# Patient Record
Sex: Male | Born: 1950 | Race: White | Hispanic: No | Marital: Married | Smoking: Never smoker
Health system: Southern US, Community
[De-identification: ages and names within clinical notes are randomized; demographics above are authoritative.]

## PROBLEM LIST (undated history)

## (undated) DIAGNOSIS — E785 Hyperlipidemia, unspecified: Secondary | ICD-10-CM

## (undated) DIAGNOSIS — E119 Type 2 diabetes mellitus without complications: Secondary | ICD-10-CM

## (undated) DIAGNOSIS — H269 Unspecified cataract: Secondary | ICD-10-CM

## (undated) DIAGNOSIS — K219 Gastro-esophageal reflux disease without esophagitis: Secondary | ICD-10-CM

## (undated) DIAGNOSIS — I2510 Atherosclerotic heart disease of native coronary artery without angina pectoris: Secondary | ICD-10-CM

## (undated) DIAGNOSIS — K222 Esophageal obstruction: Secondary | ICD-10-CM

## (undated) DIAGNOSIS — C801 Malignant (primary) neoplasm, unspecified: Secondary | ICD-10-CM

## (undated) DIAGNOSIS — I10 Essential (primary) hypertension: Secondary | ICD-10-CM

## (undated) DIAGNOSIS — J45909 Unspecified asthma, uncomplicated: Secondary | ICD-10-CM

## (undated) DIAGNOSIS — K228 Other specified diseases of esophagus: Secondary | ICD-10-CM

## (undated) DIAGNOSIS — Z8719 Personal history of other diseases of the digestive system: Secondary | ICD-10-CM

## (undated) HISTORY — DX: Essential (primary) hypertension: I10

## (undated) HISTORY — PX: UPPER GASTROINTESTINAL ENDOSCOPY: SHX188

## (undated) HISTORY — PX: SKIN CANCER EXCISION: SHX779

## (undated) HISTORY — DX: Type 2 diabetes mellitus without complications: E11.9

## (undated) HISTORY — DX: Personal history of other diseases of the digestive system: Z87.19

## (undated) HISTORY — DX: Malignant (primary) neoplasm, unspecified: C80.1

## (undated) HISTORY — DX: Esophageal obstruction: K22.2

## (undated) HISTORY — DX: Atherosclerotic heart disease of native coronary artery without angina pectoris: I25.10

## (undated) HISTORY — DX: Gastro-esophageal reflux disease without esophagitis: K21.9

## (undated) HISTORY — DX: Unspecified asthma, uncomplicated: J45.909

## (undated) HISTORY — PX: WISDOM TOOTH EXTRACTION: SHX21

## (undated) HISTORY — DX: Hyperlipidemia, unspecified: E78.5

## (undated) HISTORY — DX: Unspecified cataract: H26.9

## (undated) HISTORY — PX: HEMORRHOID SURGERY: SHX153

---
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
## 2016-09-26 HISTORY — PX: COLONOSCOPY: SHX174

## 2018-10-16 HISTORY — PX: ESOPHAGOGASTRODUODENOSCOPY: SHX1529

## 2018-10-27 HISTORY — PX: CATARACT EXTRACTION: SUR2

## 2019-04-08 ENCOUNTER — Telehealth: Payer: Self-pay | Admitting: Gastroenterology

## 2019-04-08 NOTE — Telephone Encounter (Signed)
Patient would like to transfer care and establish with our office and requested Dr. Bryan Lemma since he is also the patients wife doctor. Patient moved from [place] and was seen at [facility] and was not satisfied.  Please review recent records and advise for scheduling.

## 2019-04-17 NOTE — Telephone Encounter (Signed)
Dr. Marlynn Perking reviewed records and okay to schedule OV.  Called and spoke with patient and he will call back to schedule.  Records filed in [facility]

## 2019-04-17 NOTE — Telephone Encounter (Signed)
Patient schedule 05/15/2019 2:10p

## 2019-05-14 ENCOUNTER — Telehealth: Payer: Self-pay

## 2019-05-14 NOTE — Telephone Encounter (Signed)
Covid-19 screening questions   Do you now or have you had a fever in the last 14 days? No  Do you have any respiratory symptoms of shortness of breath or cough now or in the last 14 days? No  Do you have any family members or close contacts with diagnosed or suspected Covid-19 in the past 14 days? No  Have you been tested for Covid-19 and found to be positive? No        

## 2019-05-15 ENCOUNTER — Other Ambulatory Visit: Payer: Self-pay

## 2019-05-15 ENCOUNTER — Ambulatory Visit (INDEPENDENT_AMBULATORY_CARE_PROVIDER_SITE_OTHER): Payer: Managed Care, Other (non HMO) | Admitting: Gastroenterology

## 2019-05-15 ENCOUNTER — Encounter: Payer: Self-pay | Admitting: Gastroenterology

## 2019-05-15 VITALS — BP 114/66 | HR 72 | Temp 98.1°F | Ht 71.5 in | Wt 175.5 lb

## 2019-05-15 DIAGNOSIS — R634 Abnormal weight loss: Secondary | ICD-10-CM | POA: Diagnosis not present

## 2019-05-15 DIAGNOSIS — R1319 Other dysphagia: Secondary | ICD-10-CM

## 2019-05-15 DIAGNOSIS — K2100 Gastro-esophageal reflux disease with esophagitis, without bleeding: Secondary | ICD-10-CM

## 2019-05-15 DIAGNOSIS — R1310 Dysphagia, unspecified: Secondary | ICD-10-CM

## 2019-05-15 DIAGNOSIS — R432 Parageusia: Secondary | ICD-10-CM

## 2019-05-15 NOTE — Progress Notes (Signed)
Chief Complaint: GERD w/ Erosive Esophagitis, dysphagia  Referring Provider:     Self  HPI:    Alfred Clay is a 68 y.o. male referred to the Gastroenterology Clinic for evaluation of GERD.  Previously seen by Dr. Army Fossa at [place], last seen in 11/2018, with plan to continue PPI BID, Carafate, repeat EGD to evaluate for mucosal healing of erosive esophagitis/lymphocytic esophagitis.  Held off on initiating budesonide pending repeat endoscopy.  GI history: - EGD (06/2018, Dr. Alessandra Bevels): LA Grade C esophagitis (biopsy: Ulcerated mucosa with reflux changes), non-H. pylori gastritis.  Normal duodenum with normal biopsies. - 06/2017: Started PPI bid -EGD (09/2018, Dr. 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 pylori per patient.  No reports available for review. -Colonoscopy (approximately 02/2018, [place]): Normal per patient  Today, he states he continues to have dysphagia to solids. Has been present for approx 2 years, without improvement with BID PPI/Carafate. Points to anterior neck. Worse with steak, medications. No prior esophageal dilations.   No reflux sxs since starting PPI therapy. Index sxs of HB, regurgitation, chronic cough.   Started metformin 2 years ago with subsequent loss of taste, loss of appetite, with subsequent 130# loss. Has regained some appetite recently with stable weight.   No recent hematochezia, melena, abdominal pain.   Family history notable for mother with CRC in her 22s   Past Medical History:  Diagnosis Date  . Acquired esophageal ring   . Aorto-esophageal fistula   . DM type 2  (diabetes mellitus, type 2) ([place])   . GERD (gastroesophageal reflux disease)   . History of esophagitis   . Hyperlipidemia   . Hypertension      Past Surgical History:  Procedure Laterality Date  . CATARACT EXTRACTION  10/2018  . COLONOSCOPY  2018  . ESOPHAGOGASTRODUODENOSCOPY  10/16/2018  . HEMORRHOID SURGERY     Family History  Problem Relation Age of Onset  . Diabetes Mother   . Colon cancer Mother   . Esophageal cancer Neg Hx    Social History   Tobacco Use  . Smoking status: Never Smoker  . Smokeless tobacco: Never Used  Substance Use Topics  . Alcohol use: Yes    Comment: ocassionally  . Drug use: Not Currently   Current Outpatient Medications  Medication Sig Dispense Refill  . Apoaequorin (PREVAGEN PO) Take 1 tablet by mouth daily.    . metFORMIN (GLUCOPHAGE) 500 MG tablet Take 500 mg by mouth 2 (two) times daily with a meal.    . pantoprazole (PROTONIX) 40 MG tablet 1 tablet 2 (two) times daily.    . sucralfate (CARAFATE) 1 GM/10ML suspension Take 1 g by mouth 2 (two) times daily.     No current facility-administered medications for this visit.    Allergies  Allergen Reactions  . Statins      Review of Systems: All systems reviewed and negative except where noted in HPI.     Physical Exam:    Wt Readings from Last 3 Encounters:  05/15/19 175 lb 8 oz (79.6 kg)    BP 114/66   Pulse 72   Temp 98.1 F (36.7 C)   Ht 5' 11.5" (1.816  m)   Wt 175 lb 8 oz (79.6 kg)   BMI 24.14 kg/m  Constitutional:  Pleasant, in no acute distress. Psychiatric: Normal mood and affect. Behavior is normal. EENT: Pupils normal.  Conjunctivae are normal. No scleral icterus. Neck supple. No cervical LAD. Cardiovascular: Normal rate, regular rhythm. No edema Pulmonary/chest: Effort normal and breath sounds normal. No wheezing, rales or rhonchi. Abdominal: Soft, nondistended, nontender. Bowel sounds active throughout. There are no masses palpable. No hepatomegaly.  Neurological: Alert and oriented to person place and time. Skin: Skin is warm and dry. No rashes noted.   ASSESSMENT AND PLAN;   Alfred Clay is a 68 y.o. male presenting with:  1) Dysphagia 2) GERD with erosive esophagitis 3) Loss of taste  -Resume Protonix 40 mg bid -Resume Carafate for now - Repeat EGD with empiric/directed dilation - Plan for esophageal biopsies to further evaluate for possible Lymphocytic Esophagitis (vs EoE) given prior biopsies - If he does not fact have Lymphocytic Esophagitis, plan to initiate topical steroid therapy - Pending clinical response, can also consider Esophageal Manometry, particularly if ongoing severe esophagitis -Evaluate LES laxity, hiatal hernia at time of EGD - Discussed etiologies for loss of taste, to include uncontrolled reflux.  Metallic taste is a reported ADR of metformin, but loss of taste or weight loss not commonly reported  4) Wt loss: - Weight stable now and otherwise without "B sxs" -Colonoscopy completed in 03/2018 - Pending work-up, can consider cross sectional imaging in the near future   The indications, risks, and benefits of EGD with dilation were explained to the patient in detail. Risks include but are not limited to bleeding, perforation, adverse reaction to medications, and cardiopulmonary compromise. Sequelae include but are not limited to the possibility of surgery, hositalization, and mortality. The patient verbalized understanding and wished to proceed. All questions answered, referred to scheduler. Further recommendations pending results of the exam.     Alfred Pea Yurani Fettes, DO, FACG  05/15/2019, 2:14 PM   No ref. provider found

## 2019-05-15 NOTE — Patient Instructions (Signed)
If you are age 68 or older, your body mass index should be between 23-30. Your Body mass index is 24.14 kg/m. If this is out of the aforementioned range listed, please consider follow up with your Primary Care Provider.  If you are age 57 or younger, your body mass index should be between 19-25. Your Body mass index is 24.14 kg/m. If this is out of the aformentioned range listed, please consider follow up with your Primary Care Provider.   To help prevent the possible spread of infection to our patients, communities, and staff; we will be implementing the following measures:  As of now we are not allowing any visitors/family members to accompany you to any upcoming appointments with [facility]. If you have any concerns about this please contact our office to discuss prior to the appointment.   You have been scheduled for an endoscopy. Please follow written instructions given to you at your visit today. If you use inhalers (even only as needed), please bring them with you on the day of your procedure. Your physician has requested that you go to www.startemmi.com and enter the access code given to you at your visit today. This web site gives a general overview about your procedure. However, you should still follow specific instructions given to you by our office regarding your preparation for the procedure.  Please call our office at [phone] to set up your 3 month follow up visit.  It was a pleasure to see you today!  Vito Cirigliano, D.O.

## 2019-05-17 ENCOUNTER — Telehealth: Payer: Self-pay | Admitting: Gastroenterology

## 2019-05-17 NOTE — Telephone Encounter (Signed)

## 2019-05-20 ENCOUNTER — Ambulatory Visit (AMBULATORY_SURGERY_CENTER): Payer: Managed Care, Other (non HMO) | Admitting: Gastroenterology

## 2019-05-20 ENCOUNTER — Other Ambulatory Visit: Payer: Self-pay

## 2019-05-20 ENCOUNTER — Encounter: Payer: Self-pay | Admitting: Gastroenterology

## 2019-05-20 VITALS — BP 120/72 | HR 56 | Temp 98.0°F | Resp 14 | Ht 71.0 in | Wt 175.0 lb

## 2019-05-20 DIAGNOSIS — K2100 Gastro-esophageal reflux disease with esophagitis, without bleeding: Secondary | ICD-10-CM

## 2019-05-20 DIAGNOSIS — K449 Diaphragmatic hernia without obstruction or gangrene: Secondary | ICD-10-CM | POA: Diagnosis not present

## 2019-05-20 DIAGNOSIS — R1310 Dysphagia, unspecified: Secondary | ICD-10-CM

## 2019-05-20 DIAGNOSIS — K222 Esophageal obstruction: Secondary | ICD-10-CM | POA: Diagnosis not present

## 2019-05-20 MED ORDER — SODIUM CHLORIDE 0.9 % IV SOLN: 500.0000 mL | Freq: Once | INTRAVENOUS | Status: DC

## 2019-05-20 NOTE — Progress Notes (Signed)
PT taken to PACU. Monitors in place. VSS. Report given to RN. 

## 2019-05-20 NOTE — Progress Notes (Signed)
CW vitals JB temp  I have reviewed the patient's medical history in detail and updated the computerized patient record.  No egg or soy allergy

## 2019-05-20 NOTE — Progress Notes (Signed)
Called to room to assist during endoscopic procedure.  Patient ID and intended procedure confirmed with present staff. Received instructions for my participation in the procedure from the performing physician.  

## 2019-05-20 NOTE — Op Note (Signed)
[facility] Patient Name: Alfred Clay Procedure Date: 05/20/2019 8:02 AM MRN: [id number] Endoscopist: Gerrit Heck , MD Age: 68 Referring MD:  Date of Birth: August 08, 1951 Gender: Male Account #: [id number] Procedure:                Upper GI endoscopy Indications:              Dysphagia, Follow-up of reflux esophagitis                           68 yo male with history of GERD with erosive                            esophagitis on prior EGDs at outside facility,                            along with possibel Lymphocytic Esophagitis on                            prior biopsy, presents with ongoing GERD and                            dysphagia despite high dose PPI and Carafate. Medicines:                Monitored Anesthesia Care Procedure:                Pre-Anesthesia Assessment:                           - Prior to the procedure, a History and Physical                            was performed, and patient medications and                            allergies were reviewed. The patient's tolerance of                            previous anesthesia was also reviewed. The risks                            and benefits of the procedure and the sedation                            options and risks were discussed with the patient.                            All questions were answered, and informed consent                            was obtained. Prior Anticoagulants: The patient has                            taken no previous anticoagulant or antiplatelet  agents. ASA Grade Assessment: II - A patient with                            mild systemic disease. After reviewing the risks                            and benefits, the patient was deemed in                            satisfactory condition to undergo the procedure.                           After obtaining informed consent, the endoscope was                            passed under direct vision.  Throughout the                            procedure, the patient's blood pressure, pulse, and                            oxygen saturations were monitored continuously. The                            Endoscope was introduced through the mouth, and                            advanced to the second part of duodenum. The upper                            GI endoscopy was accomplished without difficulty.                            The patient tolerated the procedure well. Scope In: Scope Out: Findings:                 Two benign-appearing, intrinsic moderate stenoses                            were found 18 cm from the incisors and at the GEJ                            at 37 cm from the incisors. The narrowest stenosis                            measured 8 mm (inner diameter), located 18 cm from                            the incisors. The stenoses were traversed. A TTS                            dilator was passed through the scope. Dilation with  an 8.5-9.5-10.5 mm balloon and an 08-07-12 mm                            balloon dilator was performed to 13 mm with                            appropriate mucosal rent. The dilation site was                            examined and showed mild mucosal disruption.                            Estimated blood loss was minimal.                           Moderately severe esophagitis with mucosal                            sloughing and friability with no bleeding was found                            in the entire esophagus. Biopsies were taken in the                            distal esophagus with a cold forceps for histology.                            Due to the friability, additioanl proximal biopsies                            were not performed today. Estimated blood loss was                            minimal.                           Moderately severe esophagitis caharacterized by                            edema and  erythema with no bleeding was found at                            the gastroesophageal junction. Biopsies were taken                            with a cold forceps for histology. Estimated blood                            loss was minimal.                           A 3 cm hiatal hernia was present.  The gastroesophageal flap valve was visualized                            endoscopically and classified as Hill Grade III                            (minimal fold, loose to endoscope, hiatal hernia                            likely).                           The entire examined stomach was normal.                           The duodenal bulb, first portion of the duodenum                            and second portion of the duodenum were normal.                            Biopsies were taken with a cold forceps for                            histology. Estimated blood loss was minimal. Complications:            No immediate complications. Estimated Blood Loss:     Estimated blood loss was minimal. Impression:               - Benign-appearing esophageal stenoses. Dilated.                           - Moderately severe esophagitis. Biopsied.                           - Moderately severe esophagitis. Biopsied.                           - 3 cm hiatal hernia.                           - Gastroesophageal flap valve classified as Hill                            Grade III (minimal fold, loose to endoscope, hiatal                            hernia likely).                           - Normal stomach.                           - Normal duodenal bulb, first portion of the                            duodenum and  second portion of the duodenum.                            Biopsied. Recommendation:           - Patient has a contact number available for                            emergencies. The signs and symptoms of potential                            delayed complications were  discussed with the                            patient. Return to normal activities tomorrow.                            Written discharge instructions were provided to the                            patient.                           - Soft diet today.                           - Continue present medications.                           - Await pathology results.                           - Repeat upper endoscopy in 4 weeks for retreatment.                           - Return to GI clinic at appointment to be                            scheduled. Gerrit Heck, MD 05/20/2019 8:43:34 AM

## 2019-05-20 NOTE — Patient Instructions (Signed)
Handouts given for esophagitis, stricture, hiatal hernia, and Post-Dilation diet.  Return for upper endoscopy in 4 weeks.   YOU HAD AN ENDOSCOPIC PROCEDURE TODAY AT [facility]:   Refer to the procedure report that was given to you for any specific questions about what was found during the examination.  If the procedure report does not answer your questions, please call your gastroenterologist to clarify.  If you requested that your care partner not be given the details of your procedure findings, then the procedure report has been included in a sealed envelope for you to review at your convenience later.  YOU SHOULD EXPECT: Some feelings of bloating in the abdomen. Passage of more gas than usual.  Walking can help get rid of the air that was put into your GI tract during the procedure and reduce the bloating. If you had a lower endoscopy (such as a colonoscopy or flexible sigmoidoscopy) you may notice spotting of blood in your stool or on the toilet paper. If you underwent a bowel prep for your procedure, you may not have a normal bowel movement for a few days.  Please Note:  You might notice some irritation and congestion in your nose or some drainage.  This is from the oxygen used during your procedure.  There is no need for concern and it should clear up in a day or so.  SYMPTOMS TO REPORT IMMEDIATELY:   Following upper endoscopy (EGD)  Vomiting of blood or coffee ground material  New chest pain or pain under the shoulder blades  Painful or persistently difficult swallowing  New shortness of breath  Fever of 100F or higher  Black, tarry-looking stools  For urgent or emergent issues, a gastroenterologist can be reached at any hour by calling [phone].   DIET: SEE POST-DILATION DIET HANDOUT.  NPO UNTIL 9:30, CLEAR LIQUIDS UNTIL 10:30 AM THEN SOFT DIET FOR 24 HOURS.   We do recommend a small meal at first, but then you may proceed to your regular diet TOMORROW AFTER  BREAKFAST.  Drink plenty of fluids but you should avoid alcoholic beverages for 24 hours.  ACTIVITY:  You should plan to take it easy for the rest of today and you should NOT DRIVE or use heavy machinery until tomorrow (because of the sedation medicines used during the test).    FOLLOW UP: Our staff will call the number listed on your records 48-72 hours following your procedure to check on you and address any questions or concerns that you may have regarding the information given to you following your procedure. If we do not reach you, we will leave a message.  We will attempt to reach you two times.  During this call, we will ask if you have developed any symptoms of COVID 19. If you develop any symptoms (ie: fever, flu-like symptoms, shortness of breath, cough etc.) before then, please call [phone].  If you test positive for Covid 19 in the 2 weeks post procedure, please call and report this information to [place].    If any biopsies were taken you will be contacted by phone or by letter within the next 1-3 weeks.  Please call us at [phone] if you have not heard about the biopsies in 3 weeks.    SIGNATURES/CONFIDENTIALITY: You and/or your care partner have signed paperwork which will be entered into your electronic medical record.  These signatures attest to the fact that that the information above on your After Visit Summary has been  reviewed and is understood.  Full responsibility of the confidentiality of this discharge information lies with you and/or your care-partner. 

## 2019-05-22 ENCOUNTER — Telehealth: Payer: Self-pay

## 2019-05-22 NOTE — Telephone Encounter (Signed)
  Follow up Call-  Call back number 05/20/2019  Post procedure Call Back phone  # [phone]  Permission to leave phone message Yes     Patient questions:  Do you have a fever, pain , or abdominal swelling? No. Pain Score  0 *  Have you tolerated food without any problems? Yes.    Have you been able to return to your normal activities? Yes.    Do you have any questions about your discharge instructions: Diet   No. Medications  No. Follow up visit  No.  Do you have questions or concerns about your Care? No.  Actions: * If pain score is 4 or above: No action needed, pain <4. 1. Have you developed a fever since your procedure? no  2.   Have you had an respiratory symptoms (SOB or cough) since your procedure? no  3.   Have you tested positive for COVID 19 since your procedure no  4.   Have you had any family members/close contacts diagnosed with the COVID 19 since your procedure?  no   If yes to any of these questions please route to Joylene John, RN and Alphonsa Gin, Therapist, sports.

## 2019-05-31 ENCOUNTER — Encounter: Payer: Self-pay | Admitting: Gastroenterology

## 2019-06-12 ENCOUNTER — Telehealth: Payer: Self-pay

## 2019-06-12 NOTE — Telephone Encounter (Signed)
Covid-19 screening questions   Do you now or have you had a fever in the last 14 days? NO   Do you have any respiratory symptoms of shortness of breath or cough now or in the last 14 days? NO  Do you have any family members or close contacts with diagnosed or suspected Covid-19 in the past 14 days? NO  Have you been tested for Covid-19 and found to be positive? NO        

## 2019-06-13 ENCOUNTER — Encounter: Payer: Self-pay | Admitting: Gastroenterology

## 2019-06-13 ENCOUNTER — Other Ambulatory Visit: Payer: Self-pay

## 2019-06-13 ENCOUNTER — Encounter: Payer: Managed Care, Other (non HMO) | Admitting: Gastroenterology

## 2019-06-13 ENCOUNTER — Ambulatory Visit (AMBULATORY_SURGERY_CENTER): Payer: Managed Care, Other (non HMO) | Admitting: Gastroenterology

## 2019-06-13 VITALS — BP 120/66 | HR 56 | Temp 98.2°F | Resp 9 | Ht 71.0 in | Wt 175.0 lb

## 2019-06-13 DIAGNOSIS — K222 Esophageal obstruction: Secondary | ICD-10-CM

## 2019-06-13 DIAGNOSIS — K210 Gastro-esophageal reflux disease with esophagitis: Secondary | ICD-10-CM

## 2019-06-13 DIAGNOSIS — R1310 Dysphagia, unspecified: Secondary | ICD-10-CM

## 2019-06-13 MED ORDER — SODIUM CHLORIDE 0.9 % IV SOLN: 500.0000 mL | Freq: Once | INTRAVENOUS | Status: DC

## 2019-06-13 NOTE — Progress Notes (Signed)
PT taken to PACU. Monitors in place. VSS. Report given to RN. 

## 2019-06-13 NOTE — Patient Instructions (Signed)
Continue Soft Diet - see handout.  Repeat endoscopy at appointment to be scheduled at [facility] endoscopy.  YOU HAD AN ENDOSCOPIC PROCEDURE TODAY AT [facility]:   Refer to the procedure report that was given to you for any specific questions about what was found during the examination.  If the procedure report does not answer your questions, please call your gastroenterologist to clarify.  If you requested that your care partner not be given the details of your procedure findings, then the procedure report has been included in a sealed envelope for you to review at your convenience later.  YOU SHOULD EXPECT: Some feelings of bloating in the abdomen. Passage of more gas than usual.  Walking can help get rid of the air that was put into your GI tract during the procedure and reduce the bloating. If you had a lower endoscopy (such as a colonoscopy or flexible sigmoidoscopy) you may notice spotting of blood in your stool or on the toilet paper. If you underwent a bowel prep for your procedure, you may not have a normal bowel movement for a few days.  Please Note:  You might notice some irritation and congestion in your nose or some drainage.  This is from the oxygen used during your procedure.  There is no need for concern and it should clear up in a day or so.  SYMPTOMS TO REPORT IMMEDIATELY:    Following upper endoscopy (EGD)  Vomiting of blood or coffee ground material  New chest pain or pain under the shoulder blades  Painful or persistently difficult swallowing  New shortness of breath  Fever of 100F or higher  Black, tarry-looking stools  For urgent or emergent issues, a gastroenterologist can be reached at any hour by calling [phone].   DIET:  We do recommend a small meal at first, but then you may proceed to your regular diet.  Drink plenty of fluids but you should avoid alcoholic beverages for 24 hours.  ACTIVITY:  You should plan to take it easy for the  rest of today and you should NOT DRIVE or use heavy machinery until tomorrow (because of the sedation medicines used during the test).    FOLLOW UP: Our staff will call the number listed on your records 48-72 hours following your procedure to check on you and address any questions or concerns that you may have regarding the information given to you following your procedure. If we do not reach you, we will leave a message.  We will attempt to reach you two times.  During this call, we will ask if you have developed any symptoms of COVID 19. If you develop any symptoms (ie: fever, flu-like symptoms, shortness of breath, cough etc.) before then, please call [phone].  If you test positive for Covid 19 in the 2 weeks post procedure, please call and report this information to [place].    If any biopsies were taken you will be contacted by phone or by letter within the next 1-3 weeks.  Please call us at [phone] if you have not heard about the biopsies in 3 weeks.    SIGNATURES/CONFIDENTIALITY: You and/or your care partner have signed paperwork which will be entered into your electronic medical record.  These signatures attest to the fact that that the information above on your After Visit Summary has been reviewed and is understood.  Full responsibility of the confidentiality of this discharge information lies with you and/or your care-partner.

## 2019-06-13 NOTE — Op Note (Signed)
Alfred Clay: Alfred Clay Procedure Date: 06/13/2019 8:40 AM MRN: [id number] Endoscopist: Mauri Pole , MD Age: 68 Referring MD:  Date of Birth: 06/01/1951 Gender: Male Account #: [id number] Procedure:                Upper GI endoscopy Indications:              Dysphagia, Follow-up of reflux esophagitis Medicines:                Monitored Anesthesia Care Procedure:                Pre-Anesthesia Assessment:                           - Prior to the procedure, a History and Physical                            was performed, and patient medications and                            allergies were reviewed. The patient's tolerance of                            previous anesthesia was also reviewed. The risks                            and benefits of the procedure and the sedation                            options and risks were discussed with the patient.                            All questions were answered, and informed consent                            was obtained. Prior Anticoagulants: The patient has                            taken no previous anticoagulant or antiplatelet                            agents. ASA Grade Assessment: II - A patient with                            mild systemic disease. After reviewing the risks                            and benefits, the patient was deemed in                            satisfactory condition to undergo the procedure.                           After obtaining informed consent, the endoscope was  passed under direct vision. Throughout the                            procedure, the patient's blood pressure, pulse, and                            oxygen saturations were monitored continuously. The                            Endoscope was introduced through the mouth, with                            the intention of advancing to the esophagus. The                            scope was  advanced to the upper third of the                            esophagus before the procedure was aborted.                            Medications were given. The upper GI endoscopy was                            technically difficult and complex due to narrowing.                            The procedure was aborted due to stenosis. The                            patient tolerated the procedure well. Scope In: Scope Out: Findings:                 LA Grade C (one or more mucosal breaks continuous                            between tops of 2 or more mucosal folds, less than                            75% circumference) esophagitis was found 20 to 22                            cm from the incisors.                           One benign-appearing, intrinsic severe (stenosis;                            an endoscope cannot pass) stenosis was found 22 to                            23 cm from the incisors. This stenosis measured 8  mm (inner diameter). The stenosis was not                            traversed. Procedure was aborted Complications:            No immediate complications. Estimated Blood Loss:     Estimated blood loss was minimal. Impression:               - The procedure was aborted due to stenosis.                           - LA Grade C esophagitis.                           - Benign-appearing esophageal stenosis.                           - No specimens collected. Recommendation:           - Patient has a contact number available for                            emergencies. The signs and symptoms of potential                            delayed complications were discussed with the                            patient. Return to normal activities tomorrow.                            Written discharge instructions were provided to the                            patient.                           - Soft diet.                           - Continue present  medications.                           - Repeat upper endoscopy at appointment to be                            scheduled at [facility] endoscopy (Dr Bryan Lemma) for                            dilation of severe proximal esophageal stricture                            under general anesthesia and will need pediatric                            endoscope . Mauri Pole, MD 06/13/2019 9:04:07 AM This report has been signed electronically.

## 2019-06-17 ENCOUNTER — Telehealth: Payer: Self-pay | Admitting: *Deleted

## 2019-06-17 ENCOUNTER — Telehealth: Payer: Self-pay

## 2019-06-17 NOTE — Telephone Encounter (Signed)
Telephone # given is inaccurate. Unable to complete

## 2019-06-17 NOTE — Telephone Encounter (Signed)
  Follow up Call-  Call back number 06/13/2019 05/20/2019  Post procedure Call Back phone  # [phone]  Permission to leave phone message Yes Yes     Patient questions:  Do you have a fever, pain , or abdominal swelling? No. Pain Score  0 *  Have you tolerated food without any problems? Yes.    Have you been able to return to your normal activities? Yes.    Do you have any questions about your discharge instructions: Diet   No. Medications  No. Follow up visit  No.  Do you have questions or concerns about your Care? No.  Actions: * If pain score is 4 or above: No action needed, pain <4.  1. Have you developed a fever since your procedure? no  2.   Have you had an respiratory symptoms (SOB or cough) since your procedure? no  3.   Have you tested positive for COVID 19 since your procedure no  4.   Have you had any family members/close contacts diagnosed with the COVID 19 since your procedure?  no   If yes to any of these questions please route to Joylene John, RN and Alphonsa Gin, Therapist, sports.

## 2019-06-21 ENCOUNTER — Ambulatory Visit: Payer: Managed Care, Other (non HMO) | Admitting: Gastroenterology

## 2019-07-03 ENCOUNTER — Other Ambulatory Visit: Payer: Self-pay

## 2019-07-03 ENCOUNTER — Encounter: Payer: Self-pay | Admitting: Gastroenterology

## 2019-07-03 ENCOUNTER — Ambulatory Visit (INDEPENDENT_AMBULATORY_CARE_PROVIDER_SITE_OTHER): Payer: Managed Care, Other (non HMO) | Admitting: Gastroenterology

## 2019-07-03 VITALS — BP 120/64 | HR 66 | Ht 71.0 in | Wt 177.0 lb

## 2019-07-03 DIAGNOSIS — K449 Diaphragmatic hernia without obstruction or gangrene: Secondary | ICD-10-CM | POA: Diagnosis not present

## 2019-07-03 DIAGNOSIS — R1310 Dysphagia, unspecified: Secondary | ICD-10-CM

## 2019-07-03 DIAGNOSIS — K2100 Gastro-esophageal reflux disease with esophagitis, without bleeding: Secondary | ICD-10-CM

## 2019-07-03 DIAGNOSIS — K222 Esophageal obstruction: Secondary | ICD-10-CM

## 2019-07-03 DIAGNOSIS — R634 Abnormal weight loss: Secondary | ICD-10-CM

## 2019-07-03 MED ORDER — PANTOPRAZOLE SODIUM 40 MG PO TBEC: 40.0000 mg | DELAYED_RELEASE_TABLET | Freq: Two times a day (BID) | ORAL | 5 refills | Status: DC

## 2019-07-03 MED ORDER — SUCRALFATE 1 GM/10ML PO SUSP: 1.0000 g | Freq: Four times a day (QID) | ORAL | 1 refills | Status: DC

## 2019-07-03 NOTE — Patient Instructions (Signed)
If you are age 68 or older, your body mass index should be between 23-30. Your Body mass index is 24.69 kg/m. If this is out of the aforementioned range listed, please consider follow up with your Primary Care Provider.  If you are age 56 or younger, your body mass index should be between 19-25. Your Body mass index is 24.69 kg/m. If this is out of the aformentioned range listed, please consider follow up with your Primary Care Provider.   To help prevent the possible spread of infection to our patients, communities, and staff; we will be implementing the following measures:  As of now we are not allowing any visitors/family members to accompany you to any upcoming appointments with [facility]. If you have any concerns about this please contact our office to discuss prior to the appointment.   It was a pleasure to see you today!  Vito Cirigliano, D.O.

## 2019-07-03 NOTE — Progress Notes (Signed)
P  Chief Complaint:     Dysphagia, esophagitis, esophageal stenosis  GI History: 68 year old male with history of progressive dysphagia to solids over the last 2 years, without improvement with PPI, Carafate.  Recent EGDs with severe proximal esophageal stenosis and esophagitis.  Biopsies were concerning for lymphocytic esophagitis in 09/2018, and more recently suspicious for lichenoid esophagitis.   Endoscopic history: - EGD (05/2019, Dr. Silverio Decamp): LA Grade C esophagitis 20-22 cm from incisors, severe stenosis at 22 cm 8 mm diameter, not traversable.  Recommended repeat at [facility] with pediatric endoscope -EGD (04/2019, Dr. Bryan Lemma): 2 stenoses located in proximal esophagus (18 cm from incisors) and GE J (37 cm from incisors).  Dilated with 13 mm TTS balloon with mucosal rent.  Moderately severe esophagitis mucosal friability throughout esophagus.  Biopsies suggestive of lichenoid esophagitis.  No evidence of EOE or lymphocytic esophagitis.  3 cm HH.  Normal stomach and duodenum.  Recommended repeat 4 weeks - EGD (06/2018, Dr. Alessandra Bevels): LA Grade C esophagitis (biopsy: Ulcerated mucosa with reflux changes), non-H. pylori gastritis.  Normal duodenum with normal biopsies. - 06/2017: Started PPI bid -EGD (09/2018, Dr. 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 pylori per patient.  No reports available for review. -Colonoscopy (approximately 02/2018, [place]): Normal per patient  HPI:     Patient is a 68 y.o. male presenting to the Gastroenterology Clinic for follow-up.  Last seen by me in 04/2019, with EGD x2 since then, most recently with Dr.  Silverio Decamp 2 weeks ago, notable for non-traversable esophageal stenosis.  Today states he contineus to have dysphagia, albeit much improved since his EGD with dilation in 04/2019.  Continues to tolerate p.o. intake.  Does have to cut meat into small pieces and chew very thoroughly.  Also crushing his metformin.  Has actually gained a couple pounds since his EGD in 04/2019 due to improved p.o. tolerance.  Reflux symptoms.  Needs refill of Protonix 40 mg PO BID and Carafate. Tolerating meds without issue.    Review of systems:     No chest pain, no SOB, no fevers, no urinary sx   Past Medical History:  Diagnosis Date  . Acquired esophageal ring   . Aorto-esophageal fistula   . Cancer ([place])    skin  . Cataract    bilateral-removed  . DM type 2 (diabetes mellitus, type 2) ([place])   . GERD (gastroesophageal reflux disease)   . History of esophagitis   . Hyperlipidemia   . Hypertension     Patient's surgical history, family medical history, social history, medications and allergies were all reviewed in Epic    Current Outpatient Medications  Medication Sig Dispense Refill  . metFORMIN (GLUCOPHAGE) 500 MG tablet Take 500 mg by mouth 2 (two) times daily with a meal.    . pantoprazole (PROTONIX) 40 MG tablet 1 tablet 2 (two) times daily.    . sucralfate (CARAFATE) 1 GM/10ML suspension Take 1 g by mouth 2 (two) times daily.     No current facility-administered medications for this visit.     Physical Exam:     BP 120/64   Pulse 66   Ht 5\' 11"  (1.803 m)   Wt 177 lb (80.3 kg)   BMI 24.69 kg/m  GENERAL:  Pleasant male in NAD PSYCH: : Cooperative, normal affect EENT:  conjunctiva pink, mucous membranes moist, neck supple without masses CARDIAC:  RRR, no murmur heard, no peripheral edema PULM: Normal respiratory effort, lungs CTA bilaterally, no wheezing ABDOMEN:  Nondistended, soft, nontender. No obvious masses, no hepatomegaly,  normal bowel sounds SKIN:  turgor, no lesions seen  Musculoskeletal:  Normal muscle tone, normal strength NEURO: Alert and oriented x 3, no focal neurologic deficits   IMPRESSION and PLAN:    1) Esophageal stenosis 2) Dysphagia  -Repeat EGD with dilation to be scheduled at [facility] given possible need for pediatric endoscope and intubation for procedure -Continue PPI and Carafate.  Refills placed today - Advised patient to continue to cut food into small pieces, eat small bites, chew food thoroughly and with plenty of liquids to avoid food impaction. -Suspect he will need short interval repeat endoscopy for serial dilations - Plan for repeat esophageal biopsies  3) GERD with Erosive Esophagitis -Resume PPI as above -Evaluate for endoscopic improvement at time of repeat endoscopy  4) Weight loss -Stable and has actually increased by a couple of pounds due to improved p.o. intake since EGD with dilation  The indications, risks, and benefits of EGD were explained to the patient in detail. Risks include but are not limited to bleeding, perforation, adverse reaction to medications, and cardiopulmonary compromise. Sequelae include but are not limited to the possibility of surgery, hositalization, and mortality. The patient verbalized understanding and wished to proceed. All questions answered, referred to scheduler. Further recommendations pending results of the exam.          Lavena Bullion ,DO, FACG 07/03/2019, 2:59 PM

## 2019-07-31 ENCOUNTER — Telehealth: Payer: Self-pay | Admitting: Gastroenterology

## 2019-07-31 NOTE — Telephone Encounter (Signed)
Please review and advise-patient was supposed to have been scheduled for a COVID screening when he was scheduled for the procedure-if he gets screened and tests negative can he still proceed with the [facility] case?

## 2019-07-31 NOTE — Telephone Encounter (Signed)
Based on the timing of his potential exposure, I recommend initiating quarantine now, go for COVID testing, and contacting his PCM for further instruction.   If negative now and able to quarantine, but still wanting to proceed with procedure, I would actually still plan for repeat testing in the pre-operative time as previously scheduled to ensure he is negative. If negative again on repeat testing, ok to proceed.   If his preference is for testing, quarantine, and just rescheduling procedures for a later date, that is reasonable and will accommodate accordingly. Please keep me posted on his decision. Thanks.

## 2019-07-31 NOTE — Progress Notes (Signed)
Due to recent COVID-19 restrictions implemented by our local and state authorities and in an effort to keep both patients and staff as safe as possible, our hospital system now requires COVID-19 testing prior to any scheduled hospital procedure. Please go to our [facility] location drive thru testing site ([address], [place], [place] [address]) on 08/03/2019 at 11:15am. There will be multiple testing areas, the first checkpoint being for pre-procedure/surgery testing. Get into the right (yellow) lane that leads to the PAT testing team. You will not be billed at the time of testing but may receive a bill later depending on your insurance. The approximate cost of the test is $100. You must agree to quarantine from the time of your testing until the procedure date on 08/06/2019. This should include staying at home with ONLY the people you live with. Avoid take-out, grocery store shopping or leaving the house for any non-emergent reason. Failure to have your COVID-19 test done on the date and time you have been scheduled will result in cancellation of procedure. Please call our office at [phone] if you have any questions.  Called and spoke with patient-patient scheduled for COVID screening prior to procedure at [facility] on 08/06/2019; RN read above paragraph to patient and had patient re teach for verification of understanding of information and instructions; Patient advised to call back to the office at [phone] should questions/concerns arise;  Patient verbalized understanding of information/instructions;

## 2019-07-31 NOTE — Telephone Encounter (Signed)
Please see additional documentation as patient was given this information as well as other notation concerning COVID screening;

## 2019-08-03 ENCOUNTER — Other Ambulatory Visit (HOSPITAL_COMMUNITY)
Admission: RE | Admit: 2019-08-03 | Discharge: 2019-08-03 | Disposition: A | Discharge status: Home / Self Care | Payer: Managed Care, Other (non HMO) | Source: Ambulatory Visit | Attending: Gastroenterology | Admitting: Gastroenterology

## 2019-08-03 DIAGNOSIS — Z20828 Contact with and (suspected) exposure to other viral communicable diseases: Secondary | ICD-10-CM | POA: Insufficient documentation

## 2019-08-03 DIAGNOSIS — Z01812 Encounter for preprocedural laboratory examination: Secondary | ICD-10-CM | POA: Diagnosis not present

## 2019-08-03 LAB — SARS CORONAVIRUS 2 (TAT 6-24 HRS): SARS Coronavirus 2: NEGATIVE

## 2019-08-05 ENCOUNTER — Other Ambulatory Visit: Payer: Self-pay

## 2019-08-06 ENCOUNTER — Encounter (HOSPITAL_COMMUNITY)
Admission: RE | Disposition: A | Discharge status: Home / Self Care | Payer: Self-pay | Source: Home / Self Care | Attending: Gastroenterology

## 2019-08-06 ENCOUNTER — Ambulatory Visit (HOSPITAL_COMMUNITY): Payer: Managed Care, Other (non HMO) | Admitting: Anesthesiology

## 2019-08-06 ENCOUNTER — Other Ambulatory Visit: Payer: Self-pay | Admitting: Gastroenterology

## 2019-08-06 ENCOUNTER — Ambulatory Visit (HOSPITAL_COMMUNITY)
Admission: RE | Admit: 2019-08-06 | Discharge: 2019-08-06 | Disposition: A | Discharge status: Home / Self Care | Payer: Managed Care, Other (non HMO) | Attending: Gastroenterology | Admitting: Gastroenterology

## 2019-08-06 ENCOUNTER — Encounter (HOSPITAL_COMMUNITY): Payer: Self-pay

## 2019-08-06 ENCOUNTER — Other Ambulatory Visit: Payer: Self-pay

## 2019-08-06 DIAGNOSIS — K222 Esophageal obstruction: Secondary | ICD-10-CM

## 2019-08-06 DIAGNOSIS — R1319 Other dysphagia: Secondary | ICD-10-CM

## 2019-08-06 DIAGNOSIS — R1310 Dysphagia, unspecified: Secondary | ICD-10-CM | POA: Diagnosis not present

## 2019-08-06 DIAGNOSIS — Z1159 Encounter for screening for other viral diseases: Secondary | ICD-10-CM

## 2019-08-06 DIAGNOSIS — Z7984 Long term (current) use of oral hypoglycemic drugs: Secondary | ICD-10-CM | POA: Diagnosis not present

## 2019-08-06 DIAGNOSIS — K2100 Gastro-esophageal reflux disease with esophagitis, without bleeding: Secondary | ICD-10-CM

## 2019-08-06 DIAGNOSIS — I10 Essential (primary) hypertension: Secondary | ICD-10-CM | POA: Insufficient documentation

## 2019-08-06 DIAGNOSIS — E118 Type 2 diabetes mellitus with unspecified complications: Secondary | ICD-10-CM | POA: Diagnosis not present

## 2019-08-06 DIAGNOSIS — R634 Abnormal weight loss: Secondary | ICD-10-CM | POA: Insufficient documentation

## 2019-08-06 DIAGNOSIS — E785 Hyperlipidemia, unspecified: Secondary | ICD-10-CM | POA: Insufficient documentation

## 2019-08-06 DIAGNOSIS — K2210 Ulcer of esophagus without bleeding: Secondary | ICD-10-CM | POA: Insufficient documentation

## 2019-08-06 HISTORY — PX: ESOPHAGOGASTRODUODENOSCOPY (EGD) WITH PROPOFOL: SHX5813

## 2019-08-06 HISTORY — PX: BIOPSY: SHX5522

## 2019-08-06 HISTORY — PX: SAVORY DILATION: SHX5439

## 2019-08-06 LAB — GLUCOSE, CAPILLARY: Glucose-Capillary: 100 mg/dL — ABNORMAL HIGH (ref 70–99)

## 2019-08-06 SURGERY — ESOPHAGOGASTRODUODENOSCOPY (EGD) WITH PROPOFOL
Anesthesia: Monitor Anesthesia Care

## 2019-08-06 MED ORDER — PROPOFOL 10 MG/ML IV BOLUS
INTRAVENOUS | Status: DC | PRN
  Administered 2019-08-06 (×2): 20 mg via INTRAVENOUS

## 2019-08-06 MED ORDER — PROPOFOL 500 MG/50ML IV EMUL
INTRAVENOUS | Status: DC | PRN
  Administered 2019-08-06: 150 ug/kg/min via INTRAVENOUS

## 2019-08-06 MED ORDER — SODIUM CHLORIDE 0.9 % IV SOLN: INTRAVENOUS | Status: DC

## 2019-08-06 MED ORDER — PROPOFOL 500 MG/50ML IV EMUL
INTRAVENOUS | Status: AC
  Filled 2019-08-06: qty 50

## 2019-08-06 MED ORDER — LACTATED RINGERS IV SOLN
INTRAVENOUS | Status: DC
  Administered 2019-08-06: 09:00:00 via INTRAVENOUS

## 2019-08-06 MED ORDER — LIDOCAINE 2% (20 MG/ML) 5 ML SYRINGE
INTRAMUSCULAR | Status: DC | PRN
  Administered 2019-08-06: 80 mg via INTRAVENOUS

## 2019-08-06 SURGICAL SUPPLY — 14 items

## 2019-08-06 NOTE — Interval H&P Note (Signed)
History and Physical Interval Note:  08/06/2019 9:14 AM  Alfred Clay  has presented today for surgery, with the diagnosis of dysphagia, esophageal stenosis.  The various methods of treatment have been discussed with the patient and family. After consideration of risks, benefits and other options for treatment, the patient has consented to  Procedure(s) with comments: ESOPHAGOGASTRODUODENOSCOPY (EGD) WITH PROPOFOL (N/A) - with dilation using ped scope as a surgical intervention.  The patient's history has been reviewed, patient examined, no change in status, stable for surgery.  I have reviewed the patient's chart and labs.  Questions were answered to the patient's satisfaction.     [name] [place] Barg[place]

## 2019-08-06 NOTE — Transfer of Care (Signed)
Immediate Anesthesia Transfer of Care Note  Patient: Alfred Clay  Procedure(s) Performed: ESOPHAGOGASTRODUODENOSCOPY (EGD) WITH PROPOFOL (N/A ) BIOPSY SAVORY DILATION (N/A )  Patient Location: Endoscopy Unit  Anesthesia Type:MAC  Level of Consciousness: drowsy and responds to stimulation  Airway & Oxygen Therapy: Patient Spontanous Breathing and Patient connected to face mask oxygen  Post-op Assessment: Report given to RN, Post -op Vital signs reviewed and stable and Patient moving all extremities  Post vital signs: Reviewed and stable  Last Vitals:  Vitals Value Taken Time  BP    Temp    Pulse 52 08/06/19 0957  Resp 11 08/06/19 0957  SpO2 100 % 08/06/19 0957  Vitals shown include unvalidated device data.  Last Pain:  Vitals:   08/06/19 0817  TempSrc: Oral  PainSc: 0-No pain         Complications: No apparent anesthesia complications

## 2019-08-06 NOTE — Discharge Instructions (Signed)
YOU HAD AN ENDOSCOPIC PROCEDURE TODAY: Refer to the procedure report and other information in the discharge instructions given to you for any specific questions about what was found during the examination. If this information does not answer your questions, please call [place] office at [phone] to clarify.  ° °YOU SHOULD EXPECT: Some feelings of bloating in the abdomen. Passage of more gas than usual. Walking can help get rid of the air that was put into your GI tract during the procedure and reduce the bloating.. ° °DIET: Your first meal following the procedure should be a light meal and then it is ok to progress to your normal diet. A half-sandwich or bowl of soup is an example of a good first meal. Heavy or fried foods are harder to digest and may make you feel nauseous or bloated. Drink plenty of fluids but you should avoid alcoholic beverages for 24 hours. If you had a esophageal dilation, please see attached instructions for diet.   ° °ACTIVITY: Your care partner should take you home directly after the procedure. You should plan to take it easy, moving slowly for the rest of the day. You can resume normal activity the day after the procedure however YOU SHOULD NOT DRIVE, use power tools, machinery or perform tasks that involve climbing or major physical exertion for 24 hours (because of the sedation medicines used during the test).  ° °SYMPTOMS TO REPORT IMMEDIATELY: °A gastroenterologist can be reached at any hour. Please call [phone]  for any of the following symptoms:  ° °Following upper endoscopy (EGD, EUS, ERCP, esophageal dilation) °Vomiting of blood or coffee ground material  °New, significant abdominal pain  °New, significant chest pain or pain under the shoulder blades  °Painful or persistently difficult swallowing  °New shortness of breath  °Black, tarry-looking or red, bloody stools ° °FOLLOW UP:  °If any biopsies were taken you will be contacted by phone or by letter within the next 1-3  weeks. Call [phone]  if you have not heard about the biopsies in 3 weeks.  °Please also call with any specific questions about appointments or follow up tests. ° °

## 2019-08-06 NOTE — H&P (Signed)
P  Chief Complaint:     Dysphagia, esophagitis, esophageal stenosis  GI History: 68 year old male with history of progressive dysphagia to solids over the last 2 years, without improvement with PPI, Carafate.  Recent EGDs with severe proximal esophageal stenosis and esophagitis.  Biopsies were concerning for lymphocytic esophagitis in 09/2018, and more recently suspicious for lichenoid esophagitis.   Endoscopic history: - EGD (05/2019, Dr. Silverio Decamp): LA Grade C esophagitis 20-22 cm from incisors, severe stenosis at 22 cm 8 mm diameter, not traversable.  Recommended repeat at [facility] with pediatric endoscope -EGD (04/2019, Dr. Bryan Lemma): 2 stenoses located in proximal esophagus (18 cm from incisors) and GE J (37 cm from incisors).  Dilated with 13 mm TTS balloon with mucosal rent.  Moderately severe esophagitis mucosal friability throughout esophagus.  Biopsies suggestive of lichenoid esophagitis.  No evidence of EOE or lymphocytic esophagitis.  3 cm HH.  Normal stomach and duodenum.  Recommended repeat 4 weeks -EGD (06/2018, Dr. Alessandra Bevels): LA Grade C esophagitis (biopsy: Ulcerated mucosa with reflux changes), non-H. pylori gastritis. Normal duodenum with normal biopsies. -06/2017: Started PPI bid -EGD (09/2018, Dr. Alessandra Bevels): LA Grade C esophagitis, a few benign-appearing intrinsic moderate stenoses (biopsy: Increased lymphocytes concerning for lymphocytic esophagitis) -09/2018: ContinuedbidPPI, added Carafate with resolution of reflux symptoms -Previously followed in [place] with EGD approximately7/2019 (GERD, dysphagia, odynophagia) with Candida Esophagitismay have shown fungal infection and H. pylori per patient. No reports available for review. -Colonoscopy (approximately 02/2018, [place]): Normal per patient  HPI:     Patient is a 68 y.o. male presenting for repeat EGD and esophageal dilation.  Last EGD completed in 05/2019 by Dr. Silverio Decamp as outlined above, demonstrated 8 mm  esophageal diameter which was nontraversable.  he contineus to have dysphagia, albeit much improved since his EGD with dilation in 04/2019.  Continues to tolerate p.o. intake.  Does have to cut meat into small pieces and chew very thoroughly.  Also crushing his metformin.  Has actually gained a couple pounds since his EGD in 04/2019 due to improved p.o. tolerance.  Reflux symptoms.    Review of systems:     No chest pain, no SOB, no fevers, no urinary sx   Past Medical History:  Diagnosis Date  . Acquired esophageal ring   . Aorto-esophageal fistula   . Cancer ([place])    skin  . Cataract    bilateral-removed  . DM type 2 (diabetes mellitus, type 2) ([place])   . GERD (gastroesophageal reflux disease)   . History of esophagitis   . Hyperlipidemia   . Hypertension     Patient's surgical history, family medical history, social history, medications and allergies were all reviewed in Epic    Current Facility-Administered Medications  Medication Dose Route Frequency Provider Last Rate Last Dose  . lactated ringers infusion   Intravenous Continuous Cirigliano, Vito V, DO 20 mL/hr at 08/06/19 0840      Physical Exam:     BP (!) 141/79   Pulse 68   Temp 98.5 F (36.9 C) (Oral)   Resp 16   Ht 5' 10.5" (1.791 m)   Wt 80.3 kg   SpO2 100%   BMI 25.04 kg/m   GENERAL:  Pleasant male in NAD PSYCH: : Cooperative, normal affect EENT:  conjunctiva pink, mucous membranes moist, neck supple without masses CARDIAC:  RRR, no murmur heard, no peripheral edema PULM: Normal respiratory effort, lungs CTA bilaterally, no wheezing ABDOMEN:  Nondistended, soft, nontender. No obvious masses, no hepatomegaly,  normal bowel sounds  SKIN:  turgor, no lesions seen Musculoskeletal:  Normal muscle tone, normal strength NEURO: Alert and oriented x 3, no focal neurologic deficits   IMPRESSION and PLAN:    1) Esophageal stenosis 2) Dysphagia  -Repeat EGD with dilation to be scheduled at [facility] given  possible need for pediatric endoscope and intubation for procedure -Continue PPI and Carafate - Advised patient to continue to cut food into small pieces, eat small bites, chew food thoroughly and with plenty of liquids to avoid food impaction. -Suspect he will need short interval repeat endoscopy for serial dilations - Plan for repeat esophageal biopsies  3) GERD with Erosive Esophagitis -Resume PPI as above -Evaluate for endoscopic improvement at time of repeat endoscopy  4) Weight loss -Stable and has actually increased by a couple of pounds due to improved p.o. intake since EGD with dilation  The indications, risks, and benefits of EGD were explained to the patient in detail. Risks include but are not limited to bleeding, perforation, adverse reaction to medications, and cardiopulmonary compromise. Sequelae include but are not limited to the possibility of surgery, hositalization, and mortality. The patient verbalized understanding and wished to proceed.          [place] ,DO, FACG 08/06/2019, 9:12 AM

## 2019-08-06 NOTE — Op Note (Signed)
[facility] Patient Name: Alfred Clay Procedure Date: 08/06/2019 MRN: [id number] Attending MD: Gerrit Heck , MD Date of Birth: Nov 03, 1950 CSN: [id number] Age: 68 Admit Type: Outpatient Procedure:                Upper GI endoscopy Indications:              Dysphagia, Follow-up of reflux esophagitis                           68 yo male with GERD c/b erosive esophagitis and                            esophageal stenosis requiring serial EGD with                            dilation. Last EGD in 05/2019 notable for LA Grade C                            esophagitis 20?"22 cm from incisors, severe stenosis                            at 22 cm 8 mm diameter, not traversable.                            Recommended repeat at [facility] with pediatric endoscope.                            He continues to have dysphagia. Providers:                Gerrit Heck, MD, Grace Isaac, RN, Lina Sar, Technician, Caryl Pina CRNA Referring MD:              Medicines:                Monitored Anesthesia Care Complications:            No immediate complications. Estimated Blood Loss:     Estimated blood loss was minimal. Procedure:                After obtaining informed consent, the endoscope was                            passed under direct vision. Throughout the                            procedure, the patient's blood pressure, pulse, and                            oxygen saturations were monitored continuously. The                            [id number]-[id number] Olympus ultra slim endoscope  was introduced through the mouth, and advanced to                            the second part of duodenum. The [id number]                            Olympus gastroscope was introduced through the                            mouth, and advanced to the second part of duodenum.                            The upper GI endoscopy was  accomplished without                            difficulty. The patient tolerated the procedure                            well. Scope In: Scope Out: Findings:      The lumen of the entire esophagus was narrowed, with the two most       prominent areas of stenosis located at the GEJ at 40 cm from the       incisors and at 18 cm from the incisors. The narrowest stenosis measured       7 mm (inner diameter). The stenoses were traversed with a pediatric       endoscope. A guidewire was placed and the scope was withdrawn. Dilation       was performed with a Savary dilator with no resistance at 8 mm and 9 mm.       The pediatric endoscope was reinstereted and no mucosal rent noted. A       guidewire was replaced and dilation performed with mild resistance at 11       mm. The endoscope was exchanged for a standard adult endoscope and       inserted. The dilation site was examined following endoscope reinsertion       and showed mild mucosal disruption at 18 cm and 40 cm, consistent with       successful dilation. Biopsies were then obtained from the proximal and       distal esophagus with cold forceps for evaluation of Lymphocytic       Esophagitis and Eosinophilic esophagitis. Estimated blood loss was       minimal.      LA Grade C (one or more mucosal breaks continuous between tops of 2 or       more mucosal folds, less than 75% circumference) esophagitis with no       bleeding was found 20 to 40 cm from the incisors. Biopsies were taken       with a cold forceps for histology. Estimated blood loss was minimal.      The entire examined stomach was normal. There was LES laxity with a Hill       Grade 3 valve and 2-3 cm hiatal hernia noted on retroflexed views.      The duodenal bulb, first portion of the duodenum and second portion of       the duodenum were normal. Impression:               -  Benign-appearing esophageal stenoses. Dilated.                            Biopsied.                            - LA Grade C esophagitis with no bleeding. Biopsied.                           - Normal stomach.                           - Normal duodenal bulb, first portion of the                            duodenum and second portion of the duodenum. Moderate Sedation:      Not Applicable - Patient had care per Anesthesia. Recommendation:           - Patient has a contact number available for                            emergencies. The signs and symptoms of potential                            delayed complications were discussed with the                            patient. Return to normal activities tomorrow.                            Written discharge instructions were provided to the                            patient.                           - Soft diet today, then slowly advance as tolerated                            tomorrow. Drink plenty of fluids with meals.                           - Continue present medications.                           - Await pathology results.                           - Repeat upper endoscopy in 3-4 weeks for                            retreatment. This can be performed in the [place]. Procedure Code(s):        --- Professional ---                           [phone], Esophagogastroduodenoscopy, flexible,  transoral; with insertion of guide wire followed by                            passage of dilator(s) through esophagus over guide                            wire Diagnosis Code(s):        --- Professional ---                           K22.2, Esophageal obstruction                           R13.10, Dysphagia, unspecified                           K21.00, Gastro-esophageal reflux disease with                            esophagitis, without bleeding CPT copyright 2019 American Medical Association. All rights reserved. The codes documented in this report are preliminary and upon coder review may  be revised to meet current  compliance requirements. Gerrit Heck, MD 08/06/2019 10:11:00 AM Number of Addenda: 0

## 2019-08-06 NOTE — Anesthesia Preprocedure Evaluation (Addendum)
Anesthesia Evaluation  Patient identified by MRN, date of birth, ID band Patient awake    Reviewed: Allergy & Precautions, NPO status , Patient's Chart, lab work & pertinent test results  History of Anesthesia Complications Negative for: history of anesthetic complications  Airway Mallampati: I  TM Distance: >3 FB Neck ROM: Full    Dental  (+) Dental Advisory Given, Teeth Intact   Pulmonary neg pulmonary ROS,    Pulmonary exam normal        Cardiovascular hypertension (no meds), Normal cardiovascular exam     Neuro/Psych negative neurological ROS  negative psych ROS   GI/Hepatic Neg liver ROS, GERD  Medicated and Controlled, Esophageal stenosis    Endo/Other  diabetes, Type 2, Oral Hypoglycemic Agents  Renal/GU negative Renal ROS     Musculoskeletal negative musculoskeletal ROS (+)   Abdominal   Peds  Hematology negative hematology ROS (+)   Anesthesia Other Findings   Reproductive/Obstetrics                            Anesthesia Physical Anesthesia Plan  ASA: II  Anesthesia Plan: MAC   Post-op Pain Management:    Induction: Intravenous  PONV Risk Score and Plan: 1 and Propofol infusion and Treatment may vary due to age or medical condition  Airway Management Planned: Nasal Cannula and Natural Airway  Additional Equipment: None  Intra-op Plan:   Post-operative Plan:   Informed Consent: I have reviewed the patients History and Physical, chart, labs and discussed the procedure including the risks, benefits and alternatives for the proposed anesthesia with the patient or authorized representative who has indicated his/her understanding and acceptance.       Plan Discussed with: CRNA and Anesthesiologist  Anesthesia Plan Comments:        Anesthesia Quick Evaluation

## 2019-08-06 NOTE — Anesthesia Postprocedure Evaluation (Signed)
Anesthesia Post Note  Patient: Alfred Clay  Procedure(s) Performed: ESOPHAGOGASTRODUODENOSCOPY (EGD) WITH PROPOFOL (N/A ) BIOPSY SAVORY DILATION (N/A )     Patient location during evaluation: PACU Anesthesia Type: MAC Level of consciousness: awake and alert Pain management: pain level controlled Vital Signs Assessment: post-procedure vital signs reviewed and stable Respiratory status: spontaneous breathing, nonlabored ventilation, respiratory function stable and patient connected to nasal cannula oxygen Cardiovascular status: stable and blood pressure returned to baseline Postop Assessment: no apparent nausea or vomiting Anesthetic complications: no    Last Vitals:  Vitals:   08/06/19 1020 08/06/19 1025  BP: 126/71   Pulse: (!) 54 (!) 52  Resp: 10 14  Temp:    SpO2: 100% 99%    Last Pain:  Vitals:   08/06/19 1020  TempSrc:   PainSc: 0-No pain                 Effie Berkshire

## 2019-08-08 ENCOUNTER — Encounter (HOSPITAL_COMMUNITY): Payer: Self-pay | Admitting: Gastroenterology

## 2019-08-08 ENCOUNTER — Encounter: Payer: Self-pay | Admitting: Gastroenterology

## 2019-08-08 LAB — SURGICAL PATHOLOGY

## 2019-08-26 ENCOUNTER — Encounter: Payer: Self-pay | Admitting: Gastroenterology

## 2019-09-03 ENCOUNTER — Other Ambulatory Visit (HOSPITAL_COMMUNITY)
Admission: RE | Admit: 2019-09-03 | Discharge: 2019-09-03 | Disposition: A | Discharge status: Home / Self Care | Payer: Managed Care, Other (non HMO) | Source: Ambulatory Visit | Attending: Gastroenterology | Admitting: Gastroenterology

## 2019-09-03 DIAGNOSIS — Z20828 Contact with and (suspected) exposure to other viral communicable diseases: Secondary | ICD-10-CM | POA: Insufficient documentation

## 2019-09-03 DIAGNOSIS — Z01812 Encounter for preprocedural laboratory examination: Secondary | ICD-10-CM | POA: Diagnosis not present

## 2019-09-03 LAB — SARS CORONAVIRUS 2 (TAT 6-24 HRS): SARS Coronavirus 2: NEGATIVE

## 2019-09-05 ENCOUNTER — Encounter: Payer: Self-pay | Admitting: Gastroenterology

## 2019-09-05 ENCOUNTER — Ambulatory Visit (AMBULATORY_SURGERY_CENTER): Payer: Managed Care, Other (non HMO) | Admitting: Gastroenterology

## 2019-09-05 ENCOUNTER — Other Ambulatory Visit: Payer: Self-pay

## 2019-09-05 VITALS — BP 112/63 | HR 58 | Temp 98.0°F | Resp 13 | Ht 71.0 in | Wt 177.0 lb

## 2019-09-05 DIAGNOSIS — K222 Esophageal obstruction: Secondary | ICD-10-CM | POA: Diagnosis not present

## 2019-09-05 DIAGNOSIS — K2100 Gastro-esophageal reflux disease with esophagitis, without bleeding: Secondary | ICD-10-CM

## 2019-09-05 DIAGNOSIS — R1310 Dysphagia, unspecified: Secondary | ICD-10-CM

## 2019-09-05 DIAGNOSIS — K449 Diaphragmatic hernia without obstruction or gangrene: Secondary | ICD-10-CM

## 2019-09-05 MED ORDER — SODIUM CHLORIDE 0.9 % IV SOLN: 500.0000 mL | Freq: Once | INTRAVENOUS | Status: DC

## 2019-09-05 NOTE — Patient Instructions (Signed)
Discharge instructions given. Handouts on a full liquid diet,esophagitis and a hiatal hernia. Scheduled repeat EGD and previsit. Office will schedule return office visit. Resume previous medications. YOU HAD AN ENDOSCOPIC PROCEDURE TODAY AT [facility]:   Refer to the procedure report that was given to you for any specific questions about what was found during the examination.  If the procedure report does not answer your questions, please call your gastroenterologist to clarify.  If you requested that your care partner not be given the details of your procedure findings, then the procedure report has been included in a sealed envelope for you to review at your convenience later.  YOU SHOULD EXPECT: Some feelings of bloating in the abdomen. Passage of more gas than usual.  Walking can help get rid of the air that was put into your GI tract during the procedure and reduce the bloating. If you had a lower endoscopy (such as a colonoscopy or flexible sigmoidoscopy) you may notice spotting of blood in your stool or on the toilet paper. If you underwent a bowel prep for your procedure, you may not have a normal bowel movement for a few days.  Please Note:  You might notice some irritation and congestion in your nose or some drainage.  This is from the oxygen used during your procedure.  There is no need for concern and it should clear up in a day or so.  SYMPTOMS TO REPORT IMMEDIATELY:    Following upper endoscopy (EGD)  Vomiting of blood or coffee ground material  New chest pain or pain under the shoulder blades  Painful or persistently difficult swallowing  New shortness of breath  Fever of 100F or higher  Black, tarry-looking stools  For urgent or emergent issues, a gastroenterologist can be reached at any hour by calling [phone].   DIET:  We do recommend a small meal at first, but then you may proceed to your regular diet.  Drink plenty of fluids but you should avoid  alcoholic beverages for 24 hours.  ACTIVITY:  You should plan to take it easy for the rest of today and you should NOT DRIVE or use heavy machinery until tomorrow (because of the sedation medicines used during the test).    FOLLOW UP: Our staff will call the number listed on your records 48-72 hours following your procedure to check on you and address any questions or concerns that you may have regarding the information given to you following your procedure. If we do not reach you, we will leave a message.  We will attempt to reach you two times.  During this call, we will ask if you have developed any symptoms of COVID 19. If you develop any symptoms (ie: fever, flu-like symptoms, shortness of breath, cough etc.) before then, please call [phone].  If you test positive for Covid 19 in the 2 weeks post procedure, please call and report this information to [place].    If any biopsies were taken you will be contacted by phone or by letter within the next 1-3 weeks.  Please call us at [phone] if you have not heard about the biopsies in 3 weeks.    SIGNATURES/CONFIDENTIALITY: You and/or your care partner have signed paperwork which will be entered into your electronic medical record.  These signatures attest to the fact that that the information above on your After Visit Summary has been reviewed and is understood.  Full responsibility of the confidentiality of this discharge information lies  with you and/or your care-partner.

## 2019-09-05 NOTE — Op Note (Signed)
[facility] Patient Name: Alfred Clay Procedure Date: 09/05/2019 9:53 AM MRN: [id number] Endoscopist: Gerrit Heck , MD Age: 68 Referring MD:  Date of Birth: 1951-02-13 Gender: Male Account #: [id number] Procedure:                Upper GI endoscopy Indications:              Dysphagia, Follow-up of reflux esophagitis, For                            therapy of esophageal stenosis                           Endoscopic history:                           -EGD (07/2019, Dr. Bryan Lemma): LA Grade C                            esophagitis, 2-3 cm HH, pan esophageal narrowing                            with severe stenosis at 18 and 40 cm from incisors,                            dilated with 8,9,11 mm Savary dilator with                            appropriate mucosal rent. Biopsies negative for                            EoE, LyE                           ?"EGD (05/2019, Dr. Silverio Decamp): LA Grade C                            esophagitis 20?"22 cm from incisors, severe stenosis                            at 22 cm 8 mm diameter, not traversable.                            Recommended repeat at [facility] with pediatric endoscope                           ?"EGD (04/2019, Dr. Bryan Lemma): 2 stenoses located                            in proximal esophagus (18 cm from incisors) and GE                            J (37 cm from incisors). Dilated with 13 mm TTS  balloon with mucosal rent. Moderately severe                            esophagitis mucosal friability throughout                            esophagus. Biopsies suggestive of lichenoid                            esophagitis. No evidence of EOE or lymphocytic                            esophagitis. 3 cm HH. Normal stomach and                            duodenum. Recommended repeat 4 weeks                           ?"EGD (06/2018, Dr. Alessandra Bevels): LA Grade C                             esophagitis (biopsy: Ulcerated mucosa with reflux                            changes), non-H. pylori gastritis. Normal duodenum                            with normal biopsies.                           ?"06/2017: Started PPI bid                           ?"EGD (09/2018, Dr. Alessandra Bevels): LA Grade C                            esophagitis, a few benign-appearing intrinsic                            moderate stenoses (biopsy: Increased lymphocytes                            concerning for lymphocytic esophagitis) Medicines:                Monitored Anesthesia Care Procedure:                Pre-Anesthesia Assessment:                           - Prior to the procedure, a History and Physical                            was performed, and patient medications and                            allergies were reviewed. The patient's tolerance of  previous anesthesia was also reviewed. The risks                            and benefits of the procedure and the sedation                            options and risks were discussed with the patient.                            All questions were answered, and informed consent                            was obtained. Prior Anticoagulants: The patient has                            taken no previous anticoagulant or antiplatelet                            agents. ASA Grade Assessment: II - A patient with                            mild systemic disease. After reviewing the risks                            and benefits, the patient was deemed in                            satisfactory condition to undergo the procedure.                           After obtaining informed consent, the endoscope was                            passed under direct vision. Throughout the                            procedure, the patient's blood pressure, pulse, and                            oxygen saturations were monitored continuously. The                             Endoscope was introduced through the mouth, and                            advanced to the second part of duodenum. The upper                            GI endoscopy was accomplished without difficulty.                            The patient tolerated the procedure well. Scope In: Scope Out: Findings:  The entire esophageal lumen was narrowed, with                            again two dominant benign-appearing, intrinsic                            moderate stenoses found at 18 cm and 40 cm from the                            incisors. The narrowest stenosis measured 9 mm                            (inner diameter). The stenoses were traversed. A                            guidewire was placed and the scope was withdrawn.                            Dilation was performed with a Savary dilator with                            mild resistance at 10 mm, 11 mm and 13 mm. The                            dilation site was examined following endoscope                            reinsertion and showed mild mucosal disruption at                            both sites, consistent with successful dilation.                            Cold forceps were then used to further fracture the                            more distal stenosis. Estimated blood loss was                            minimal.                           Esophagitis with no bleeding was found 24 to 40 cm                            from the incisors. This appeared overall improved                            from previous.                           Inflammaion was found at the GEJ, located 40 cm  from the incisors. Biopsies were taken with a cold                            forceps for histology. Estimated blood loss was                            minimal.                           The entire examined stomach was normal.                           A 3 cm hiatal hernia was present.                            The duodenal bulb, first portion of the duodenum                            and second portion of the duodenum were normal. Complications:            No immediate complications. Estimated Blood Loss:     Estimated blood loss was minimal. Impression:               - Benign-appearing esophageal stenoses. Dilated.                            Fractured with cold forceps.                           - Esophagitis with no bleeding.                           - Reflux esophagitis with no bleeding. Biopsied.                           - Normal stomach.                           - 3 cm hiatal hernia.                           - Normal duodenal bulb, first portion of the                            duodenum and second portion of the duodenum. Recommendation:           - Patient has a contact number available for                            emergencies. The signs and symptoms of potential                            delayed complications were discussed with the                            patient. Return to normal activities tomorrow.  Written discharge instructions were provided to the                            patient.                           - Full liquid diet today, then advance slowly as                            tolerated.                           - Continue present medications.                           - Await pathology results.                           - Repeat upper endoscopy in 6 weeks for retreatment.                           - Return to GI clinic at appointment in                            approximately 3-4 weeks; appointment to be                            scheduled.                           - Resume high dose acid suppression with                            pantoprazole 40 mg BID as currently doing. Gerrit Heck, MD 09/05/2019 10:31:04 AM

## 2019-09-05 NOTE — Progress Notes (Signed)
Called to room to assist during endoscopic procedure.  Patient ID and intended procedure confirmed with present staff. Received instructions for my participation in the procedure from the performing physician.  

## 2019-09-09 ENCOUNTER — Telehealth: Payer: Self-pay

## 2019-09-09 NOTE — Telephone Encounter (Signed)
Unable to call wrong number.

## 2019-09-12 ENCOUNTER — Encounter: Payer: Self-pay | Admitting: Gastroenterology

## 2019-09-13 DIAGNOSIS — I10 Essential (primary) hypertension: Secondary | ICD-10-CM | POA: Insufficient documentation

## 2019-09-13 DIAGNOSIS — E1165 Type 2 diabetes mellitus with hyperglycemia: Secondary | ICD-10-CM | POA: Insufficient documentation

## 2019-09-13 DIAGNOSIS — Z8719 Personal history of other diseases of the digestive system: Secondary | ICD-10-CM | POA: Insufficient documentation

## 2019-09-13 DIAGNOSIS — E119 Type 2 diabetes mellitus without complications: Secondary | ICD-10-CM | POA: Insufficient documentation

## 2019-09-30 ENCOUNTER — Encounter: Payer: Self-pay | Admitting: Gastroenterology

## 2019-09-30 ENCOUNTER — Other Ambulatory Visit: Payer: Self-pay

## 2019-09-30 ENCOUNTER — Ambulatory Visit (AMBULATORY_SURGERY_CENTER): Payer: Self-pay | Admitting: *Deleted

## 2019-09-30 VITALS — Ht 71.0 in | Wt 170.6 lb

## 2019-09-30 DIAGNOSIS — Z1159 Encounter for screening for other viral diseases: Secondary | ICD-10-CM

## 2019-09-30 DIAGNOSIS — R1310 Dysphagia, unspecified: Secondary | ICD-10-CM

## 2019-09-30 NOTE — Progress Notes (Signed)
Patient denies any allergies to egg or soy products. Patient denies complications with anesthesia/sedation.  Patient denies oxygen use at home and denies diet medications. Emmi instructions for colonoscopy/endoscopy explained and given to patient.  Patient wants covid test in [place], scheduled for 10/02/19 at 1:15 pm.  Card/Map of [facility] given to patient.

## 2019-10-02 ENCOUNTER — Ambulatory Visit (INDEPENDENT_AMBULATORY_CARE_PROVIDER_SITE_OTHER): Payer: Managed Care, Other (non HMO)

## 2019-10-02 DIAGNOSIS — Z1159 Encounter for screening for other viral diseases: Secondary | ICD-10-CM

## 2019-10-03 LAB — SARS CORONAVIRUS 2 (TAT 6-24 HRS): SARS Coronavirus 2: NEGATIVE

## 2019-10-07 ENCOUNTER — Ambulatory Visit (AMBULATORY_SURGERY_CENTER): Payer: Managed Care, Other (non HMO) | Admitting: Gastroenterology

## 2019-10-07 ENCOUNTER — Other Ambulatory Visit: Payer: Self-pay

## 2019-10-07 ENCOUNTER — Encounter: Payer: Self-pay | Admitting: Gastroenterology

## 2019-10-07 VITALS — BP 135/75 | HR 51 | Temp 98.4°F | Resp 10 | Ht 71.0 in | Wt 170.0 lb

## 2019-10-07 DIAGNOSIS — R1310 Dysphagia, unspecified: Secondary | ICD-10-CM | POA: Diagnosis present

## 2019-10-07 DIAGNOSIS — K222 Esophageal obstruction: Secondary | ICD-10-CM | POA: Diagnosis not present

## 2019-10-07 DIAGNOSIS — K449 Diaphragmatic hernia without obstruction or gangrene: Secondary | ICD-10-CM | POA: Diagnosis not present

## 2019-10-07 DIAGNOSIS — R1319 Other dysphagia: Secondary | ICD-10-CM

## 2019-10-07 MED ORDER — SODIUM CHLORIDE 0.9 % IV SOLN: 500.0000 mL | Freq: Once | INTRAVENOUS | Status: DC

## 2019-10-07 MED ORDER — SUCRALFATE 1 GM/10ML PO SUSP: 1.0000 g | Freq: Two times a day (BID) | ORAL | 1 refills | Status: DC

## 2019-10-07 NOTE — Progress Notes (Signed)
Pt's states no medical or surgical changes since previsit or office visit. 

## 2019-10-07 NOTE — Op Note (Signed)
[place] Patient Name: Alfred Clay Procedure Date: 10/07/2019 12:03 PM MRN: [id number] Endoscopist: Gerrit Heck , MD Age: 69 Referring MD:  Date of Birth: April 26, 1951 Gender: Male Account #: [id number] Procedure:                Upper GI endoscopy Indications:              Dysphagia, , For therapy of esophageal stenosis. He                            has been clinically responsive to PPI therapy and                            serial dilations, currently tolerating all PO                            intake.                           Endoscopic history:                           -EGD (08/2019, Dr. Bryan Lemma): Narrowed esophagus                            with dominant stricture at 43 and 40 cm, dilated                            with 13 mm Savary. Esophagitis 24-40 cm, 2 cm HH.                            +Bxs negative for LyE, EoE                           -EGD (07/2019, Dr. Bryan Lemma): LA Grade C                            esophagitis, 2-3 cm HH, pan esophageal narrowing                            with severe stenosis at 18 and 40 cm from incisors,                            dilated with 8,9,11 mm Savary dilator with                           appropriate mucosal rent. Biopsies negative for                            EoE, LyE                           -EGD (05/2019, Dr. Silverio Decamp): LA Grade C esophagitis                            20-22 cm from incisors, severe  stenosis at 22 cm 8                            mm diameter, not traversable. Recommended repeat at                            [facility] with pediatric                           endoscope                           -EGD (04/2019, Dr. Bryan Lemma): 2 stenoses located                            in proximal esophagus (18 cm from incisors) and GE                            J (37 cm from incisors). Dilated with 13 mm TTS                            balloon with mucosal rent.                           Moderately severe  esophagitis mucosal friability                            throughout esophagus. Biopsies suggestive                           of lichenoid esophagitis. No evidence of EOE or                            lymphocytic esophagitis. 3 cm HH. Normal stomach                            and duodenum. Recommended repeat 4 weeks                           -EGD (06/2018, Dr. Alessandra Bevels): LA Grade C                            esophagitis (biopsy: Ulcerated mucosa with reflux                            changes), non-H. pylori gastritis. Normal duodenum                            with normal biopsies.                           -06/2017: Started PPI bid                           -EGD (09/2018, Dr. Alessandra Bevels): LA Grade C  esophagitis, a few benign-appearing intrinsic                            moderate stenoses (biopsy: Increased lymphocytes                            concerning for lymphocytic esophagitis) Medicines:                Monitored Anesthesia Care Procedure:                Pre-Anesthesia Assessment:                           - Prior to the procedure, a History and Physical                            was performed, and patient medications and                            allergies were reviewed. The patient's tolerance of                            previous anesthesia was also reviewed. The risks                            and benefits of the procedure and the sedation                            options and risks were discussed with the patient.                            All questions were answered, and informed consent                            was obtained. Prior Anticoagulants: The patient has                            taken no previous anticoagulant or antiplatelet                            agents. ASA Grade Assessment: II - A patient with                            mild systemic disease. After reviewing the risks                            and benefits, the patient  was deemed in                            satisfactory condition to undergo the procedure.                           After obtaining informed consent, the endoscope was  passed under direct vision. Throughout the                            procedure, the patient's blood pressure, pulse, and                            oxygen saturations were monitored continuously. The                            Endoscope was introduced through the mouth, and                            advanced to the second part of duodenum. The upper                            GI endoscopy was accomplished without difficulty.                            The patient tolerated the procedure well. Scope In: Scope Out: Findings:                 The entire esophagus was once again narrowed, with                            inflammatory changes (esophagitis) throughout. The                            esophagitis appeared improved from prior studies.                            Two dominant benign-appearing, intrinsic moderate                            stenoses were found 18 cm and 40 cm from the                            incisors. The narrowest stenosis measured 9 mm                            (inner diameter) at 18 cm location. The stenoses                            were traversed. A guidewire was placed and the                            scope was withdrawn. Dilation was performed with a                            Savary dilator with mild resistance at 12 mm and 14                            mm. The dilation site was examined following  endoscope reinsertion and showed mild mucosal                            disruption at each site consistent with successful                            dilation. Estimated blood loss was minimal.                           A 2 cm hiatal hernia was present.                           The entire examined stomach was normal.                            The duodenal bulb, first portion of the duodenum                            and second portion of the duodenum were normal. Complications:            No immediate complications. Estimated Blood Loss:     Estimated blood loss was minimal. Impression:               - Benign-appearing esophageal stenoses x2.                            Successfully dilated with 14 m Savary dilator.                           - 2 cm hiatal hernia.                           - Normal stomach.                           - Normal duodenal bulb, first portion of the                            duodenum and second portion of the duodenum.                           - No specimens collected. Recommendation:           - Patient has a contact number available for                            emergencies. The signs and symptoms of potential                            delayed complications were discussed with the                            patient. Return to normal activities tomorrow.                            Written discharge instructions were provided to the  patient.                           - Soft diet today and advance as tolerated tomorrow.                           - Continue present medications.                           - Use sucralfate tablets 1 gram PO BID for 4 weeks.                           - Repeat upper endoscopy in 6 weeks for                            retreatment. Repeat biopsies at time of next                            endoscopy if persistent inflammatory changes to                            again rule out EoE, LyE. Gerrit Heck, MD 10/07/2019 12:30:54 PM

## 2019-10-07 NOTE — Progress Notes (Signed)
Called to room to assist during endoscopic procedure.  Patient ID and intended procedure confirmed with present staff. Received instructions for my participation in the procedure from the performing physician.  

## 2019-10-07 NOTE — Patient Instructions (Signed)
YOU HAD AN ENDOSCOPIC PROCEDURE TODAY AT [facility]:   Refer to the procedure report that was given to you for any specific questions about what was found during the examination.  If the procedure report does not answer your questions, please call your gastroenterologist to clarify.  If you requested that your care partner not be given the details of your procedure findings, then the procedure report has been included in a sealed envelope for you to review at your convenience later.  YOU SHOULD EXPECT: Some feelings of bloating in the abdomen. Passage of more gas than usual.  Walking can help get rid of the air that was put into your GI tract during the procedure and reduce the bloating. If you had a lower endoscopy (such as a colonoscopy or flexible sigmoidoscopy) you may notice spotting of blood in your stool or on the toilet paper. If you underwent a bowel prep for your procedure, you may not have a normal bowel movement for a few days.  Please Note:  You might notice some irritation and congestion in your nose or some drainage.  This is from the oxygen used during your procedure.  There is no need for concern and it should clear up in a day or so.  Symptoms to report immediately:    Following upper endoscopy (EGD)  Vomiting of blood or coffee ground material  New chest pain or pain under the shoulder blades  Painful or persistently difficult swallowing  New shortness of breath  Fever of 100F or higher  Black, tarry-looking stools  For urgent or emergent issues, a gastroenterologist can be reached at any hour by calling [phone].   DIET:  We do recommend a small meal at first, but then you may proceed to your regular diet.  Drink plenty of fluids but you should avoid alcoholic beverages for 24 hours.  ACTIVITY:  You should plan to take it easy for the rest of today and you should NOT DRIVE or use heavy machinery until tomorrow (because of the sedation medicines used  during the test).    FOLLOW UP: Our staff will call the number listed on your records 48-72 hours following your procedure to check on you and address any questions or concerns that you may have regarding the information given to you following your procedure. If we do not reach you, we will leave a message.  We will attempt to reach you two times.  During this call, we will ask if you have developed any symptoms of COVID 19. If you develop any symptoms (ie: fever, flu-like symptoms, shortness of breath, cough etc.) before then, please call [phone].  If you test positive for Covid 19 in the 2 weeks post procedure, please call and report this information to [place].    If any biopsies were taken you will be contacted by phone or by letter within the next 1-3 weeks.  Please call us at [phone] if you have not heard about the biopsies in 3 weeks.    SIGNATURES/CONFIDENTIALITY: You and/or your care partner have signed paperwork which will be entered into your electronic medical record.  These signatures attest to the fact that that the information above on your After Visit Summary has been reviewed and is understood.  Full responsibility of the confidentiality of this discharge information lies with you and/or your care-partner.

## 2019-10-07 NOTE — Progress Notes (Signed)
A/ox3, pleased with MAC, report to RN 

## 2019-10-09 ENCOUNTER — Telehealth: Payer: Self-pay

## 2019-10-09 NOTE — Telephone Encounter (Signed)
1. Have you developed a fever since your procedure? No    2.   Have you had an respiratory symptoms (SOB or cough) since your procedure? No  3.   Have you tested positive for COVID 19 since your procedure No  4.   Have you had any family members/close contacts diagnosed with the COVID 19 since your procedure?  No   If yes to any of these questions please route to Joylene John, RN and Alphonsa Gin, RN.    Follow up Call-  Call back number 10/07/2019 09/05/2019 06/13/2019 05/20/2019  Post procedure Call Back phone  # [phone]  Permission to leave phone message Yes Yes Yes Yes     Patient questions:  Do you have a fever, pain , or abdominal swelling? No. Pain Score  0 *  Have you tolerated food without any problems? Yes.    Have you been able to return to your normal activities? Yes.    Do you have any questions about your discharge instructions: Diet   No. Medications  No. Follow up visit  No.  Do you have questions or concerns about your Care? No.  Actions: * If pain score is 4 or above: No action needed, pain <4.

## 2019-10-16 ENCOUNTER — Other Ambulatory Visit: Payer: Self-pay

## 2019-10-16 ENCOUNTER — Ambulatory Visit (INDEPENDENT_AMBULATORY_CARE_PROVIDER_SITE_OTHER): Payer: Managed Care, Other (non HMO) | Admitting: Family Medicine

## 2019-10-16 ENCOUNTER — Encounter: Payer: Self-pay | Admitting: Family Medicine

## 2019-10-16 VITALS — BP 118/78 | HR 71 | Temp 96.4°F | Ht 71.0 in | Wt 174.0 lb

## 2019-10-16 DIAGNOSIS — E1165 Type 2 diabetes mellitus with hyperglycemia: Secondary | ICD-10-CM

## 2019-10-16 DIAGNOSIS — Z1159 Encounter for screening for other viral diseases: Secondary | ICD-10-CM | POA: Diagnosis not present

## 2019-10-16 DIAGNOSIS — B351 Tinea unguium: Secondary | ICD-10-CM | POA: Diagnosis not present

## 2019-10-16 DIAGNOSIS — E1140 Type 2 diabetes mellitus with diabetic neuropathy, unspecified: Secondary | ICD-10-CM

## 2019-10-16 DIAGNOSIS — Z23 Encounter for immunization: Secondary | ICD-10-CM | POA: Diagnosis not present

## 2019-10-16 LAB — MICROALBUMIN / CREATININE URINE RATIO
Creatinine,U: 75 mg/dL
Microalb Creat Ratio: 0.9 mg/g (ref 0.0–30.0)
Microalb, Ur: <0.7 mg/dL (ref 0.0–1.9)

## 2019-10-16 LAB — CBC
HCT: 37.2 % — ABNORMAL LOW (ref 39.0–52.0)
Hemoglobin: 12.6 g/dL — ABNORMAL LOW (ref 13.0–17.0)
MCHC: 33.9 g/dL (ref 30.0–36.0)
MCV: 90.6 fl (ref 78.0–100.0)
Platelets: 270 10*3/uL (ref 150.0–400.0)
RBC: 4.11 Mil/uL — ABNORMAL LOW (ref 4.22–5.81)
RDW: 13.5 % (ref 11.5–15.5)
WBC: 5.9 10*3/uL (ref 4.0–10.5)

## 2019-10-16 LAB — COMPREHENSIVE METABOLIC PANEL
ALT: 9 U/L (ref 0–53)
AST: 13 U/L (ref 0–37)
Albumin: 4.1 g/dL (ref 3.5–5.2)
Alkaline Phosphatase: 61 U/L (ref 39–117)
BUN: 24 mg/dL — ABNORMAL HIGH (ref 6–23)
CO2: 28 mEq/L (ref 19–32)
Calcium: 9.4 mg/dL (ref 8.4–10.5)
Chloride: 102 mEq/L (ref 96–112)
Creatinine, Ser: 0.92 mg/dL (ref 0.40–1.50)
GFR: 81.67 mL/min (ref >60.00)
Glucose, Bld: 159 mg/dL — ABNORMAL HIGH (ref 70–99)
Potassium: 4.5 mEq/L (ref 3.5–5.1)
Sodium: 136 mEq/L (ref 135–145)
Total Bilirubin: 0.6 mg/dL (ref 0.2–1.2)
Total Protein: 6.8 g/dL (ref 6.0–8.3)

## 2019-10-16 LAB — LIPID PANEL
Cholesterol: 212 mg/dL — ABNORMAL HIGH (ref 0–200)
HDL: 57.2 mg/dL (ref >39.00)
LDL Cholesterol: 143 mg/dL — ABNORMAL HIGH (ref 0–99)
NonHDL: 155.17
Total CHOL/HDL Ratio: 4
Triglycerides: 59 mg/dL (ref 0.0–149.0)
VLDL: 11.8 mg/dL (ref 0.0–40.0)

## 2019-10-16 LAB — HEMOGLOBIN A1C: Hgb A1c MFr Bld: 6.5 % (ref 4.6–6.5)

## 2019-10-16 MED ORDER — ATORVASTATIN CALCIUM 10 MG PO TABS: 10.0000 mg | ORAL_TABLET | Freq: Every day | ORAL | 3 refills | Status: DC

## 2019-10-16 NOTE — Progress Notes (Signed)
Chief Complaint  Patient presents with  . New Patient (Initial Visit)       New Patient Visit SUBJECTIVE: HPI: Alfred Clay is an 69 y.o.male who is being seen for establishing care.  DM  Hx of DM, takes metformin 500 mg bid. Eats healthy. Stays active at work. No AE's w meds. Sugars running in mid 100's. No hypoglycemia. Eye exam several mo ago. Thinks he got pneumonia vaccine 3 yrs ago, unsure which one. He has not had a flu shot this season. He has a hx of HTN, but after losing 140 lbs, is no longer on medication. He was on Crestor in the past, but it caused him to lose his taste. He does not think he has been on another cholesterol lowering medication. He denies chest pain or sob. He is not on insulin. Wt is stable overall.   Past Medical History:  Diagnosis Date  . Acquired esophageal ring   . Aorto-esophageal fistula   . Cancer ([place])    skin- upper back and lower back  . Cataract    bilateral-removed  . DM type 2 (diabetes mellitus, type 2) ([place])   . GERD (gastroesophageal reflux disease)   . History of esophagitis   . Hyperlipidemia    no meds  . Hypertension    no meds   Past Surgical History:  Procedure Laterality Date  . BIOPSY  08/06/2019   Procedure: BIOPSY;  Surgeon: Lavena Bullion, DO;  Location: [facility];  Service: Gastroenterology;;  . CATARACT EXTRACTION Bilateral 10/2018  . COLONOSCOPY  2018  . ESOPHAGOGASTRODUODENOSCOPY  10/16/2018  . ESOPHAGOGASTRODUODENOSCOPY (EGD) WITH PROPOFOL N/A 08/06/2019   Procedure: ESOPHAGOGASTRODUODENOSCOPY (EGD) WITH PROPOFOL;  Surgeon: Lavena Bullion, DO;  Location: [facility];  Service: Gastroenterology;  Laterality: N/A;  with dilation using ped scope  . HEMORRHOID SURGERY    . SAVORY DILATION N/A 08/06/2019   Procedure: SAVORY DILATION;  Surgeon: Lavena Bullion, DO;  Location: [facility];  Service: Gastroenterology;  Laterality: N/A;  . WISDOM TOOTH EXTRACTION     Family History  Problem Relation  Age of Onset  . Diabetes Mother   . Colon cancer Mother   . Colon polyps Mother   . Esophageal cancer Neg Hx   . Rectal cancer Neg Hx   . Stomach cancer Neg Hx    Allergies  Allergen Reactions  . Ace Inhibitors Cough    Current Outpatient Medications:  .  Apoaequorin (PREVAGEN EXTRA STRENGTH) 20 MG CAPS, Take 20 mg by mouth daily., Disp: , Rfl:  .  metFORMIN (GLUCOPHAGE) 500 MG tablet, Take 500 mg by mouth 2 (two) times daily with a meal., Disp: , Rfl:  .  sucralfate (CARAFATE) 1 GM/10ML suspension, Take 10 mLs (1 g total) by mouth 2 (two) times daily., Disp: 420 mL, Rfl: 1 .  atorvastatin (LIPITOR) 10 MG tablet, Take 1 tablet (10 mg total) by mouth daily., Disp: 30 tablet, Rfl: 3 .  pantoprazole (PROTONIX) 40 MG tablet, Take 1 tablet (40 mg total) by mouth 2 (two) times daily., Disp: 180 tablet, Rfl: 5  ROS Cardiovascular: Denies chest pain  Respiratory: Denies dyspnea   OBJECTIVE: BP 118/78 (BP Location: Left Arm, Patient Position: Sitting, Cuff Size: Normal)   Pulse 71   Temp (!) 96.4 F (35.8 C) (Temporal)   Ht 5\' 11"  (1.803 m)   Wt 174 lb (78.9 kg)   SpO2 95%   BMI 24.27 kg/m  General:  well developed, well nourished, in no apparent  distress Skin: hypertrophic and deformed nails b/l feet; otherwise no significant moles, warts, or growths Lungs:  clear to auscultation, breath sounds equal bilaterally, no respiratory distress Cardio:  regular rate and rhythm, no LE edema or bruits Musculoskeletal:  symmetrical muscle groups noted without atrophy or deformity Neuro:  gait normal, sensation largely intact to pinprick on b/l feet, some blunted sensation lateral L foot Psych: well oriented with normal range of affect and appropriate judgment/insight  ASSESSMENT/PLAN: Type 2 diabetes mellitus with hyperglycemia, without long-term current use of insulin (HCC) - Plan: CBC, Comprehensive metabolic panel, Microalbumin / creatinine urine ratio, Hemoglobin A1c, Lipid panel, HM  DIABETES FOOT EXAM  Need for influenza vaccination - Plan: Flu Vaccine QUAD High Dose(Fluad)  Encounter for hepatitis C screening test for low risk patient - Plan: Hepatitis C antibody  Type 2 diabetes mellitus with diabetic neuropathy, without long-term current use of insulin (HCC)  Onychomycosis  DM- Cont metformin, ck above. May need to increase. Counseled on diet and exercise. Start statin. Will do low dose given issues with Crestor.  Onychomycosis- discussed options, will go with topical Vick's.  Need to see some records for imms.  Patient should return in 6 mo for CPE or prn. The patient voiced understanding and agreement to the plan.   [place], DO 10/16/19  8:29 AM

## 2019-10-16 NOTE — Patient Instructions (Signed)
Give [place] 2-3 business days to get the results of your labs back.   Keep the diet clean and stay active.  Keep checking your sugars.  Let me know if there are issues with the Lipitor.   Let us know if you need anything.

## 2019-10-17 LAB — HEPATITIS C ANTIBODY
Hepatitis C Ab: NONREACTIVE
SIGNAL TO CUT-OFF: 0.02 (ref <1.00)

## 2019-10-18 ENCOUNTER — Other Ambulatory Visit: Payer: Self-pay | Admitting: Family Medicine

## 2019-10-18 DIAGNOSIS — E785 Hyperlipidemia, unspecified: Secondary | ICD-10-CM

## 2019-10-28 ENCOUNTER — Other Ambulatory Visit: Payer: Self-pay

## 2019-10-28 ENCOUNTER — Ambulatory Visit (AMBULATORY_SURGERY_CENTER): Payer: Managed Care, Other (non HMO) | Admitting: *Deleted

## 2019-10-28 VITALS — Ht 71.0 in | Wt 177.0 lb

## 2019-10-28 DIAGNOSIS — K222 Esophageal obstruction: Secondary | ICD-10-CM

## 2019-10-28 DIAGNOSIS — Z01818 Encounter for other preprocedural examination: Secondary | ICD-10-CM

## 2019-10-28 NOTE — Progress Notes (Signed)
Pt's previsit is done over the phone and all paperwork (prep instructions, blank consent form to just read over, pre-procedure acknowledgement form and stamped envelope) sent to patient  No egg or soy allergy  No home oxygen use or problems with anesthesia  No medications for weight loss taken  emmi information given  Pt is aware that care partner will wait in the car during procedure; if they feel like they will be too hot or cold to wait in the car; they may wait in the 4 th floor lobby. Patient is aware to bring only one care partner. We want them to wear a mask (we do not have any that we can provide them), practice social distancing, and we will check their temperatures when they get here.  I did remind the patient that their care partner needs to stay in the parking lot the entire time and have a cell phone available, we will call them when the pt is ready for discharge. Patient will wear mask into building.  covid test 11-07-19 at 800

## 2019-10-30 ENCOUNTER — Telehealth: Payer: Self-pay

## 2019-10-30 NOTE — Telephone Encounter (Signed)
PA approved on Protonix 40mg  to take 2 times a day through 10/27/2020. Reference number [id number] throught [place]

## 2019-11-05 ENCOUNTER — Encounter (HOSPITAL_COMMUNITY): Payer: Self-pay | Admitting: Emergency Medicine

## 2019-11-05 ENCOUNTER — Emergency Department (HOSPITAL_COMMUNITY): Payer: Managed Care, Other (non HMO)

## 2019-11-05 ENCOUNTER — Inpatient Hospital Stay (HOSPITAL_COMMUNITY)
Admission: EM | Disposition: A | Discharge status: Home / Self Care | Payer: Self-pay | Source: Home / Self Care | Attending: Cardiology

## 2019-11-05 ENCOUNTER — Inpatient Hospital Stay (HOSPITAL_COMMUNITY)
Admission: EM | Admit: 2019-11-05 | Discharge: 2019-11-08 | DRG: 247 | Disposition: A | Discharge status: Home / Self Care | Payer: Managed Care, Other (non HMO) | Attending: Cardiology | Admitting: Cardiology

## 2019-11-05 ENCOUNTER — Other Ambulatory Visit: Payer: Self-pay

## 2019-11-05 DIAGNOSIS — Z833 Family history of diabetes mellitus: Secondary | ICD-10-CM | POA: Diagnosis not present

## 2019-11-05 DIAGNOSIS — I10 Essential (primary) hypertension: Secondary | ICD-10-CM | POA: Diagnosis present

## 2019-11-05 DIAGNOSIS — I214 Non-ST elevation (NSTEMI) myocardial infarction: Secondary | ICD-10-CM | POA: Diagnosis present

## 2019-11-05 DIAGNOSIS — Z79899 Other long term (current) drug therapy: Secondary | ICD-10-CM

## 2019-11-05 DIAGNOSIS — Z888 Allergy status to other drugs, medicaments and biological substances status: Secondary | ICD-10-CM | POA: Diagnosis not present

## 2019-11-05 DIAGNOSIS — I255 Ischemic cardiomyopathy: Secondary | ICD-10-CM | POA: Diagnosis present

## 2019-11-05 DIAGNOSIS — Z85828 Personal history of other malignant neoplasm of skin: Secondary | ICD-10-CM

## 2019-11-05 DIAGNOSIS — Z800 Family history of malignant neoplasm of digestive organs: Secondary | ICD-10-CM

## 2019-11-05 DIAGNOSIS — Z955 Presence of coronary angioplasty implant and graft: Secondary | ICD-10-CM

## 2019-11-05 DIAGNOSIS — K219 Gastro-esophageal reflux disease without esophagitis: Secondary | ICD-10-CM | POA: Diagnosis present

## 2019-11-05 DIAGNOSIS — E785 Hyperlipidemia, unspecified: Secondary | ICD-10-CM | POA: Diagnosis present

## 2019-11-05 DIAGNOSIS — Z7984 Long term (current) use of oral hypoglycemic drugs: Secondary | ICD-10-CM | POA: Diagnosis not present

## 2019-11-05 DIAGNOSIS — Z20822 Contact with and (suspected) exposure to covid-19: Secondary | ICD-10-CM | POA: Diagnosis present

## 2019-11-05 DIAGNOSIS — Z8371 Family history of colonic polyps: Secondary | ICD-10-CM

## 2019-11-05 DIAGNOSIS — E1169 Type 2 diabetes mellitus with other specified complication: Secondary | ICD-10-CM | POA: Diagnosis present

## 2019-11-05 DIAGNOSIS — I493 Ventricular premature depolarization: Secondary | ICD-10-CM | POA: Diagnosis not present

## 2019-11-05 DIAGNOSIS — I2109 ST elevation (STEMI) myocardial infarction involving other coronary artery of anterior wall: Secondary | ICD-10-CM | POA: Diagnosis not present

## 2019-11-05 DIAGNOSIS — I2510 Atherosclerotic heart disease of native coronary artery without angina pectoris: Secondary | ICD-10-CM | POA: Diagnosis present

## 2019-11-05 HISTORY — PX: LEFT HEART CATH AND CORONARY ANGIOGRAPHY: CATH118249

## 2019-11-05 HISTORY — PX: CORONARY/GRAFT ACUTE MI REVASCULARIZATION: CATH118305

## 2019-11-05 HISTORY — PX: CORONARY STENT INTERVENTION: CATH118234

## 2019-11-05 LAB — BASIC METABOLIC PANEL
Anion gap: 11 (ref 5–15)
BUN: 19 mg/dL (ref 8–23)
CO2: 24 mmol/L (ref 22–32)
Calcium: 9.5 mg/dL (ref 8.9–10.3)
Chloride: 103 mmol/L (ref 98–111)
Creatinine, Ser: 1.1 mg/dL (ref 0.61–1.24)
GFR calc Af Amer: >60 mL/min (ref >60)
GFR calc non Af Amer: >60 mL/min (ref >60)
Glucose, Bld: 154 mg/dL — ABNORMAL HIGH (ref 70–99)
Potassium: 4.4 mmol/L (ref 3.5–5.1)
Sodium: 138 mmol/L (ref 135–145)

## 2019-11-05 LAB — RESPIRATORY PANEL BY RT PCR (FLU A&B, COVID)
Influenza A by PCR: NEGATIVE
Influenza B by PCR: NEGATIVE
SARS Coronavirus 2 by RT PCR: NEGATIVE

## 2019-11-05 LAB — CBC WITH DIFFERENTIAL/PLATELET
Abs Immature Granulocytes: 0.04 10*3/uL (ref 0.00–0.07)
Basophils Absolute: 0 10*3/uL (ref 0.0–0.1)
Basophils Relative: 0 %
Eosinophils Absolute: 0 10*3/uL (ref 0.0–0.5)
Eosinophils Relative: 0 %
HCT: 40.4 % (ref 39.0–52.0)
Hemoglobin: 13.5 g/dL (ref 13.0–17.0)
Immature Granulocytes: 0 %
Lymphocytes Relative: 8 %
Lymphs Abs: 0.8 10*3/uL (ref 0.7–4.0)
MCH: 30.6 pg (ref 26.0–34.0)
MCHC: 33.4 g/dL (ref 30.0–36.0)
MCV: 91.6 fL (ref 80.0–100.0)
Monocytes Absolute: 0.4 10*3/uL (ref 0.1–1.0)
Monocytes Relative: 4 %
Neutro Abs: 9.1 10*3/uL — ABNORMAL HIGH (ref 1.7–7.7)
Neutrophils Relative %: 88 %
Platelets: 237 10*3/uL (ref 150–400)
RBC: 4.41 MIL/uL (ref 4.22–5.81)
RDW: 12.5 % (ref 11.5–15.5)
WBC: 10.5 10*3/uL (ref 4.0–10.5)
nRBC: 0 % (ref 0.0–0.2)

## 2019-11-05 LAB — SARS CORONAVIRUS 2 (TAT 6-24 HRS): SARS Coronavirus 2: NEGATIVE

## 2019-11-05 LAB — TROPONIN I (HIGH SENSITIVITY)
Troponin I (High Sensitivity): 2289 ng/L (ref <18)
Troponin I (High Sensitivity): 15868 ng/L (ref <18)

## 2019-11-05 LAB — POCT ACTIVATED CLOTTING TIME: Activated Clotting Time: 389 seconds

## 2019-11-05 LAB — MRSA PCR SCREENING: MRSA by PCR: NEGATIVE

## 2019-11-05 SURGERY — LEFT HEART CATH AND CORONARY ANGIOGRAPHY
Anesthesia: LOCAL

## 2019-11-05 MED ORDER — TIROFIBAN HCL IN NACL 5-0.9 MG/100ML-% IV SOLN: 0.1500 ug/kg/min | INTRAVENOUS | Status: AC

## 2019-11-05 MED ORDER — HEPARIN SODIUM (PORCINE) 1000 UNIT/ML IJ SOLN
INTRAMUSCULAR | Status: DC | PRN
  Administered 2019-11-05: 4000 [IU] via INTRAVENOUS
  Administered 2019-11-05: 6000 [IU] via INTRAVENOUS

## 2019-11-05 MED ORDER — TICAGRELOR 90 MG PO TABS
ORAL_TABLET | ORAL | Status: AC
  Filled 2019-11-05: qty 2

## 2019-11-05 MED ORDER — SODIUM CHLORIDE 0.9 % IV SOLN: 250.0000 mL | INTRAVENOUS | Status: DC | PRN

## 2019-11-05 MED ORDER — HEPARIN (PORCINE) IN NACL 1000-0.9 UT/500ML-% IV SOLN
INTRAVENOUS | Status: DC | PRN
  Administered 2019-11-05 (×2): 500 mL

## 2019-11-05 MED ORDER — FENTANYL CITRATE (PF) 100 MCG/2ML IJ SOLN
INTRAMUSCULAR | Status: DC | PRN
  Administered 2019-11-05: 25 ug via INTRAVENOUS

## 2019-11-05 MED ORDER — HEPARIN (PORCINE) IN NACL 1000-0.9 UT/500ML-% IV SOLN
INTRAVENOUS | Status: AC
  Filled 2019-11-05: qty 500

## 2019-11-05 MED ORDER — ASPIRIN 81 MG PO CHEW
CHEWABLE_TABLET | ORAL | Status: AC
  Filled 2019-11-05: qty 4

## 2019-11-05 MED ORDER — FENTANYL CITRATE (PF) 100 MCG/2ML IJ SOLN
INTRAMUSCULAR | Status: AC
  Filled 2019-11-05: qty 2

## 2019-11-05 MED ORDER — HEPARIN SODIUM (PORCINE) 1000 UNIT/ML IJ SOLN
INTRAMUSCULAR | Status: AC
  Filled 2019-11-05: qty 1

## 2019-11-05 MED ORDER — TICAGRELOR 90 MG PO TABS
90.0000 mg | ORAL_TABLET | Freq: Two times a day (BID) | ORAL | Status: DC
  Administered 2019-11-05 – 2019-11-08 (×6): 90 mg via ORAL
  Filled 2019-11-05 (×6): qty 1

## 2019-11-05 MED ORDER — MIDAZOLAM HCL 2 MG/2ML IJ SOLN
INTRAMUSCULAR | Status: AC
  Filled 2019-11-05: qty 2

## 2019-11-05 MED ORDER — SODIUM CHLORIDE 0.9% FLUSH
3.0000 mL | Freq: Two times a day (BID) | INTRAVENOUS | Status: DC
  Administered 2019-11-06 – 2019-11-08 (×3): 3 mL via INTRAVENOUS

## 2019-11-05 MED ORDER — ATORVASTATIN CALCIUM 80 MG PO TABS
80.0000 mg | ORAL_TABLET | Freq: Every day | ORAL | Status: DC
  Administered 2019-11-06 – 2019-11-08 (×3): 80 mg via ORAL
  Filled 2019-11-05 (×3): qty 1

## 2019-11-05 MED ORDER — TIROFIBAN HCL IN NACL 5-0.9 MG/100ML-% IV SOLN
INTRAVENOUS | Status: AC | PRN
  Administered 2019-11-05: 0.15 ug/kg/min via INTRAVENOUS

## 2019-11-05 MED ORDER — PANTOPRAZOLE SODIUM 40 MG PO TBEC
40.0000 mg | DELAYED_RELEASE_TABLET | Freq: Two times a day (BID) | ORAL | Status: DC
  Administered 2019-11-05 – 2019-11-08 (×6): 40 mg via ORAL
  Filled 2019-11-05 (×6): qty 1

## 2019-11-05 MED ORDER — SODIUM CHLORIDE 0.9 % IV SOLN: INTRAVENOUS | Status: AC

## 2019-11-05 MED ORDER — MIDAZOLAM HCL 2 MG/2ML IJ SOLN
INTRAMUSCULAR | Status: DC | PRN
  Administered 2019-11-05: 1 mg via INTRAVENOUS

## 2019-11-05 MED ORDER — ASPIRIN 81 MG PO CHEW
81.0000 mg | CHEWABLE_TABLET | Freq: Every day | ORAL | Status: DC
  Administered 2019-11-07 – 2019-11-08 (×2): 81 mg via ORAL
  Filled 2019-11-05 (×3): qty 1

## 2019-11-05 MED ORDER — TICAGRELOR 90 MG PO TABS
ORAL_TABLET | ORAL | Status: DC | PRN
  Administered 2019-11-05: 180 mg via ORAL

## 2019-11-05 MED ORDER — IOHEXOL 350 MG/ML SOLN
INTRAVENOUS | Status: AC
  Filled 2019-11-05: qty 1

## 2019-11-05 MED ORDER — SODIUM CHLORIDE 0.9% FLUSH: 3.0000 mL | INTRAVENOUS | Status: DC | PRN

## 2019-11-05 MED ORDER — METOPROLOL TARTRATE 12.5 MG HALF TABLET
12.5000 mg | ORAL_TABLET | Freq: Two times a day (BID) | ORAL | Status: DC
  Administered 2019-11-05: 17:00:00 12.5 mg via ORAL
  Filled 2019-11-05 (×3): qty 1

## 2019-11-05 MED ORDER — TIROFIBAN (AGGRASTAT) BOLUS VIA INFUSION
INTRAVENOUS | Status: DC | PRN
  Administered 2019-11-05: 2007.5 ug via INTRAVENOUS

## 2019-11-05 MED ORDER — HYDRALAZINE HCL 20 MG/ML IJ SOLN: 10.0000 mg | INTRAMUSCULAR | Status: AC | PRN

## 2019-11-05 MED ORDER — LABETALOL HCL 5 MG/ML IV SOLN: 10.0000 mg | INTRAVENOUS | Status: AC | PRN

## 2019-11-05 MED ORDER — TIROFIBAN HCL IN NACL 5-0.9 MG/100ML-% IV SOLN
INTRAVENOUS | Status: AC
  Filled 2019-11-05: qty 100

## 2019-11-05 MED ORDER — HEPARIN SODIUM (PORCINE) 5000 UNIT/ML IJ SOLN
INTRAMUSCULAR | Status: AC
  Administered 2019-11-05: 5000 [IU]
  Filled 2019-11-05: qty 1

## 2019-11-05 MED ORDER — ONDANSETRON HCL 4 MG/2ML IJ SOLN: 4.0000 mg | Freq: Four times a day (QID) | INTRAMUSCULAR | Status: DC | PRN

## 2019-11-05 MED ORDER — ACETAMINOPHEN 325 MG PO TABS
650.0000 mg | ORAL_TABLET | ORAL | Status: DC | PRN
  Administered 2019-11-06: 650 mg via ORAL
  Filled 2019-11-05: qty 2

## 2019-11-05 MED ORDER — LIDOCAINE HCL (PF) 1 % IJ SOLN
INTRAMUSCULAR | Status: DC | PRN
  Administered 2019-11-05: 2 mL

## 2019-11-05 MED ORDER — VERAPAMIL HCL 2.5 MG/ML IV SOLN
INTRAVENOUS | Status: AC
  Filled 2019-11-05: qty 2

## 2019-11-05 SURGICAL SUPPLY — 20 items
BALLN SAPPHIRE 2.5X15 (BALLOONS) ×2
BALLN SAPPHIRE ~~LOC~~ 3.25X15 (BALLOONS) ×2 IMPLANT
BALLN SAPPHIRE ~~LOC~~ 3.5X12 (BALLOONS) ×2 IMPLANT
BALLOON SAPPHIRE 2.5X15 (BALLOONS) ×1 IMPLANT
CATH IMPULSE 5F ANG/FL3.5 (CATHETERS) ×2 IMPLANT
CATH INFINITI 5FR ANG PIGTAIL (CATHETERS) ×2 IMPLANT
CATH TELESCOPE 6F GEC (CATHETERS) ×2 IMPLANT
CATH VISTA GUIDE 6FR XBLAD3.5 (CATHETERS) ×2 IMPLANT
DEVICE RAD COMP TR BAND LRG (VASCULAR PRODUCTS) ×2 IMPLANT
GLIDESHEATH SLEND SS 6F .021 (SHEATH) ×2 IMPLANT
GUIDEWIRE INQWIRE 1.5J.035X260 (WIRE) ×1 IMPLANT
INQWIRE 1.5J .035X260CM (WIRE) ×2
KIT ENCORE 26 ADVANTAGE (KITS) ×2 IMPLANT
KIT HEART LEFT (KITS) ×2 IMPLANT
PACK CARDIAC CATHETERIZATION (CUSTOM PROCEDURE TRAY) ×2 IMPLANT
STENT SYNERGY XD 3.0X32 (Permanent Stent) ×2 IMPLANT
SYR MEDRAD MARK 7 150ML (SYRINGE) ×2 IMPLANT
TRANSDUCER W/STOPCOCK (MISCELLANEOUS) ×2 IMPLANT
TUBING CIL FLEX 10 FLL-RA (TUBING) ×2 IMPLANT
WIRE COUGAR XT STRL 190CM (WIRE) ×2 IMPLANT

## 2019-11-05 NOTE — H&P (Addendum)
Cardiology Admission History and Physical:   Patient ID: Alfred Clay MRN: [id number]; DOB: 11-Dec-1950   Admission date: 11/05/2019  Primary Care Provider: Shelda Pal, DO Primary Cardiologist: No primary care provider on file. [place] Primary Electrophysiologist:  None   Chief Complaint: Chest pain  Patient Profile:   Alfred Clay is a 69 y.o. male with diabetes here with chest discomfort, EKG changes concerning for ST elevation myocardial infarction  History of Present Illness:   Alfred Clay 69 year old male with diabetes, hyperlipidemia with prior esophageal stricture here with chest discomfort, severe pressure started approximately 730 this morning.  He has had a cough off and on over the past 2 weeks he states.  No fevers no night sweats no weight loss.  No Covid contacts.  He has been evaluated most recently by gastroenterology, and his past medical history has a designation of aorto esophageal fistula however upon further questioning, he does not recall ever having this condition or any surgery surrounding this condition.  I have removed from his past medical history.  Currently he is in the emergency room.  Original EKG from EMS does demonstrate ST segment elevation in V2 V3 V4, and V3 approximately 2 mm.  Second EKG performed after troponin of approximately 2000 returned demonstrates decreased ST segment changes with subtle T wave inversion in V3.  He still having ongoing mild chest pressure.  States that he has been taking a statin.  Denies any fevers chills nausea vomiting syncope bleeding orthopnea.  Has never had prior heart issues.  No early family history of coronary artery disease.  Non-smoker.  Heart Pathway Score:     Past Medical History:  Diagnosis Date  . Acquired esophageal ring   . Aorto-esophageal fistula   . Cancer ([place])    skin- upper back and lower back  . Cataract    bilateral-removed  . DM type 2 (diabetes mellitus, type 2)  ([place])   . GERD (gastroesophageal reflux disease)   . History of esophagitis   . Hyperlipidemia    no meds  . Hypertension    no meds    Past Surgical History:  Procedure Laterality Date  . BIOPSY  08/06/2019   Procedure: BIOPSY;  Surgeon: Lavena Bullion, DO;  Location: [facility];  Service: Gastroenterology;;  . CATARACT EXTRACTION Bilateral 10/2018  . COLONOSCOPY  2018  . ESOPHAGOGASTRODUODENOSCOPY  10/16/2018  . ESOPHAGOGASTRODUODENOSCOPY (EGD) WITH PROPOFOL N/A 08/06/2019   Procedure: ESOPHAGOGASTRODUODENOSCOPY (EGD) WITH PROPOFOL;  Surgeon: Lavena Bullion, DO;  Location: [facility];  Service: Gastroenterology;  Laterality: N/A;  with dilation using ped scope  . HEMORRHOID SURGERY    . SAVORY DILATION N/A 08/06/2019   Procedure: SAVORY DILATION;  Surgeon: Lavena Bullion, DO;  Location: [facility];  Service: Gastroenterology;  Laterality: N/A;  . SKIN CANCER EXCISION     back for skin cancer  . UPPER GASTROINTESTINAL ENDOSCOPY    . WISDOM TOOTH EXTRACTION       Medications Prior to Admission: Prior to Admission medications   Medication Sig Start Date End Date Taking? Authorizing Provider  Apoaequorin (PREVAGEN EXTRA STRENGTH) 20 MG CAPS Take 20 mg by mouth daily.   Yes [provider]  atorvastatin (LIPITOR) 10 MG tablet Take 1 tablet (10 mg total) by mouth daily. 10/16/19  Yes Shelda Pal, DO  metFORMIN (GLUCOPHAGE) 500 MG tablet Take 500 mg by mouth 2 (two) times daily with a meal.   Yes [provider]  pantoprazole (PROTONIX) 40  MG tablet Take 1 tablet (40 mg total) by mouth 2 (two) times daily. 07/03/19 11/05/19 Yes Cirigliano, Vito V, DO  sucralfate (CARAFATE) 1 GM/10ML suspension Take 10 mLs (1 g total) by mouth 2 (two) times daily. 10/07/19  Yes Cirigliano, Vito V, DO     Allergies:    Allergies  Allergen Reactions  . Ace Inhibitors Cough    Social History:   Social History   Socioeconomic History  . Marital status:  Married    Spouse name: Not on file  . Number of children: Not on file  . Years of education: Not on file  . Highest education level: Not on file  Occupational History  . Not on file  Tobacco Use  . Smoking status: Never Smoker  . Smokeless tobacco: Never Used  Substance and Sexual Activity  . Alcohol use: Yes    Comment: ocassionally liquor  . Drug use: Not Currently  . Sexual activity: Not on file  Other Topics Concern  . Not on file  Social History Narrative  . Not on file   Social Determinants of Health   Financial Resource Strain:   . Difficulty of Paying Living Expenses: Not on file  Food Insecurity:   . Worried About Charity fundraiser in the Last Year: Not on file  . Ran Out of Food in the Last Year: Not on file  Transportation Needs:   . Lack of Transportation (Medical): Not on file  . Lack of Transportation (Non-Medical): Not on file  Physical Activity:   . Days of Exercise per Week: Not on file  . Minutes of Exercise per Session: Not on file  Stress:   . Feeling of Stress : Not on file  Social Connections:   . Frequency of Communication with Friends and Family: Not on file  . Frequency of Social Gatherings with Friends and Family: Not on file  . Attends Religious Services: Not on file  . Active Member of Clubs or Organizations: Not on file  . Attends Archivist Meetings: Not on file  . Marital Status: Not on file  Intimate Partner Violence:   . Fear of Current or Ex-Partner: Not on file  . Emotionally Abused: Not on file  . Physically Abused: Not on file  . Sexually Abused: Not on file    Family History:   The patient's family history includes Colon cancer in his mother; Colon polyps in his mother; Diabetes in his mother. There is no history of Esophageal cancer, Rectal cancer, or Stomach cancer.    ROS:  Please see the history of present illness.  All other ROS reviewed and negative.     Physical Exam/Data:   Vitals:   11/05/19 1245  11/05/19 1300 11/05/19 1315 11/05/19 1330  BP: 115/71 113/72 115/75 114/76  Pulse: (!) 58 65 62 66  Resp: 15 12 13 13   Temp:      TempSrc:      SpO2: 99% 100% 99% 99%   No intake or output data in the 24 hours ending 11/05/19 1416 Last 3 Weights 10/28/2019 10/16/2019 10/07/2019  Weight (lbs) 177 lb 174 lb 170 lb  Weight (kg) 80.287 kg 78.926 kg 77.111 kg     There is no height or weight on file to calculate BMI.  General:  Well nourished, well developed, in no acute distress, mildly uncomfortable HEENT: normal Lymph: no adenopathy Neck: no JVD Endocrine:  No thryomegaly Vascular: No carotid bruits; FA pulses 2+ bilaterally without bruits  Cardiac:  normal S1, S2; RRR; no murmur  Lungs:  clear to auscultation bilaterally, no wheezing, rhonchi or rales  Abd: soft, nontender, no hepatomegaly  Ext: no edema Musculoskeletal:  No deformities, BUE and BLE strength normal and equal Skin: warm and dry  Neuro:  CNs 2-12 intact, no focal abnormalities noted Psych:  Normal affect    EKG:  The ECG that was done  was personally reviewed and demonstrates as described above in HPI  Relevant CV Studies: Catheterization pending  Laboratory Data:  High Sensitivity Troponin:   Recent Labs  Lab 11/05/19 1126  TROPONINIHS 2,289*      Chemistry Recent Labs  Lab 11/05/19 1126  NA 138  K 4.4  CL 103  CO2 24  GLUCOSE 154*  BUN 19  CREATININE 1.10  CALCIUM 9.5  GFRNONAA >60  GFRAA >60  ANIONGAP 11    No results for input(s): PROT, ALBUMIN, AST, ALT, ALKPHOS, BILITOT in the last 168 hours. Hematology Recent Labs  Lab 11/05/19 1126  WBC 10.5  RBC 4.41  HGB 13.5  HCT 40.4  MCV 91.6  MCH 30.6  MCHC 33.4  RDW 12.5  PLT 237   BNPNo results for input(s): BNP, PROBNP in the last 168 hours.  DDimer No results for input(s): DDIMER in the last 168 hours.   Radiology/Studies:  DG Chest Portable 1 View  Result Date: 11/05/2019 CLINICAL DATA:  Chest pain EXAM: PORTABLE CHEST 1 VIEW  COMPARISON:  None. FINDINGS: The heart size and mediastinal contours are within normal limits. Both lungs are clear. The visualized skeletal structures are unremarkable. Linear radiopacity projecting over the left hilum which may reflect a foreign body versus an item superficial to the patient. IMPRESSION: No acute cardiopulmonary disease. Electronically Signed   By: Kathreen Devoid   On: 11/05/2019 12:19       TIMI Risk Score for ST  Elevation MI:   The patient's TIMI risk score is 4, which indicates a 7.3% risk of all cause mortality at 30 days.    Assessment and Plan:   Anterior ST elevation myocardial infarction -Risk factors include diabetes, hyperlipidemia -EKG changes are slightly improving but he is still having chest pressure. -We will probably bring up to cardiac catheterization lab.  Risk and benefits have been discussed including stroke heart attack death renal impairment bleeding.  He is willing to proceed. -We will assess lipids, echocardiogram, continue to follow CBC and basic metabolic profile  Diabetes with hyperlipidemia -Continue with statin.  Insulin sliding scale. -Continue to hold Metformin   I discussed his care with Dr. Angelena Form of interventional cardiology team.  Severity of Illness: The appropriate patient status for this patient is INPATIENT. Inpatient status is judged to be reasonable and necessary in order to provide the required intensity of service to ensure the patient's safety. The patient's presenting symptoms, physical exam findings, and initial radiographic and laboratory data in the context of their chronic comorbidities is felt to place them at high risk for further clinical deterioration. Furthermore, it is not anticipated that the patient will be medically stable for discharge from the hospital within 2 midnights of admission. The following factors support the patient status of inpatient.   " The patient's presenting symptoms include ST elevation  myocardial infarction. " The worrisome physical exam findings include chest pain. " The initial radiographic and laboratory data are worrisome because of EKG changes. " The chronic co-morbidities include diabetes.   * I certify that at the point of admission it  is my clinical judgment that the patient will require inpatient hospital care spanning beyond 2 midnights from the point of admission due to high intensity of service, high risk for further deterioration and high frequency of surveillance required.*    For questions or updates, please contact [place] Please consult [email or web address] for contact info under        Signed, Candee Furbish, MD  11/05/2019 2:16 PM

## 2019-11-05 NOTE — ED Provider Notes (Signed)
[facility] EMERGENCY DEPARTMENT Provider Note   CSN: [id number] Arrival date & time: 11/05/19  1111     History Chief Complaint  Patient presents with  . Chest Pain    Alfred Clay is a 69 y.o. male with a past medical history significant for hypertension, hyperlipidemia, GERD, diabetes mellitus type 2, and esophageal stenosis who presents to the ED via EMS due to central chest pressure that started this morning around 0730. Patient notes chest pain radiates to both arms associated with a tingling sensation. When chest pain first started, he was sitting at his computer and coughed. He admits to having a productive cough with yellow phlegm for the past 2 weeks. Denies sick contacts and COVID exposures.  Patient denies association with exertion.  Denies nausea vomiting.  Admits to associated shortness of breath.  Patient denies tobacco and drug use.  Admits to occasional alcohol use.  Denies family history of early CAD.  Denies previous episodes of chest pain.  Patient denies history of blood clots, recent surgeries, recent long immobilizations, lower extremity edema, and hormonal treatments.   En route to the ED patient was given 1 324 ASA and 1 SL nitro with relief of chest pressure.  Past Medical History:  Diagnosis Date  . Acquired esophageal ring   . Aorto-esophageal fistula   . Cancer ([place])    skin- upper back and lower back  . Cataract    bilateral-removed  . DM type 2 (diabetes mellitus, type 2) ([place])   . GERD (gastroesophageal reflux disease)   . History of esophagitis   . Hyperlipidemia    no meds  . Hypertension    no meds    Patient Active Problem List   Diagnosis Date Noted  . Onychomycosis 10/16/2019  . Hypertension   . History of esophagitis   . DM type 2 (diabetes mellitus, type 2) ([place])   . Esophageal dysphagia   . Esophageal stenosis   . Gastroesophageal reflux disease with esophagitis without hemorrhage     Past Surgical History:    Procedure Laterality Date  . BIOPSY  08/06/2019   Procedure: BIOPSY;  Surgeon: Lavena Bullion, DO;  Location: [facility];  Service: Gastroenterology;;  . CATARACT EXTRACTION Bilateral 10/2018  . COLONOSCOPY  2018  . ESOPHAGOGASTRODUODENOSCOPY  10/16/2018  . ESOPHAGOGASTRODUODENOSCOPY (EGD) WITH PROPOFOL N/A 08/06/2019   Procedure: ESOPHAGOGASTRODUODENOSCOPY (EGD) WITH PROPOFOL;  Surgeon: Lavena Bullion, DO;  Location: [facility];  Service: Gastroenterology;  Laterality: N/A;  with dilation using ped scope  . HEMORRHOID SURGERY    . SAVORY DILATION N/A 08/06/2019   Procedure: SAVORY DILATION;  Surgeon: Lavena Bullion, DO;  Location: [facility];  Service: Gastroenterology;  Laterality: N/A;  . SKIN CANCER EXCISION     back for skin cancer  . UPPER GASTROINTESTINAL ENDOSCOPY    . WISDOM TOOTH EXTRACTION         Family History  Problem Relation Age of Onset  . Diabetes Mother   . Colon cancer Mother   . Colon polyps Mother   . Esophageal cancer Neg Hx   . Rectal cancer Neg Hx   . Stomach cancer Neg Hx     Social History   Tobacco Use  . Smoking status: Never Smoker  . Smokeless tobacco: Never Used  Substance Use Topics  . Alcohol use: Yes    Comment: ocassionally liquor  . Drug use: Not Currently    Home Medications Prior to Admission medications   Medication Sig  Start Date End Date Taking? Authorizing Provider  Apoaequorin (PREVAGEN EXTRA STRENGTH) 20 MG CAPS Take 20 mg by mouth daily.    [provider]  atorvastatin (LIPITOR) 10 MG tablet Take 1 tablet (10 mg total) by mouth daily. 10/16/19   Shelda Pal, DO  metFORMIN (GLUCOPHAGE) 500 MG tablet Take 500 mg by mouth 2 (two) times daily with a meal.    [provider]  pantoprazole (PROTONIX) 40 MG tablet Take 1 tablet (40 mg total) by mouth 2 (two) times daily. 07/03/19 10/01/19  Cirigliano, Vito V, DO  pantoprazole (PROTONIX) 40 MG tablet Take 40 mg by mouth 2 (two) times  daily.    [provider]  sucralfate (CARAFATE) 1 GM/10ML suspension Take 10 mLs (1 g total) by mouth 2 (two) times daily. 10/07/19   Cirigliano, Vito V, DO    Allergies    Ace inhibitors  Review of Systems   Review of Systems  Constitutional: Negative for chills and fever.  Respiratory: Positive for cough and shortness of breath.   Cardiovascular: Positive for chest pain. Negative for leg swelling.  Gastrointestinal: Negative for abdominal pain, diarrhea, nausea and vomiting.  All other systems reviewed and are negative.   Physical Exam Updated Vital Signs BP 127/78 (BP Location: Right Arm)   Pulse 70   Temp 98.5 F (36.9 C) (Oral)   Resp 13   SpO2 100%   Physical Exam Vitals and nursing note reviewed.  Constitutional:      General: He is not in acute distress.    Appearance: He is not toxic-appearing.  HENT:     Head: Normocephalic.  Eyes:     Pupils: Pupils are equal, round, and reactive to light.  Cardiovascular:     Rate and Rhythm: Normal rate and regular rhythm.     Pulses: Normal pulses.     Heart sounds: Normal heart sounds. No murmur. No friction rub. No gallop.   Pulmonary:     Effort: Pulmonary effort is normal.     Breath sounds: Normal breath sounds.  Chest:     Comments: No anterior chest wall tenderness. Abdominal:     General: Abdomen is flat. Bowel sounds are normal. There is no distension.     Palpations: Abdomen is soft.     Tenderness: There is no abdominal tenderness. There is no guarding or rebound.  Musculoskeletal:     Cervical back: Neck supple.     Comments: Able to move all 4 extremities without difficulty.  No lower extremity edema.  Negative Homans sign bilaterally.  Skin:    General: Skin is warm and dry.  Neurological:     General: No focal deficit present.     Mental Status: He is alert.  Psychiatric:        Mood and Affect: Mood normal.        Behavior: Behavior normal.     ED Results / Procedures / Treatments     Labs (all labs ordered are listed, but only abnormal results are displayed) Labs Reviewed  CBC WITH DIFFERENTIAL/PLATELET  BASIC METABOLIC PANEL  TROPONIN I (HIGH SENSITIVITY)    EKG EKG Interpretation  Date/Time:  Tuesday November 05 2019 11:18:44 EST Ventricular Rate:  67 PR Interval:    QRS Duration: 92 QT Interval:  398 QTC Calculation: 421 R Axis:   47 Text Interpretation: Sinus rhythm Low voltage, precordial leads Anteroseptal infarct, old Confirmed by Davonna Belling [id number]) on 11/05/2019 11:21:07 AM   Radiology No results found.  Procedures Procedures (including critical care time)  Medications Ordered in ED Medications - No data to display  ED Course  I have reviewed the triage vital signs and the nursing notes.  Pertinent labs & imaging results that were available during my care of the patient were reviewed by me and considered in my medical decision making (see chart for details).  Clinical Course as of Nov 04 1398  Tue Nov 05, 2019  1220 Troponin I (High Sensitivity)(!!): 2,289 [CA]  1359 Dr. Alvino Chapel discussed case with cardiology who agrees to admit patient for further treatment and take patient to the cath lab.    [CA]    Clinical Course User Index [CA] Suzy Bouchard, PA-C   MDM Rules/Calculators/A&P                     69 year old male with a history of diabetes, hyperlipidemia, and hypertension who presents to the ED due to central chest pressure that radiates to his bilateral arms associated with shortness of breath and tingling sensation.  Vitals all within normal limits.  Patient is afebrile, not tachycardic or hypoxic.  Patient in no acute distress and nontoxic-appearing.  Physical exam reassuring.  No lower extremity edema.  Negative Homans sign bilaterally.  Doubt PE/DVT.  Will obtain routine labs, EKG, chest x-ray, and troponin to rule out ACS.   CBC reassuring with no leukocytosis.  Initial EKG personally reviewed which demonstrates  sinus rhythm with no signs of new ischemia.  Urine which is negative for signs of pneumonia, pneumothorax, or widened mediastinum.  BMP reassuring with normal renal function and no electrolyte abnormalities with mild hyperglycemia at 154.  Initial troponin 2289. Consult to pharmacy for heparin dose. Will consult cardiology for admission. 2 hour COVID test placed.  Discussed case with Dr. Alvino Chapel who evaluated patient at bedside and agrees with assessment and plan.  Cardiology agrees to admit patient and take him to the cath lab for further treatment.  Final Clinical Impression(s) / ED Diagnoses Final diagnoses:  None    Rx / DC Orders ED Discharge Orders    None       Karie Kirks 11/05/19 1406    Davonna Belling, MD 11/06/19 [phone]

## 2019-11-05 NOTE — Plan of Care (Signed)
  Problem: Clinical Measurements: Goal: Ability to maintain clinical measurements within normal limits will improve Outcome: Progressing Goal: Respiratory complications will improve Outcome: Progressing Goal: Cardiovascular complication will be avoided Outcome: Progressing   Problem: Activity: Goal: Risk for activity intolerance will decrease Outcome: Progressing   Problem: Nutrition: Goal: Adequate nutrition will be maintained Outcome: Progressing   Problem: Coping: Goal: Level of anxiety will decrease Outcome: Progressing   Problem: Pain Managment: Goal: General experience of comfort will improve Outcome: Progressing   Problem: Safety: Goal: Ability to remain free from injury will improve Outcome: Progressing   Problem: Skin Integrity: Goal: Risk for impaired skin integrity will decrease Outcome: Progressing   Problem: Education: Goal: Understanding of medication regimen will improve Outcome: Progressing   Problem: Cardiac: Goal: Ability to achieve and maintain adequate cardiopulmonary perfusion will improve Outcome: Progressing Goal: Vascular access site(s) Level 0-1 will be maintained Outcome: Progressing

## 2019-11-05 NOTE — ED Notes (Signed)
This RN took a critical lab call for the primary RN for this pt. Troponin: 2,289. Mykenzie RN was notified.

## 2019-11-05 NOTE — ED Triage Notes (Addendum)
Pt here from home via [facility] for cp that began at 0730 this morning. Pt was at work, went home and pain got worse, radiating to both shoulders and arms. EMS gave 324mg  of aspirin and 1 SL nitro w/ relief. Per pt, pain is now a 3/10. Pt also endorse having a cough the last 2 weeks. AOx4, VSS

## 2019-11-06 ENCOUNTER — Encounter: Payer: Self-pay | Admitting: Gastroenterology

## 2019-11-06 ENCOUNTER — Inpatient Hospital Stay (HOSPITAL_COMMUNITY): Payer: Managed Care, Other (non HMO)

## 2019-11-06 DIAGNOSIS — I2510 Atherosclerotic heart disease of native coronary artery without angina pectoris: Secondary | ICD-10-CM

## 2019-11-06 LAB — CBC
HCT: 37.5 % — ABNORMAL LOW (ref 39.0–52.0)
Hemoglobin: 12.6 g/dL — ABNORMAL LOW (ref 13.0–17.0)
MCH: 30.7 pg (ref 26.0–34.0)
MCHC: 33.6 g/dL (ref 30.0–36.0)
MCV: 91.5 fL (ref 80.0–100.0)
Platelets: 244 10*3/uL (ref 150–400)
RBC: 4.1 MIL/uL — ABNORMAL LOW (ref 4.22–5.81)
RDW: 12.6 % (ref 11.5–15.5)
WBC: 8.2 10*3/uL (ref 4.0–10.5)
nRBC: 0 % (ref 0.0–0.2)

## 2019-11-06 LAB — ECHOCARDIOGRAM COMPLETE
Height: 70.984 in
Weight: 2698.43 oz

## 2019-11-06 LAB — BASIC METABOLIC PANEL
Anion gap: 12 (ref 5–15)
BUN: 16 mg/dL (ref 8–23)
CO2: 23 mmol/L (ref 22–32)
Calcium: 9.1 mg/dL (ref 8.9–10.3)
Chloride: 101 mmol/L (ref 98–111)
Creatinine, Ser: 0.9 mg/dL (ref 0.61–1.24)
GFR calc Af Amer: >60 mL/min (ref >60)
GFR calc non Af Amer: >60 mL/min (ref >60)
Glucose, Bld: 143 mg/dL — ABNORMAL HIGH (ref 70–99)
Potassium: 4.2 mmol/L (ref 3.5–5.1)
Sodium: 136 mmol/L (ref 135–145)

## 2019-11-06 MED ORDER — PERFLUTREN LIPID MICROSPHERE
1.0000 mL | INTRAVENOUS | Status: AC | PRN
  Filled 2019-11-06: qty 10

## 2019-11-06 MED ORDER — ENOXAPARIN SODIUM 40 MG/0.4ML ~~LOC~~ SOLN
40.0000 mg | SUBCUTANEOUS | Status: DC
  Administered 2019-11-06 – 2019-11-07 (×2): 40 mg via SUBCUTANEOUS
  Filled 2019-11-06 (×2): qty 0.4

## 2019-11-06 MED ORDER — METOPROLOL TARTRATE 25 MG PO TABS
25.0000 mg | ORAL_TABLET | Freq: Two times a day (BID) | ORAL | Status: DC
  Administered 2019-11-06 (×3): 25 mg via ORAL
  Filled 2019-11-06 (×5): qty 1

## 2019-11-06 MED ORDER — CHLORHEXIDINE GLUCONATE CLOTH 2 % EX PADS
6.0000 | MEDICATED_PAD | Freq: Every day | CUTANEOUS | Status: DC
  Administered 2019-11-06: 6 via TOPICAL

## 2019-11-06 MED ORDER — PERFLUTREN LIPID MICROSPHERE
INTRAVENOUS | Status: AC
  Administered 2019-11-06: 09:00:00 4 mL via INTRAVENOUS
  Filled 2019-11-06: qty 10

## 2019-11-06 MED ORDER — LOSARTAN POTASSIUM 25 MG PO TABS
25.0000 mg | ORAL_TABLET | Freq: Every day | ORAL | Status: DC
  Administered 2019-11-06 – 2019-11-08 (×2): 25 mg via ORAL
  Filled 2019-11-06 (×3): qty 1

## 2019-11-06 MED FILL — Verapamil HCl IV Soln 2.5 MG/ML: INTRAVENOUS | Qty: 2 | Status: AC

## 2019-11-06 NOTE — Plan of Care (Addendum)
In for MI anterior, stent placement LAD   Nursing Dx Knowledge deficit related to MI  Interventions  Cardiac rehabilitation to educate Discuss and teach back medications and side effects Give reading material   Speak to wife as well regarding  medications and diet plans. EF 30-35%  ARB ordered, did get slightly hypotensive Plan to transfer today when bed available.

## 2019-11-06 NOTE — Progress Notes (Signed)
  Echocardiogram 2D Echocardiogram with definity has been performed.  Darlina Sicilian M 11/06/2019, 9:11 AM

## 2019-11-06 NOTE — Progress Notes (Addendum)
Progress Note  Patient Name: Alfred Clay Date of Encounter: 11/06/2019  Primary Cardiologist: No primary care provider on file. New: Dr. Marlou Porch    Patient Profile:    Alfred Clay is a 69 y.o. male with diabetes here with chest discomfort, EKG changes concerning for ST elevation myocardial infarction  Subjective   Patient was seen and evaluated at bedside. He reports some chest pain with deep breath. This is different than the chest pain he had yesterday and thinks that this is his stomach. No SOB. No palpitation.  No acute events overnight after heart cath. Tele shows some pvc.  Inpatient Medications    Scheduled Meds:  aspirin  81 mg Oral Daily   atorvastatin  80 mg Oral Daily   Chlorhexidine Gluconate Cloth  6 each Topical Daily   losartan  25 mg Oral Daily   metoprolol tartrate  25 mg Oral BID   pantoprazole  40 mg Oral BID   sodium chloride flush  3 mL Intravenous Q12H   ticagrelor  90 mg Oral BID   Continuous Infusions:  sodium chloride     PRN Meds: sodium chloride, acetaminophen, ondansetron (ZOFRAN) IV, sodium chloride flush   Vital Signs    Vitals:   11/06/19 0700 11/06/19 0800 11/06/19 0829 11/06/19 0954  BP: 101/65 92/60  112/62  Pulse: 84 80    Resp: 14 20    Temp:   98.7 F (37.1 C)   TempSrc:   Oral   SpO2: 99% 99%    Weight:      Height:        Intake/Output Summary (Last 24 hours) at 11/06/2019 1050 Last data filed at 11/06/2019 0700 Gross per 24 hour  Intake 554.93 ml  Output 2000 ml  Net -1445.07 ml   Last 3 Weights 11/06/2019 11/05/2019 10/28/2019  Weight (lbs) 168 lb 10.4 oz 176 lb 12.9 oz 177 lb  Weight (kg) 76.5 kg 80.2 kg 80.287 kg      Telemetry    Sinus with some PVC - Personally Reviewed  ECG    T inversion in v2-v4, Q wave in v4. Slight ST changes (improved) - Personally Reviewed  Physical Exam   GEN: No acute distress.   Neck: No JVD Cardiac: RRR, no murmurs, rubs, or gallops.  Respiratory: Clear to  auscultation bilaterally. GI: Soft, nontender, non-distended  MS: No edema; No deformity. Neuro:  Nonfocal  Psych: Normal affect   Labs    High Sensitivity Troponin:   Recent Labs  Lab 11/05/19 1126 11/05/19 1354  TROPONINIHS 2,289* 15,868*      Chemistry Recent Labs  Lab 11/05/19 1126 11/06/19 0334  NA 138 136  K 4.4 4.2  CL 103 101  CO2 24 23  GLUCOSE 154* 143*  BUN 19 16  CREATININE 1.10 0.90  CALCIUM 9.5 9.1  GFRNONAA >60 >60  GFRAA >60 >60  ANIONGAP 11 12     Hematology Recent Labs  Lab 11/05/19 1126 11/06/19 0334  WBC 10.5 8.2  RBC 4.41 4.10*  HGB 13.5 12.6*  HCT 40.4 37.5*  MCV 91.6 91.5  MCH 30.6 30.7  MCHC 33.4 33.6  RDW 12.5 12.6  PLT 237 244    BNPNo results for input(s): BNP, PROBNP in the last 168 hours.   DDimer No results for input(s): DDIMER in the last 168 hours.   Radiology    CARDIAC CATHETERIZATION  Result Date: 11/05/2019  Prox RCA to Mid RCA lesion is 10% stenosed.  1st Mrg  lesion is 30% stenosed.  Mid Cx to Dist Cx lesion is 20% stenosed.  Prox LAD to Mid LAD lesion is 99% stenosed.  Mid LAD lesion is 30% stenosed.  Dist LAD lesion is 20% stenosed.  A drug-eluting stent was successfully placed using a SYNERGY XD 3.0X32.  Post intervention, there is a 0% residual stenosis.  The left ventricular systolic function is normal.  LV end diastolic pressure is normal.  The left ventricular ejection fraction is 35-45% by visual estimate.  There is no mitral valve regurgitation.  1. NSTEMI secondary to sub-total occlusion of the mid LAD 2. The LAD is a large caliber vessel that courses to the apex. The mid vessel has a thrombotic sub-total (99%) occlusion with flow into the distal vessel. 3. The Circumflex has mild disease 4. The RCA is a large dominant vessel with mild mid disease 5. Successful PTCA/DES x 1 mid LAD 6. Moderate segmental LV systolic dysfunction with hypokinesis of the anterior wall, apex and inferoapical wall  Recommendation: Will admit to the ICU. Will continue Aggrastat for 2 hours post PCI. Continue ASA and Brilinta for one year. Continue high intensity statin. Will start low dose beta blocker. Echo in am.   DG Chest Portable 1 View  Result Date: 11/05/2019 CLINICAL DATA:  Chest pain EXAM: PORTABLE CHEST 1 VIEW COMPARISON:  None. FINDINGS: The heart size and mediastinal contours are within normal limits. Both lungs are clear. The visualized skeletal structures are unremarkable. Linear radiopacity projecting over the left hilum which may reflect a foreign body versus an item superficial to the patient. IMPRESSION: No acute cardiopulmonary disease. Electronically Signed   By: Kathreen Devoid   On: 11/05/2019 12:19    Cardiac Studies  Cath 11/05/2019 Prox RCA to Mid RCA lesion is 10% stenosed. 1st Mrg lesion is 30% stenosed. Mid Cx to Dist Cx lesion is 20% stenosed. Prox LAD to Mid LAD lesion is 99% stenosed. Mid LAD lesion is 30% stenosed. Dist LAD lesion is 20% stenosed. A drug-eluting stent was successfully placed using a SYNERGY XD 3.0X32. Post intervention, there is a 0% residual stenosis. The left ventricular systolic function is normal. LV end diastolic pressure is normal. The left ventricular ejection fraction is 35-45% by visual estimate. There is no mitral valve regurgitation.   1. NSTEMI secondary to sub-total occlusion of the mid LAD 2. The LAD is a large caliber vessel that courses to the apex. The mid vessel has a thrombotic sub-total (99%) occlusion with flow into the distal vessel.  3. The Circumflex has mild disease 4. The RCA is a large dominant vessel with mild mid disease 5. Successful PTCA/DES x 1 mid LAD 6. Moderate segmental LV systolic dysfunction with hypokinesis of the anterior wall, apex and inferoapical wall   Recommendation: Will admit to the ICU. Will continue Aggrastat for 2 hours post PCI. Continue ASA and Brilinta for one year. Continue high intensity statin. Will  start low dose beta blocker. Echo in am.   Diagnostic Dominance: Right  Intervention     Patient Profile     69 y.o. male with diabetes here with chest discomfort, EKG changes concerning for ST elevation myocardial infarction. Underwent heart cath and successful LAD stent.  Assessment & Plan    Anterior STEMI: Door to cath time ~3h.  Cardiac cath; subtotal LAD occlusion, EF 35-50% S/p successful PCI and DES in mid LAD Received Aggrastat 2 h post cath likely due to ruptured plaque and thrombus. Has some pleuritic chest pain that  seems non cardiac.  EKG showed improvement.  -Continue ASA and Brilinta -Continue lipitor 80 mg QD -Increase Metoprolol to 25 mg BID -Start Losartan 25 mg  -Continue cardiac monitoring -F/u Echo  DMII: On Metformin at home. -SSI in hospital -May resume Metformin in 48 h after cath (tomorrow) -Consider adding SGLT -2 inhibitors after discharge -CBG monitoring  HLD:  Lipid Panel     Component Value Date/Time   CHOL 212 (H) 10/16/2019 0828   TRIG 59.0 10/16/2019 0828   HDL 57.20 10/16/2019 0828   CHOLHDL 4 10/16/2019 0828   VLDL 11.8 10/16/2019 0828   LDLCALC 143 (H) 10/16/2019 0828   Only on 10 mg of lipitor at home -High intensity statin -Goal LDL<70  For questions or updates, please contact [facility] Please consult [email or web address] for contact info under        Signed, Dewayne Hatch, MD  11/06/2019, 10:50 AM    Agree with Dr Myrtie Hawk  Mr. Pelz was admitted with an anterior STEMI.  He underwent PCI and stenting of his mid LAD by Dr. Angelena Form.  His EF is in the 30 to 35% range.  His troponins rose to greater than 15,000.  His initial pain has resolved but he does have some pleuritic chest pain.  His EKG shows changes consistent with an anterior STEMI in evolution with T wave inversion.  He has no symptoms of heart failure.  He is ambulating in the room.  Exam is benign.  He stable for transfer to telemetry.  Cardiac  rehab will ambulate.  2D echo is pending.  If his EF is less than 35% he may be a candidate for a LifeVest.  Otherwise, he is on appropriate pharmacology including DAPT, high-dose statin therapy, beta-blocker and ARB.  Lorretta Harp, M.D., [place], [facility], [place] [address] [place], [place]  [address]  [phone] 11/06/2019 12:03 PM

## 2019-11-06 NOTE — Progress Notes (Addendum)
CARDIAC REHAB PHASE I   PRE:  Rate/Rhythm: 86 SR    BP: sitting 93/68    SaO2: 99 RA  MODE:  Ambulation: 370 ft   POST:  Rate/Rhythm: 96 SR    BP: sitting 106/72     SaO2: 100 RA  Pt eager to walk. Slow and steady, no c/o. VSS. To recliner. Discussed MI, stent, restrictions, Brilinta, diet (HH and DM), exercise, NTG, daily wts, and CRPII with pt and wife. Voiced understanding. Will refer to [facility]. He understands the need for Brilinta. His BP is running low in recliner, 81/68. Notified RN, which she sts he had his BB. Pt asx. Encouraged more walking.  [place], ACSM 11/06/2019 1:37 PM

## 2019-11-07 MED ORDER — METFORMIN HCL 500 MG PO TABS
500.0000 mg | ORAL_TABLET | Freq: Two times a day (BID) | ORAL | Status: DC
  Administered 2019-11-08: 500 mg via ORAL
  Filled 2019-11-07: qty 1

## 2019-11-07 MED ORDER — INSULIN ASPART 100 UNIT/ML ~~LOC~~ SOLN: 0.0000 [IU] | Freq: Three times a day (TID) | SUBCUTANEOUS | Status: DC

## 2019-11-07 MED ORDER — METFORMIN HCL 500 MG PO TABS: 500.0000 mg | ORAL_TABLET | Freq: Two times a day (BID) | ORAL | Status: DC

## 2019-11-07 NOTE — Progress Notes (Addendum)
Progress Note  Patient Name: Alfred Clay Date of Encounter: 11/07/2019  Primary Cardiologist: No primary care provider on file. New: Dr. Marlou Porch.  Patient Profile:   Alfred Clay a [id number] y.o.malewith diabetes here with chest discomfort, EKG changes concerning for ST elevation myocardial infarction. He underwent heart cath and successful LAD stent placement 11/05/2019.  Subjective   Patient was seen and evaluated at bedside on morning rounds. No chest pain, no SOB. No acute events overnight. He is doing well and no complaints. He did not ambulate that much yet.   Inpatient Medications    Scheduled Meds: . aspirin  81 mg Oral Daily  . atorvastatin  80 mg Oral Daily  . Chlorhexidine Gluconate Cloth  6 each Topical Daily  . enoxaparin (LOVENOX) injection  40 mg Subcutaneous Q24H  . insulin aspart  0-9 Units Subcutaneous TID WC  . losartan  25 mg Oral Daily  . [START ON 11/08/2019] metFORMIN  500 mg Oral BID WC  . metoprolol tartrate  25 mg Oral BID  . pantoprazole  40 mg Oral BID  . sodium chloride flush  3 mL Intravenous Q12H  . ticagrelor  90 mg Oral BID   Continuous Infusions: . sodium chloride     PRN Meds: sodium chloride, acetaminophen, ondansetron (ZOFRAN) IV, sodium chloride flush   Vital Signs    Vitals:   11/06/19 2015 11/06/19 2109 11/07/19 0509 11/07/19 0849  BP:   97/62 (!) 86/61  Pulse:  83 72   Resp:    18  Temp: 99.1 F (37.3 C) 99 F (37.2 C) 99.3 F (37.4 C)   TempSrc: Oral Oral Oral   SpO2:  99% 100%   Weight:  75.3 kg    Height:        Intake/Output Summary (Last 24 hours) at 11/07/2019 1130 Last data filed at 11/06/2019 1200 Gross per 24 hour  Intake 240 ml  Output --  Net 240 ml   Last 3 Weights 11/06/2019 11/06/2019 11/05/2019  Weight (lbs) 166 lb 168 lb 10.4 oz 176 lb 12.9 oz  Weight (kg) 75.297 kg 76.5 kg 80.2 kg      Telemetry    Sinus rythm- Personally Reviewed  ECG    Mild ST elevation and T inversion in V1-V3-  Personally Reviewed  Physical Exam   GEN: No acute distress.   Neck: No JVD Cardiac: RRR, no murmurs, rubs, or gallops.  Respiratory: Clear to auscultation bilaterally. GI: Soft, nontender, non-distended  MS: No edema; No deformity. Neuro:  Nonfocal  Psych: Normal affect   Labs    High Sensitivity Troponin:   Recent Labs  Lab 11/05/19 1126 11/05/19 1354  TROPONINIHS 2,289* 15,868*      Chemistry Recent Labs  Lab 11/05/19 1126 11/06/19 0334  NA 138 136  K 4.4 4.2  CL 103 101  CO2 24 23  GLUCOSE 154* 143*  BUN 19 16  CREATININE 1.10 0.90  CALCIUM 9.5 9.1  GFRNONAA >60 >60  GFRAA >60 >60  ANIONGAP 11 12     Hematology Recent Labs  Lab 11/05/19 1126 11/06/19 0334  WBC 10.5 8.2  RBC 4.41 4.10*  HGB 13.5 12.6*  HCT 40.4 37.5*  MCV 91.6 91.5  MCH 30.6 30.7  MCHC 33.4 33.6  RDW 12.5 12.6  PLT 237 244    BNPNo results for input(s): BNP, PROBNP in the last 168 hours.   DDimer No results for input(s): DDIMER in the last 168 hours.   Radiology  CARDIAC CATHETERIZATION  Result Date: 11/05/2019  Prox RCA to Mid RCA lesion is 10% stenosed.  1st Mrg lesion is 30% stenosed.  Mid Cx to Dist Cx lesion is 20% stenosed.  Prox LAD to Mid LAD lesion is 99% stenosed.  Mid LAD lesion is 30% stenosed.  Dist LAD lesion is 20% stenosed.  A drug-eluting stent was successfully placed using a SYNERGY XD 3.0X32.  Post intervention, there is a 0% residual stenosis.  The left ventricular systolic function is normal.  LV end diastolic pressure is normal.  The left ventricular ejection fraction is 35-45% by visual estimate.  There is no mitral valve regurgitation.  1. NSTEMI secondary to sub-total occlusion of the mid LAD 2. The LAD is a large caliber vessel that courses to the apex. The mid vessel has a thrombotic sub-total (99%) occlusion with flow into the distal vessel. 3. The Circumflex has mild disease 4. The RCA is a large dominant vessel with mild mid disease 5.  Successful PTCA/DES x 1 mid LAD 6. Moderate segmental LV systolic dysfunction with hypokinesis of the anterior wall, apex and inferoapical wall Recommendation: Will admit to the ICU. Will continue Aggrastat for 2 hours post PCI. Continue ASA and Brilinta for one year. Continue high intensity statin. Will start low dose beta blocker. Echo in am.   DG Chest Portable 1 View  Result Date: 11/05/2019 CLINICAL DATA:  Chest pain EXAM: PORTABLE CHEST 1 VIEW COMPARISON:  None. FINDINGS: The heart size and mediastinal contours are within normal limits. Both lungs are clear. The visualized skeletal structures are unremarkable. Linear radiopacity projecting over the left hilum which may reflect a foreign body versus an item superficial to the patient. IMPRESSION: No acute cardiopulmonary disease. Electronically Signed   By: Kathreen Devoid   On: 11/05/2019 12:19   ECHOCARDIOGRAM COMPLETE  Result Date: 11/06/2019    ECHOCARDIOGRAM REPORT   Patient Name:   Alfred Clay Date of Exam: 11/06/2019 Medical Rec #:  [id number]         Height:       71.0 in Accession #:    [id number]        Weight:       168.7 lb Date of Birth:  04-09-1951         BSA:          1.96 m Patient Age:    69 years          BP:           92/60 mmHg Patient Gender: M                 HR:           78 bpm. Exam Location:  Inpatient Procedure: 2D Echo and Intracardiac Opacification Agent Indications:    CAD Native Vessel 414.01 / I25.10  History:        Patient has no prior history of Echocardiogram examinations.                 Acute MI, Abnormal ECG; Risk Factors:Hypertension and                 Dyslipidemia. Chest discomfort. GERD. Aorto-esophageal fistula.  Sonographer:    Darlina Sicilian RDCS Referring Phys: [place]  Sonographer Comments: Technically difficult study due to poor echo windows. IMPRESSIONS  1. Left ventricular ejection fraction, by estimation, is 30 to 35%. The left ventricle has moderately decreased function. The left  ventrical demonstrates regional wall  motion abnormalities. There is apical, mid and apical anterior, apical inferior, apical inferolateral, mid anteroseptal and inferoseptal, apical lateral and apical septal akinesis.Left ventricular diastolic parameters are consistent with Grade I diastolic dysfunction (impaired relaxation).  2. Right ventricular systolic function is normal. The right ventricular size is normal.  3. The mitral valve is normal in structure and function. no evidence of mitral valve regurgitation. No evidence of mitral stenosis.  4. The aortic valve is tricuspid. Aortic valve regurgitation is trivial . Mild to moderate aortic valve sclerosis/calcification is present, without any evidence of aortic stenosis.  5. The inferior vena cava is dilated in size with <50% respiratory variability, suggesting right atrial pressure of 15 mmHg. FINDINGS  Left Ventricle: There is apical, mid and apical anterior, apical inferior, apical inferolateral, mid anteroseptal and inferoseptal, apical lateral and apical septal akinesis. Left ventricular ejection fraction, by estimation, is 30 to 35%. The left ventricle has moderately decreased function. The left ventricle demonstrates regional wall motion abnormalities. The left ventricular internal cavity size was normal in size. There is no. There is no left ventricular hypertrophy. Left ventricular diastolic parameters are consistent with Grade I diastolic dysfunction (impaired relaxation). Right Ventricle: The right ventricular size is normal. No increase in right ventricular wall thickness. Right ventricular systolic function is normal. Left Atrium: Left atrial size was normal in size. Right Atrium: Right atrial size was normal in size. Pericardium: There is no evidence of pericardial effusion. Mitral Valve: The mitral valve is normal in structure and function. Normal mobility of the mitral valve leaflets. No evidence of mitral valve regurgitation. No evidence of mitral  valve stenosis. Tricuspid Valve: The tricuspid valve is normal in structure. Tricuspid valve regurgitation is not demonstrated. No evidence of tricuspid stenosis. Aortic Valve: The aortic valve is tricuspid. Aortic valve regurgitation is trivial. Mild to moderate aortic valve sclerosis/calcification is present, without any evidence of aortic stenosis. There is moderate calcification of the non coronary cusp.. Pulmonic Valve: The pulmonic valve was normal in structure. Pulmonic valve regurgitation is not visualized. No evidence of pulmonic stenosis. Aorta: The aortic root is normal in size and structure. Venous: The inferior vena cava is dilated in size with less than 50% respiratory variability, suggesting right atrial pressure of 15 mmHg. The inferior vena cava and the hepatic vein show a normal flow pattern. IAS/Shunts: No atrial level shunt detected by color flow Doppler.  LEFT VENTRICLE PLAX 2D LVIDd:         3.70 cm       Diastology LVIDs:         3.40 cm       LV e' lateral:   5.77 cm/s LV PW:         1.10 cm       LV E/e' lateral: 8.8 LV IVS:        1.20 cm       LV e' medial:    4.57 cm/s LVOT diam:     1.80 cm       LV E/e' medial:  11.1 LV SV:         45.55 ml LV SV Index:   5.45 LVOT Area:     2.54 cm  LV Volumes (MOD) LV area d, A2C:    36.00 cm LV area d, A4C:    37.50 cm LV area s, A2C:    29.50 cm LV area s, A4C:    28.70 cm LV major d, A2C:   9.54 cm LV major d, A4C:  8.91 cm LV major s, A2C:   8.37 cm LV major s, A4C:   8.18 cm LV vol d, MOD A2C: 111.0 ml LV vol d, MOD A4C: 129.0 ml LV vol s, MOD A2C: 84.3 ml LV vol s, MOD A4C: 87.7 ml LV SV MOD A2C:     26.7 ml LV SV MOD A4C:     129.0 ml LV SV MOD BP:      38.5 ml RIGHT VENTRICLE RV S prime:     16.10 cm/s TAPSE (M-mode): 2.0 cm LEFT ATRIUM           Index       RIGHT ATRIUM           Index LA diam:      3.00 cm 1.53 cm/m  RA Area:     13.50 cm LA Vol (A2C): 31.6 ml 16.11 ml/m RA Volume:   38.00 ml  19.38 ml/m LA Vol (A4C): 30.4 ml 15.50  ml/m  AORTIC VALVE LVOT Vmax:   105.00 cm/s LVOT Vmean:  67.700 cm/s LVOT VTI:    0.179 m  AORTA Ao Root diam: 3.30 cm MITRAL VALVE MV Area (PHT): 3.53 cm             SHUNTS MV Decel Time: 215 msec             Systemic VTI:  0.18 m MV E velocity: 50.90 cm/s 103 cm/s  Systemic Diam: 1.80 cm MV A velocity: 76.60 cm/s 70.3 cm/s MV E/A ratio:  0.66       1.5 Fransico Him MD Electronically signed by Fransico Him MD Signature Date/Time: 11/06/2019/1:10:47 PM    Final     Cardiac Studies   Cath 11/05/2019  Prox RCA to Mid RCA lesion is 10% stenosed.  1st Mrg lesion is 30% stenosed.  Mid Cx to Dist Cx lesion is 20% stenosed.  Prox LAD to Mid LAD lesion is 99% stenosed.  Mid LAD lesion is 30% stenosed.  Dist LAD lesion is 20% stenosed.  A drug-eluting stent was successfully placed using a SYNERGY XD 3.0X32.  Post intervention, there is a 0% residual stenosis.  The left ventricular systolic function is normal.  LV end diastolic pressure is normal.  The left ventricular ejection fraction is 35-45% by visual estimate.  There is no mitral valve regurgitation.  1. NSTEMI secondary to sub-total occlusion of the mid LAD 2. The LAD is a large caliber vessel that courses to the apex. The mid vessel has a thrombotic sub-total (99%) occlusion with flow into the distal vessel.  3. The Circumflex has mild disease 4. The RCA is a large dominant vessel with mild mid disease 5. Successful PTCA/DES x 1 mid LAD 6. Moderate segmental LV systolic dysfunction with hypokinesis of the anterior wall, apex and inferoapical wall  Recommendation: Will admit to the ICU. Will continue Aggrastat for 2 hours post PCI. Continue ASA and Brilinta for one year. Continue high intensity statin. Will start low dose beta blocker. Echo in am.   Diagnostic Dominance: Right  Intervention      Patient Profile     69 y.o. male with diabetes here with chest discomfort, EKG changes concerning for ST elevation  myocardial infarction. Underwent heart cath and successful LAD stent.   Assessment & Plan   Anterior STEMI:  Cardiac cath: subtotal LAD occlusion, EF 35-50% S/p successful PCI and DES in mid LAD Received Aggrastat 2 h post cath likely due to ruptured plaque and thrombus. No more  chest pain. He is on appropriate CAD/post stent   medication.  Had some soft BP yesterday after starting ACE I and increasing BB. He is asymptomatic.  Will continue current meds with holding parameters and will monitor BP.  Echo performed 2/10: EF 30-35%, with regional LV wall motion abnormality  -Continue ASA and Brilinta -Continue lipitor 80 mg QD -Continue Metoprolol to 25 mg BID. (with holding parameter) -Continue  Losartan 25 mg  -Continue cardiac monitoring -Echo: EF 30-35%. In setting of ant MI and decreased EF at 30%, patient has indication for life west x 3 month. Ordered placed.   DMII: On Metformin at home. -Started on SSI today  in hospital -CBG monitoring  -Will resume Metformin in 48 h after cath (First dose tomorrow AM) -Consider adding SGLT -2 inhibitors after discharge   HLD:  Only on 10 mg of lipitor at home -High intensity statin -Goal LDL<70  For questions or updates, please contact [facility] Please consult [email or web address] for contact info under -       Signed, Dewayne Hatch, MD  11/07/2019, 11:30 AM     Agree with note by Dr Myrtie Hawk   Lorretta Harp, M.D., [place], [facility], [place]ver[place] [address] [place], [place]  [address]  [phone] 11/08/2019 9:56 AM

## 2019-11-07 NOTE — Progress Notes (Signed)
BP 86/61, held losartan and metoprolol, Reino Bellis NP made aware. Pt asymptomatic. Cont to monitor. Carroll Kinds RN

## 2019-11-07 NOTE — Care Management (Signed)
1020 11-07-19 Benefits Check submitted for Brilinta. Will make patient aware of cost once submitted. Bethena Roys, RN,BSN Case Manager

## 2019-11-07 NOTE — Progress Notes (Signed)
CARDIAC REHAB PHASE I   Offered to walk with pt. Pt states he recently returned from a walk with his wife. BP checked, 94/64. Pt denies SOB, pain, or dizziness. Reviewed importance of daily weights, low sodium diet, and ambulation. Pt to d/c with life vest tomorrow. Referred to CRP II [place].  [id number] Rufina Falco, RN BSN 11/07/2019 1:42 PM

## 2019-11-07 NOTE — TOC Benefit Eligibility Note (Signed)
Transition of Care [facility]) Benefit Eligibility Note    Patient Details  Name: Alfred Clay MRN: [id number] Date of Birth: [birth year]   Medication/Dose: BRILINTA 90 MG BID  Covered?: Yes  Tier: 2 Drug  Prescription Coverage Preferred Pharmacy: [facility]  Spoke with Person/Company/Phone Number:: JOYCE  @  [place] [employer] #  [phone]  Co-Pay: $110.00  Prior Approval: No  Deductible: (NO DEDUCTIBLE WITH PLAN  /  OUT-OF-POCKET: NOT MET)  Additional Notes: TICAGRELOR : Crecencio Mc Phone Number: 11/07/2019, 2:24 PM

## 2019-11-07 NOTE — Care Management (Signed)
1248 11-07-19 Information for Life Vest faxed to [facility]. Awaiting insurance authorization. Bethena Roys, RN,BSN Case Manager.

## 2019-11-07 NOTE — Progress Notes (Addendum)
    Patient meets criteria for lifevest. Orders faxed and rep notified.   TTE: 11/06/19  IMPRESSIONS    1. Left ventricular ejection fraction, by estimation, is 30 to 35%. The  left ventricle has moderately decreased function. The left ventrical  demonstrates regional wall motion abnormalities. There is apical, mid and  apical anterior, apical inferior,  apical inferolateral, mid anteroseptal and inferoseptal, apical lateral  and apical septal akinesis.Left ventricular diastolic parameters are  consistent with Grade I diastolic dysfunction (impaired relaxation).  2. Right ventricular systolic function is normal. The right ventricular  size is normal.  3. The mitral valve is normal in structure and function. no evidence of  mitral valve regurgitation. No evidence of mitral stenosis.  4. The aortic valve is tricuspid. Aortic valve regurgitation is trivial .  Mild to moderate aortic valve sclerosis/calcification is present, without  any evidence of aortic stenosis.  5. The inferior vena cava is dilated in size with <50% respiratory  variability, suggesting right atrial pressure of 15 mmHg.   Cath: 11/05/19   Prox RCA to Mid RCA lesion is 10% stenosed.  1st Mrg lesion is 30% stenosed.  Mid Cx to Dist Cx lesion is 20% stenosed.  Prox LAD to Mid LAD lesion is 99% stenosed.  Mid LAD lesion is 30% stenosed.  Dist LAD lesion is 20% stenosed.  A drug-eluting stent was successfully placed using a SYNERGY XD 3.0X32.  Post intervention, there is a 0% residual stenosis.  The left ventricular systolic function is normal.  LV end diastolic pressure is normal.  The left ventricular ejection fraction is 35-45% by visual estimate.  There is no mitral valve regurgitation.   1. NSTEMI secondary to sub-total occlusion of the mid LAD 2. The LAD is a large caliber vessel that courses to the apex. The mid vessel has a thrombotic sub-total (99%) occlusion with flow into the distal  vessel.  3. The Circumflex has mild disease 4. The RCA is a large dominant vessel with mild mid disease 5. Successful PTCA/DES x 1 mid LAD 6. Moderate segmental LV systolic dysfunction with hypokinesis of the anterior wall, apex and inferoapical wall  Diagnostic Dominance: Right  Intervention     Signed, Reino Bellis, NP-C 11/07/2019, 11:20 AM Pager: [phone]   Agree with note by Reino Bellis NP-C  Patient's repeat echo showed continued reduction in EF in the 30% range.  Based on this he meets criteria for LifeVest which we will pursue prior to discharge.  Lorretta Harp, M.D., [place], [facility], [place] [address] [place], [place]  [address]  [phone] 11/08/2019 9:55 AM

## 2019-11-08 ENCOUNTER — Telehealth: Payer: Self-pay | Admitting: Cardiology

## 2019-11-08 DIAGNOSIS — I255 Ischemic cardiomyopathy: Secondary | ICD-10-CM

## 2019-11-08 LAB — BASIC METABOLIC PANEL
Anion gap: 9 (ref 5–15)
BUN: 20 mg/dL (ref 8–23)
CO2: 24 mmol/L (ref 22–32)
Calcium: 8.8 mg/dL — ABNORMAL LOW (ref 8.9–10.3)
Chloride: 104 mmol/L (ref 98–111)
Creatinine, Ser: 1.04 mg/dL (ref 0.61–1.24)
GFR calc Af Amer: >60 mL/min (ref >60)
GFR calc non Af Amer: >60 mL/min (ref >60)
Glucose, Bld: 130 mg/dL — ABNORMAL HIGH (ref 70–99)
Potassium: 3.7 mmol/L (ref 3.5–5.1)
Sodium: 137 mmol/L (ref 135–145)

## 2019-11-08 LAB — GLUCOSE, CAPILLARY: Glucose-Capillary: 112 mg/dL — ABNORMAL HIGH (ref 70–99)

## 2019-11-08 MED ORDER — NITROGLYCERIN 0.4 MG SL SUBL: 0.4000 mg | SUBLINGUAL_TABLET | SUBLINGUAL | 12 refills | Status: DC | PRN

## 2019-11-08 MED ORDER — TICAGRELOR 90 MG PO TABS: 90.0000 mg | ORAL_TABLET | Freq: Two times a day (BID) | ORAL | 11 refills | Status: DC

## 2019-11-08 MED ORDER — ATORVASTATIN CALCIUM 80 MG PO TABS: 80.0000 mg | ORAL_TABLET | Freq: Every day | ORAL | 6 refills | Status: DC

## 2019-11-08 MED ORDER — METOPROLOL SUCCINATE ER 25 MG PO TB24: 12.5000 mg | ORAL_TABLET | Freq: Every day | ORAL | 6 refills | Status: DC

## 2019-11-08 MED ORDER — ASPIRIN EC 81 MG PO TBEC: 81.0000 mg | DELAYED_RELEASE_TABLET | Freq: Every day | ORAL | 3 refills | Status: AC

## 2019-11-08 MED FILL — ASPIRIN 81 MG TBEC: 81 | 90 days supply | Qty: 90 | Fill #0

## 2019-11-08 MED FILL — METOPROLOL SUCCINATE ER 25: 25 | 30 days supply | Qty: 30 | Fill #0

## 2019-11-08 MED FILL — ATORVASTATIN CALCIUM 80 MG: 80 | 30 days supply | Qty: 30 | Fill #0

## 2019-11-08 MED FILL — NITROGLYCERIN 0.4 MG TAB SL: 0.4 | 8 days supply | Qty: 25 | Fill #0

## 2019-11-08 MED FILL — BRILINTA 90 MG TABLET: 90 | 30 days supply | Qty: 60 | Fill #0

## 2019-11-08 NOTE — Telephone Encounter (Signed)
New message   Per Vin Bhagat setup a TOC visit with Truitt Merle for 11/20/19 at 2:15 pm.

## 2019-11-08 NOTE — Telephone Encounter (Signed)
The pt is being discharged from the hospital today. We will call him Monday 2/15.

## 2019-11-08 NOTE — Progress Notes (Addendum)
Progress Note  Patient Name: Alfred Clay Date of Encounter: 11/08/2019  Primary Cardiologist: No primary care provider on file. New: Dr. Marlou Porch  Patient profile   Alfred Clay is a 69 y.o. male with diabetes here with chest discomfort, EKG changes concerning for ST elevation myocardial infarction. He underwent heart cath and successful LAD stent placement 11/05/2019. EF 30-35%. He met the criteria for lifevest.  Subjective   Patient was seen and evaluated at bedside this morning rounds. No acute events overnight. No complaints. Denies chest pain, SOB, dizziness or palpitation. He ambulated well in the hallway without difficulty. He has received the lifevest. He feels comfortable to go home. Treatment plan after discharge and follow up visits explained.  Inpatient Medications    Scheduled Meds:  aspirin  81 mg Oral Daily   atorvastatin  80 mg Oral Daily   Chlorhexidine Gluconate Cloth  6 each Topical Daily   enoxaparin (LOVENOX) injection  40 mg Subcutaneous Q24H   insulin aspart  0-9 Units Subcutaneous TID WC   losartan  25 mg Oral Daily   metFORMIN  500 mg Oral BID WC   metoprolol tartrate  25 mg Oral BID   pantoprazole  40 mg Oral BID   sodium chloride flush  3 mL Intravenous Q12H   ticagrelor  90 mg Oral BID   Continuous Infusions:  sodium chloride     PRN Meds: sodium chloride, acetaminophen, ondansetron (ZOFRAN) IV, sodium chloride flush   Vital Signs    Vitals:   11/07/19 1332 11/07/19 1540 11/07/19 2014 11/08/19 0624  BP: 94/64 91/65 90/74 95/65  Pulse:  90 80   Resp:   18 17  Temp:  98.9 F (37.2 C) 98.3 F (36.8 C) 98.9 F (37.2 C)  TempSrc:  Oral Oral Oral  SpO2:  100% 100% 98%  Weight:    78.4 kg  Height:        Intake/Output Summary (Last 24 hours) at 11/08/2019 0721 Last data filed at 11/07/2019 1300 Gross per 24 hour  Intake 418 ml  Output --  Net 418 ml   Last 3 Weights 11/08/2019 11/06/2019 11/06/2019  Weight (lbs) 172 lb 13.5 oz 166  lb 168 lb 10.4 oz  Weight (kg) 78.4 kg 75.297 kg 76.5 kg      Telemetry    Sinus - Personally Reviewed  ECG    Not performed today  Physical Exam   GEN: No acute distress.   Neck: No JVD Cardiac: RRR, no murmurs, rubs, or gallops.  Respiratory: Clear to auscultation bilaterally. GI: Soft, nontender, non-distended  MS: No edema; No deformity. Neuro:  Nonfocal  Psych: Normal affect   Labs    High Sensitivity Troponin:   Recent Labs  Lab 11/05/19 1126 11/05/19 1354  TROPONINIHS 2,289* 15,868*      Chemistry Recent Labs  Lab 11/05/19 1126 11/06/19 0334  NA 138 136  K 4.4 4.2  CL 103 101  CO2 24 23  GLUCOSE 154* 143*  BUN 19 16  CREATININE 1.10 0.90  CALCIUM 9.5 9.1  GFRNONAA >60 >60  GFRAA >60 >60  ANIONGAP 11 12     Hematology Recent Labs  Lab 11/05/19 1126 11/06/19 0334  WBC 10.5 8.2  RBC 4.41 4.10*  HGB 13.5 12.6*  HCT 40.4 37.5*  MCV 91.6 91.5  MCH 30.6 30.7  MCHC 33.4 33.6  RDW 12.5 12.6  PLT 237 244    BNPNo results for input(s): BNP, PROBNP in the last 168 hours.  DDimer No results for input(s): DDIMER in the last 168 hours.   Radiology    ECHOCARDIOGRAM COMPLETE  Result Date: 11/06/2019    ECHOCARDIOGRAM REPORT   Patient Name:   Alfred Clay Date of Exam: 11/06/2019 Medical Rec #:  [id number]         Height:       71.0 in Accession #:    [id number]        Weight:       168.7 lb Date of Birth:  1950/12/23         BSA:          1.96 m Patient Age:    64 years          BP:           92/60 mmHg Patient Gender: M                 HR:           78 bpm. Exam Location:  Inpatient Procedure: 2D Echo and Intracardiac Opacification Agent Indications:    CAD Native Vessel 414.01 / I25.10  History:        Patient has no prior history of Echocardiogram examinations.                 Acute MI, Abnormal ECG; Risk Factors:Hypertension and                 Dyslipidemia. Chest discomfort. GERD. Aorto-esophageal fistula.  Sonographer:    Darlina Sicilian RDCS  Referring Phys: [place]  Sonographer Comments: Technically difficult study due to poor echo windows. IMPRESSIONS  1. Left ventricular ejection fraction, by estimation, is 30 to 35%. The left ventricle has moderately decreased function. The left ventrical demonstrates regional wall motion abnormalities. There is apical, mid and apical anterior, apical inferior, apical inferolateral, mid anteroseptal and inferoseptal, apical lateral and apical septal akinesis.Left ventricular diastolic parameters are consistent with Grade I diastolic dysfunction (impaired relaxation).  2. Right ventricular systolic function is normal. The right ventricular size is normal.  3. The mitral valve is normal in structure and function. no evidence of mitral valve regurgitation. No evidence of mitral stenosis.  4. The aortic valve is tricuspid. Aortic valve regurgitation is trivial . Mild to moderate aortic valve sclerosis/calcification is present, without any evidence of aortic stenosis.  5. The inferior vena cava is dilated in size with <50% respiratory variability, suggesting right atrial pressure of 15 mmHg. FINDINGS  Left Ventricle: There is apical, mid and apical anterior, apical inferior, apical inferolateral, mid anteroseptal and inferoseptal, apical lateral and apical septal akinesis. Left ventricular ejection fraction, by estimation, is 30 to 35%. The left ventricle has moderately decreased function. The left ventricle demonstrates regional wall motion abnormalities. The left ventricular internal cavity size was normal in size. There is no. There is no left ventricular hypertrophy. Left ventricular diastolic parameters are consistent with Grade I diastolic dysfunction (impaired relaxation). Right Ventricle: The right ventricular size is normal. No increase in right ventricular wall thickness. Right ventricular systolic function is normal. Left Atrium: Left atrial size was normal in size. Right Atrium: Right  atrial size was normal in size. Pericardium: There is no evidence of pericardial effusion. Mitral Valve: The mitral valve is normal in structure and function. Normal mobility of the mitral valve leaflets. No evidence of mitral valve regurgitation. No evidence of mitral valve stenosis. Tricuspid Valve: The tricuspid valve is normal in structure. Tricuspid valve regurgitation is not demonstrated.  No evidence of tricuspid stenosis. Aortic Valve: The aortic valve is tricuspid. Aortic valve regurgitation is trivial. Mild to moderate aortic valve sclerosis/calcification is present, without any evidence of aortic stenosis. There is moderate calcification of the non coronary cusp.. Pulmonic Valve: The pulmonic valve was normal in structure. Pulmonic valve regurgitation is not visualized. No evidence of pulmonic stenosis. Aorta: The aortic root is normal in size and structure. Venous: The inferior vena cava is dilated in size with less than 50% respiratory variability, suggesting right atrial pressure of 15 mmHg. The inferior vena cava and the hepatic vein show a normal flow pattern. IAS/Shunts: No atrial level shunt detected by color flow Doppler.  LEFT VENTRICLE PLAX 2D LVIDd:         3.70 cm       Diastology LVIDs:         3.40 cm       LV e' lateral:   5.77 cm/s LV PW:         1.10 cm       LV E/e' lateral: 8.8 LV IVS:        1.20 cm       LV e' medial:    4.57 cm/s LVOT diam:     1.80 cm       LV E/e' medial:  11.1 LV SV:         45.55 ml LV SV Index:   5.45 LVOT Area:     2.54 cm  LV Volumes (MOD) LV area d, A2C:    36.00 cm LV area d, A4C:    37.50 cm LV area s, A2C:    29.50 cm LV area s, A4C:    28.70 cm LV major d, A2C:   9.54 cm LV major d, A4C:   8.91 cm LV major s, A2C:   8.37 cm LV major s, A4C:   8.18 cm LV vol d, MOD A2C: 111.0 ml LV vol d, MOD A4C: 129.0 ml LV vol s, MOD A2C: 84.3 ml LV vol s, MOD A4C: 87.7 ml LV SV MOD A2C:     26.7 ml LV SV MOD A4C:     129.0 ml LV SV MOD BP:      38.5 ml RIGHT  VENTRICLE RV S prime:     16.10 cm/s TAPSE (M-mode): 2.0 cm LEFT ATRIUM           Index       RIGHT ATRIUM           Index LA diam:      3.00 cm 1.53 cm/m  RA Area:     13.50 cm LA Vol (A2C): 31.6 ml 16.11 ml/m RA Volume:   38.00 ml  19.38 ml/m LA Vol (A4C): 30.4 ml 15.50 ml/m  AORTIC VALVE LVOT Vmax:   105.00 cm/s LVOT Vmean:  67.700 cm/s LVOT VTI:    0.179 m  AORTA Ao Root diam: 3.30 cm MITRAL VALVE MV Area (PHT): 3.53 cm             SHUNTS MV Decel Time: 215 msec             Systemic VTI:  0.18 m MV E velocity: 50.90 cm/s 103 cm/s  Systemic Diam: 1.80 cm MV A velocity: 76.60 cm/s 70.3 cm/s MV E/A ratio:  0.66       1.5 Fransico Him MD Electronically signed by Fransico Him MD Signature Date/Time: 11/06/2019/1:10:47 PM    Final     Cardiac Studies  Echo 11/07/2019 IMPRESSIONS     1. Left ventricular ejection fraction, by estimation, is 30 to 35%. The  left ventricle has moderately decreased function. The left ventrical  demonstrates regional wall motion abnormalities. There is apical, mid and  apical anterior, apical inferior,  apical inferolateral, mid anteroseptal and inferoseptal, apical lateral  and apical septal akinesis.Left ventricular diastolic parameters are  consistent with Grade I diastolic dysfunction (impaired relaxation).   2. Right ventricular systolic function is normal. The right ventricular  size is normal.   3. The mitral valve is normal in structure and function. no evidence of  mitral valve regurgitation. No evidence of mitral stenosis.   4. The aortic valve is tricuspid. Aortic valve regurgitation is trivial .  Mild to moderate aortic valve sclerosis/calcification is present, without  any evidence of aortic stenosis.   5. The inferior vena cava is dilated in size with <50% respiratory  variability, suggesting right atrial pressure of 15 mmHg.  Cath 11/05/2019 Prox RCA to Mid RCA lesion is 10% stenosed. 1st Mrg lesion is 30% stenosed. Mid Cx to Dist Cx lesion is  20% stenosed. Prox LAD to Mid LAD lesion is 99% stenosed. Mid LAD lesion is 30% stenosed. Dist LAD lesion is 20% stenosed. A drug-eluting stent was successfully placed using a SYNERGY XD 3.0X32. Post intervention, there is a 0% residual stenosis. The left ventricular systolic function is normal. LV end diastolic pressure is normal. The left ventricular ejection fraction is 35-45% by visual estimate. There is no mitral valve regurgitation.   1. NSTEMI secondary to sub-total occlusion of the mid LAD 2. The LAD is a large caliber vessel that courses to the apex. The mid vessel has a thrombotic sub-total (99%) occlusion with flow into the distal vessel.  3. The Circumflex has mild disease 4. The RCA is a large dominant vessel with mild mid disease 5. Successful PTCA/DES x 1 mid LAD 6. Moderate segmental LV systolic dysfunction with hypokinesis of the anterior wall, apex and inferoapical wall   Recommendation: Will admit to the ICU. Will continue Aggrastat for 2 hours post PCI. Continue ASA and Brilinta for one year. Continue high intensity statin. Will start low dose beta blocker. Echo in am.    Diagnostic Dominance: Right  Intervention         Patient Profile     69 y.o. male with diabetes here with chest discomfort, EKG changes concerning for ST elevation myocardial infarction. He underwent heart cath and successful LAD stent placement 11/05/2019. EF 30-35%. He met the criteria for lifevest.    Assessment & Plan    Anterior STEMI:   Cardiac cath: subtotal LAD occlusion, EF 35-50% S/p successful PCI and DES in mid LAD Received Aggrastat 2 h post cath likely due to ruptured plaque and thrombus. No more chest pain. He is on appropriate CAD/post stent   medication.  Echo performed 2/10: EF 30-35%, with regional LV wall motion abnormality. He met the criteria for LifeVest.  Tolerated ambulation well.   -Stable to DC home with current meds and lifeVest. -Continue ASA and  Brilinta (unintruoted for a year) -Continue lipitor 80 mg QD -Continue Metoprolol to 25 mg BID. -Continue  Losartan 25 mg  -He received the lifeVest to use for 3 month.  -F/u outpatient with cardiology team in 1 week and with Dr. Marlou Porch in 1 month  DMII: On Metformin at home. -Resumed Metformin today (in 48 h after cath)-Consider adding SGLT -2 inhibitors at outpatient setting    HLD:  Was on 10 mg of lipitor at home -Increased to 80 mg this hospitalization -DC home with 80 mg lipitor QD -Goal LDL<70  For questions or updates, please contact [place] Please consult [email or web address] for contact info under        Signed, Dewayne Hatch, MD  11/08/2019, 7:21 AM     Agree with note by Dr Myrtie Hawk  Patient was admitted with an anterior STEMI.  There was a delayed presentation due to waiting in the ER apparently.  He had an LAD stent.  His follow-up echo revealed EF of 30 to 35%.  He was fitted for a LifeVest yesterday.  Is on appropriate dual antiplatelet therapy including medicines for ischemic cardiomyopathy.  Exam is benign.  He denies chest pain.  He stable for discharge home today.  Will follow up with Dr. Marlou Porch as an outpatient.  Lorretta Harp, M.D., [place], [facility], [place] [address]. [place], [place]  [address]  [phone] 11/08/2019 12:11 PM

## 2019-11-08 NOTE — Progress Notes (Signed)
CARDIAC REHAB PHASE I   PRE:  Rate/Rhythm: 80 SR    BP: sitting 94/69    SaO2:  MODE:  Ambulation: 470 ft   POST:  Rate/Rhythm: 102 ST    BP: sitting 105/67     SaO2:   Tolerated well, no c/o. BP stable but did not receive BB. Reviewed ed. Gave videos to view. [place], ACSM 11/08/2019 9:43 AM

## 2019-11-08 NOTE — Discharge Summary (Addendum)
Discharge Summary    Patient ID: Alfred Clay MRN: [id number]; DOB: [birth year]  Admit date: 11/05/2019 Discharge date: 11/08/2019  Primary Care Provider: Shelda Pal, DO  Primary Cardiologist: New to Dr. Marlou Porch   Discharge Diagnoses    Active Problems:   Non-ST elevation (NSTEMI) myocardial infarction [facility])   NSTEMI (non-ST elevated myocardial infarction) [facility])   CAD   HTN   Possible anterior STEMI   HLD  Diagnostic Studies/Procedures   Cath 11/05/2019 Prox RCA to Mid RCA lesion is 10% stenosed. 1st Mrg lesion is 30% stenosed. Mid Cx to Dist Cx lesion is 20% stenosed. Prox LAD to Mid LAD lesion is 99% stenosed. Mid LAD lesion is 30% stenosed. Dist LAD lesion is 20% stenosed. A drug-eluting stent was successfully placed using a SYNERGY XD 3.0X32. Post intervention, there is a 0% residual stenosis. The left ventricular systolic function is normal. LV end diastolic pressure is normal. The left ventricular ejection fraction is 35-45% by visual estimate. There is no mitral valve regurgitation.   1. NSTEMI secondary to sub-total occlusion of the mid LAD 2. The LAD is a large caliber vessel that courses to the apex. The mid vessel has a thrombotic sub-total (99%) occlusion with flow into the distal vessel.  3. The Circumflex has mild disease 4. The RCA is a large dominant vessel with mild mid disease 5. Successful PTCA/DES x 1 mid LAD 6. Moderate segmental LV systolic dysfunction with hypokinesis of the anterior wall, apex and inferoapical wall   Recommendation: Will admit to the ICU. Will continue Aggrastat for 2 hours post PCI. Continue ASA and Brilinta for one year. Continue high intensity statin. Will start low dose beta blocker. Echo in am.  Diagnostic Dominance: Right  Intervention         TTE: 11/06/19   IMPRESSIONS     1. Left ventricular ejection fraction, by estimation, is 30 to 35%. The  left ventricle has moderately decreased function. The  left ventrical  demonstrates regional wall motion abnormalities. There is apical, mid and  apical anterior, apical inferior,  apical inferolateral, mid anteroseptal and inferoseptal, apical lateral  and apical septal akinesis.Left ventricular diastolic parameters are  consistent with Grade I diastolic dysfunction (impaired relaxation).   2. Right ventricular systolic function is normal. The right ventricular  size is normal.   3. The mitral valve is normal in structure and function. no evidence of  mitral valve regurgitation. No evidence of mitral stenosis.   4. The aortic valve is tricuspid. Aortic valve regurgitation is trivial .  Mild to moderate aortic valve sclerosis/calcification is present, without  any evidence of aortic stenosis.   5. The inferior vena cava is dilated in size with <50% respiratory  variability, suggesting right atrial pressure of 15 mmHg.    History of Present Illness     Alfred Clay is a 69 y.o. male with diabetes,  hyperlipidemia and  prior esophageal stricture presented with chest discomfort, EKG changes concerning for ST elevation myocardial infarction.  Presented with acute onset chest pain. He has had a cough off and on over the past 2 weeks he states.  No fevers no night sweats no weight loss.  No Covid contacts. Original EKG from EMS does demonstrate ST segment elevation in V2 V3 V4, and V3 approximately 2 mm. Second EKG performed after troponin of approximately 2000 returned demonstrates decreased ST segment changes with subtle T wave inversion in V3.    Hospital Course     Consultants: None  1. Anterior STEMI/NSTEMI - Emergent cath showed thrombotic sub-total occlusion of the mid LAD s/p successful PTCA/DES x 1 mid LAD and moderate segmental LV systolic dysfunction with hypokinesis of the anterior wall, apex and inferoapical wall. Hs-troponin peaked > 15000. Echo with  2/10: EF 30-35%, with regional LV wall motion abnormality.  No recurrent chest  pain.  Seen by cardiac rehab.  Treated with dual antiplatelet therapy aspirin and Brilinta.  He was started on metoprolol 25 twice daily and losartan 25 daily however his blood pressure became soft.  At discharge discontinue his losartan.  Change beta-blocker to Toprol-XL 12.5 mg daily. Titrate medication as outpatient.  2.  Chronic systolic heart failure -Echocardiogram with LV function of 30 to 35%.  Unable to start guideline directed heart failure regimen due to soft blood pressure at discharge. Euvolemic.  Medication changes as above.  3.  Hyperlipidemia -Continue high intensity statin - 10/16/2019: Cholesterol 212; HDL 57.20; LDL Cholesterol 143; Triglycerides 59.0; VLDL 11.8  -Lipid panel and LFTs in 6 to 8 weeks  4.  Diabetes mellitus -On Metformin -Hemoglobin A1c 6.5   Did the patient have an acute coronary syndrome (MI, NSTEMI, STEMI, etc) this admission?:  Yes                               AHA/ACC Clinical Performance & Quality Measures: Aspirin prescribed? - Yes ADP Receptor Inhibitor (Plavix/Clopidogrel, Brilinta/Ticagrelor or Effient/Prasugrel) prescribed (includes medically managed patients)? - Yes Beta Blocker prescribed? - Yes High Intensity Statin (Lipitor 40-80mg  or Crestor 20-40mg ) prescribed? - Yes EF assessed during THIS hospitalization? - Yes For EF <40%, was ACEI/ARB prescribed? - No - Reason:  soft BP For EF <40%, Aldosterone Antagonist (Spironolactone or Eplerenone) prescribed? - No - Reason:  soft BP Cardiac Rehab Phase II ordered (Included Medically managed Patients)? - Yes   _____________  Discharge Vitals Blood pressure 91/68, pulse 86, temperature 98.9 F (37.2 C), temperature source Oral, resp. rate 17, height 5' 10.98" (1.803 m), weight 78.4 kg, SpO2 98 %.  Filed Weights   11/06/19 0500 11/06/19 2109 11/08/19 0624  Weight: 76.5 kg 75.3 kg 78.4 kg    Labs & Radiologic Studies    CBC Recent Labs    11/05/19 1126 11/06/19 0334  WBC 10.5 8.2    NEUTROABS 9.1*  --   HGB 13.5 12.6*  HCT 40.4 37.5*  MCV 91.6 91.5  PLT 237 [id number]   Basic Metabolic Panel Recent Labs    11/06/19 0334 11/08/19 0808  NA 136 137  K 4.2 3.7  CL 101 104  CO2 23 24  GLUCOSE 143* 130*  BUN 16 20  CREATININE 0.90 1.04  CALCIUM 9.1 8.8*   High Sensitivity Troponin:   Recent Labs  Lab 11/05/19 1126 11/05/19 1354  TROPONINIHS 2,289* 15,868*   _____________  CARDIAC CATHETERIZATION  Result Date: 11/05/2019  Prox RCA to Mid RCA lesion is 10% stenosed.  1st Mrg lesion is 30% stenosed.  Mid Cx to Dist Cx lesion is 20% stenosed.  Prox LAD to Mid LAD lesion is 99% stenosed.  Mid LAD lesion is 30% stenosed.  Dist LAD lesion is 20% stenosed.  A drug-eluting stent was successfully placed using a SYNERGY XD 3.0X32.  Post intervention, there is a 0% residual stenosis.  The left ventricular systolic function is normal.  LV end diastolic pressure is normal.  The left ventricular ejection fraction is 35-45% by visual estimate.  There is no  mitral valve regurgitation.  1. NSTEMI secondary to sub-total occlusion of the mid LAD 2. The LAD is a large caliber vessel that courses to the apex. The mid vessel has a thrombotic sub-total (99%) occlusion with flow into the distal vessel. 3. The Circumflex has mild disease 4. The RCA is a large dominant vessel with mild mid disease 5. Successful PTCA/DES x 1 mid LAD 6. Moderate segmental LV systolic dysfunction with hypokinesis of the anterior wall, apex and inferoapical wall Recommendation: Will admit to the ICU. Will continue Aggrastat for 2 hours post PCI. Continue ASA and Brilinta for one year. Continue high intensity statin. Will start low dose beta blocker. Echo in am.   DG Chest Portable 1 View  Result Date: 11/05/2019 CLINICAL DATA:  Chest pain EXAM: PORTABLE CHEST 1 VIEW COMPARISON:  None. FINDINGS: The heart size and mediastinal contours are within normal limits. Both lungs are clear. The visualized skeletal  structures are unremarkable. Linear radiopacity projecting over the left hilum which may reflect a foreign body versus an item superficial to the patient. IMPRESSION: No acute cardiopulmonary disease. Electronically Signed   By: Kathreen Devoid   On: 11/05/2019 12:19   ECHOCARDIOGRAM COMPLETE  Result Date: 11/06/2019    ECHOCARDIOGRAM REPORT   Patient Name:   Alfred Clay Date of Exam: 11/06/2019 Medical Rec #:  [id number]         Height:       71.0 in Accession #:    [id number]        Weight:       168.7 lb Date of Birth:  July 23, 1951         BSA:          1.96 m Patient Age:    58 years          BP:           92/60 mmHg Patient Gender: M                 HR:           78 bpm. Exam Location:  Inpatient Procedure: 2D Echo and Intracardiac Opacification Agent Indications:    CAD Native Vessel 414.01 / I25.10  History:        Patient has no prior history of Echocardiogram examinations.                 Acute MI, Abnormal ECG; Risk Factors:Hypertension and                 Dyslipidemia. Chest discomfort. GERD. Aorto-esophageal fistula.  Sonographer:    Darlina Sicilian RDCS Referring Phys: [place]  Sonographer Comments: Technically difficult study due to poor echo windows. IMPRESSIONS  1. Left ventricular ejection fraction, by estimation, is 30 to 35%. The left ventricle has moderately decreased function. The left ventrical demonstrates regional wall motion abnormalities. There is apical, mid and apical anterior, apical inferior, apical inferolateral, mid anteroseptal and inferoseptal, apical lateral and apical septal akinesis.Left ventricular diastolic parameters are consistent with Grade I diastolic dysfunction (impaired relaxation).  2. Right ventricular systolic function is normal. The right ventricular size is normal.  3. The mitral valve is normal in structure and function. no evidence of mitral valve regurgitation. No evidence of mitral stenosis.  4. The aortic valve is tricuspid. Aortic valve  regurgitation is trivial . Mild to moderate aortic valve sclerosis/calcification is present, without any evidence of aortic stenosis.  5. The inferior vena cava is dilated in size  with <50% respiratory variability, suggesting right atrial pressure of 15 mmHg. FINDINGS  Left Ventricle: There is apical, mid and apical anterior, apical inferior, apical inferolateral, mid anteroseptal and inferoseptal, apical lateral and apical septal akinesis. Left ventricular ejection fraction, by estimation, is 30 to 35%. The left ventricle has moderately decreased function. The left ventricle demonstrates regional wall motion abnormalities. The left ventricular internal cavity size was normal in size. There is no. There is no left ventricular hypertrophy. Left ventricular diastolic parameters are consistent with Grade I diastolic dysfunction (impaired relaxation). Right Ventricle: The right ventricular size is normal. No increase in right ventricular wall thickness. Right ventricular systolic function is normal. Left Atrium: Left atrial size was normal in size. Right Atrium: Right atrial size was normal in size. Pericardium: There is no evidence of pericardial effusion. Mitral Valve: The mitral valve is normal in structure and function. Normal mobility of the mitral valve leaflets. No evidence of mitral valve regurgitation. No evidence of mitral valve stenosis. Tricuspid Valve: The tricuspid valve is normal in structure. Tricuspid valve regurgitation is not demonstrated. No evidence of tricuspid stenosis. Aortic Valve: The aortic valve is tricuspid. Aortic valve regurgitation is trivial. Mild to moderate aortic valve sclerosis/calcification is present, without any evidence of aortic stenosis. There is moderate calcification of the non coronary cusp.. Pulmonic Valve: The pulmonic valve was normal in structure. Pulmonic valve regurgitation is not visualized. No evidence of pulmonic stenosis. Aorta: The aortic root is normal in size  and structure. Venous: The inferior vena cava is dilated in size with less than 50% respiratory variability, suggesting right atrial pressure of 15 mmHg. The inferior vena cava and the hepatic vein show a normal flow pattern. IAS/Shunts: No atrial level shunt detected by color flow Doppler.  LEFT VENTRICLE PLAX 2D LVIDd:         3.70 cm       Diastology LVIDs:         3.40 cm       LV e' lateral:   5.77 cm/s LV PW:         1.10 cm       LV E/e' lateral: 8.8 LV IVS:        1.20 cm       LV e' medial:    4.57 cm/s LVOT diam:     1.80 cm       LV E/e' medial:  11.1 LV SV:         45.55 ml LV SV Index:   5.45 LVOT Area:     2.54 cm  LV Volumes (MOD) LV area d, A2C:    36.00 cm LV area d, A4C:    37.50 cm LV area s, A2C:    29.50 cm LV area s, A4C:    28.70 cm LV major d, A2C:   9.54 cm LV major d, A4C:   8.91 cm LV major s, A2C:   8.37 cm LV major s, A4C:   8.18 cm LV vol d, MOD A2C: 111.0 ml LV vol d, MOD A4C: 129.0 ml LV vol s, MOD A2C: 84.3 ml LV vol s, MOD A4C: 87.7 ml LV SV MOD A2C:     26.7 ml LV SV MOD A4C:     129.0 ml LV SV MOD BP:      38.5 ml RIGHT VENTRICLE RV S prime:     16.10 cm/s TAPSE (M-mode): 2.0 cm LEFT ATRIUM           Index  RIGHT ATRIUM           Index LA diam:      3.00 cm 1.53 cm/m  RA Area:     13.50 cm LA Vol (A2C): 31.6 ml 16.11 ml/m RA Volume:   38.00 ml  19.38 ml/m LA Vol (A4C): 30.4 ml 15.50 ml/m  AORTIC VALVE LVOT Vmax:   105.00 cm/s LVOT Vmean:  67.700 cm/s LVOT VTI:    0.179 m  AORTA Ao Root diam: 3.30 cm MITRAL VALVE MV Area (PHT): 3.53 cm             SHUNTS MV Decel Time: 215 msec             Systemic VTI:  0.18 m MV E velocity: 50.90 cm/s 103 cm/s  Systemic Diam: 1.80 cm MV A velocity: 76.60 cm/s 70.3 cm/s MV E/A ratio:  0.66       1.5 Fransico Him MD Electronically signed by Fransico Him MD Signature Date/Time: 11/06/2019/1:10:47 PM    Final    Disposition   Pt is being discharged home today in good condition.  Follow-up Plans & Appointments    Follow-up  Information     Burtis Junes, NP Follow up on 11/20/2019.   Specialties: Nurse Practitioner, Interventional Cardiology, Cardiology, Radiology Why: @2 :15pm for hosptial follow up with Dr. Marlou Porch PA/NP Contact information: [id number] [place] [address] [phone]           Discharge Instructions     Amb Referral to Cardiac Rehabilitation   Complete by: As directed    Diagnosis:  Coronary Stents NSTEMI PTCA     After initial evaluation and assessments completed: Virtual Based Care may be provided alone or in conjunction with Phase 2 Cardiac Rehab based on patient barriers.: Yes   Diet - low sodium heart healthy   Complete by: As directed    Discharge instructions   Complete by: As directed    NO HEAVY LIFTING (>10lbs) X 2 WEEKS. NO SEXUAL ACTIVITY X 2 WEEKS. NO DRIVING X 1 WEEK. NO SOAKING BATHS, HOT TUBS, POOLS, ETC., X 7 DAYS.   Increase activity slowly   Complete by: As directed        Discharge Medications   Allergies as of 11/08/2019       Reactions   Ace Inhibitors Cough        Medication List     TAKE these medications    aspirin EC 81 MG tablet Take 1 tablet (81 mg total) by mouth daily.   atorvastatin 80 MG tablet Commonly known as: LIPITOR Take 1 tablet (80 mg total) by mouth daily. Start taking on: November 09, 2019 What changed:  medication strength how much to take   metFORMIN 500 MG tablet Commonly known as: GLUCOPHAGE Take 500 mg by mouth 2 (two) times daily with a meal.   metoprolol succinate 25 MG 24 hr tablet Commonly known as: Toprol XL Take 0.5 tablets (12.5 mg total) by mouth daily.   nitroGLYCERIN 0.4 MG SL tablet Commonly known as: Nitrostat Place 1 tablet (0.4 mg total) under the tongue every 5 (five) minutes as needed.   pantoprazole 40 MG tablet Commonly known as: PROTONIX Take 1 tablet (40 mg total) by mouth 2 (two) times daily.   Prevagen Extra Strength 20 MG Caps Generic drug:  Apoaequorin Take 20 mg by mouth daily.   sucralfate 1 GM/10ML suspension Commonly known as: CARAFATE Take 10 mLs (1 g total) by mouth 2 (  two) times daily.   ticagrelor 90 MG Tabs tablet Commonly known as: BRILINTA Take 1 tablet (90 mg total) by mouth 2 (two) times daily.           Outstanding Labs/Studies   Consider OP f/u labs 6-8 weeks given statin initiation this admission.  Duration of Discharge Encounter   Greater than 30 minutes including physician time.  SignedCrista Luria Dyer, PA 11/08/2019, 11:03 AM   Agree with note by Robbie Lis PA-C  Patient admitted with anterior STEMI and underwent PCI and stenting of his proximal LAD.  He has severe LV dysfunction secondary to ischemic cardiomyopathy with EF of 30%.  He was fitted with a LifeVest yesterday and is aware of its function.  He is otherwise stable ambulating without complaints on appropriate medications.  Plan discharge home today, follow-up with Dr. Marlou Porch.  Lorretta Harp, M.D., [place], [facility], [place] [address] [place], [place]  [address]  [phone] 11/08/2019 12:12 PM

## 2019-11-08 NOTE — Progress Notes (Signed)
Metoprolol has been held for SBP since yesterday.  Current BP 91/68.  Held this AM dose.  Idolina Primer, RN

## 2019-11-12 ENCOUNTER — Encounter: Payer: Managed Care, Other (non HMO) | Admitting: Gastroenterology

## 2019-11-12 NOTE — Telephone Encounter (Signed)
Patient contacted regarding discharge from [facility] on 11/08/2019.  Patient understands to follow up with provider Truitt Merle, NP on 11/20/2019 at 2:15 at [facility] in [place], [place]. Patient understands discharge instructions? Yes Patient understands medications and regiment? Yes Patient understands to bring all medications to this visit? Yes

## 2019-11-14 NOTE — Progress Notes (Signed)
CARDIOLOGY OFFICE NOTE  Date:  11/20/2019    Alfred Clay Date of Birth: 10-08-1950 Medical Record [id number]  PCP:  Shelda Pal, DO  Cardiologist:  [facility]    Chief Complaint  Patient presents with  . Follow-up  . Hospitalization Follow-up    Seen for Dr. Marlou Porch    History of Present Illness: Alfred Clay is a 69 y.o. male who presents today for a post hospital visit/TOC. Seen for Dr. Marlou Porch.   He has a history of diabetes for almost 30 years, HLD, prior esophageal stricture with prior dilatations.   He presented earlier this month with chest pain - EKG with changes concerning for STEMI.  Troponin 2000. He had emergent cath with successful PCI/DES to the mLAD - had moderate LV dysfunction noted. EF is 30 to 35%. He was previously over 300# - has had marked weight loss - unclear to me as to how this happened - ?some reaction to a diabetic medicine he was on. He was discharged with a Life Vest in place.   The patient does not have symptoms concerning for COVID-19 infection (fever, chills, cough, or new shortness of breath).   Comes in today. Here alone. He is very quiet. Says he is doing ok. No chest pain. Breathing is ok. BP still soft - readings from home are reviewed - remain soft. He is not dizzy. He has his Life Vest in place - no problems noted. He is tolerating his medicines without issue. He tells me how he used to weigh over #330 pounds - lost significant amount of weight within just a few months time - apparently was on some type of diabetic medicine that caused him to lose his appetite, couldn't taste, mouth was sore, etc. He has known esophageal issues - was needing a dilatation - this has been placed on hold. He notes his appetite has improved. He has issues about work - not addressed during the admission. He brought a paper about work.    Past Medical History:  Diagnosis Date  . Acquired esophageal ring   . Cancer ([place])    skin- upper back and  lower back  . Cataract    bilateral-removed  . DM type 2 (diabetes mellitus, type 2) ([place])   . GERD (gastroesophageal reflux disease)   . History of esophagitis   . Hyperlipidemia    no meds  . Hypertension    no meds    Past Surgical History:  Procedure Laterality Date  . BIOPSY  08/06/2019   Procedure: BIOPSY;  Surgeon: Lavena Bullion, DO;  Location: [facility];  Service: Gastroenterology;;  . CATARACT EXTRACTION Bilateral 10/2018  . COLONOSCOPY  2018  . CORONARY STENT INTERVENTION N/A 11/05/2019   Procedure: CORONARY STENT INTERVENTION;  Surgeon: Burnell Blanks, MD;  Location: [facility];  Service: Cardiovascular;  Laterality: N/A;  . CORONARY/GRAFT ACUTE MI REVASCULARIZATION N/A 11/05/2019   Procedure: Coronary/Graft Acute MI Revascularization;  Surgeon: Burnell Blanks, MD;  Location: [facility] [facility];  Service: Cardiovascular;  Laterality: N/A;  . ESOPHAGOGASTRODUODENOSCOPY  10/16/2018  . ESOPHAGOGASTRODUODENOSCOPY (EGD) WITH PROPOFOL N/A 08/06/2019   Procedure: ESOPHAGOGASTRODUODENOSCOPY (EGD) WITH PROPOFOL;  Surgeon: Lavena Bullion, DO;  Location: [facility];  Service: Gastroenterology;  Laterality: N/A;  with dilation using ped scope  . HEMORRHOID SURGERY    . LEFT HEART CATH AND CORONARY ANGIOGRAPHY N/A 11/05/2019   Procedure: LEFT HEART CATH AND CORONARY ANGIOGRAPHY;  Surgeon: Burnell Blanks, MD;  Location: [place] [facility];  Service: Cardiovascular;  Laterality: N/A;  . SAVORY DILATION N/A 08/06/2019   Procedure: SAVORY DILATION;  Surgeon: Lavena Bullion, DO;  Location: [facility];  Service: Gastroenterology;  Laterality: N/A;  . SKIN CANCER EXCISION     back for skin cancer  . UPPER GASTROINTESTINAL ENDOSCOPY    . WISDOM TOOTH EXTRACTION       Medications: Current Meds  Medication Sig  . Apoaequorin (PREVAGEN EXTRA STRENGTH) 20 MG CAPS Take 20 mg by mouth daily.  Marland Kitchen aspirin EC 81 MG tablet Take 1 tablet (81 mg  total) by mouth daily.  Marland Kitchen atorvastatin (LIPITOR) 80 MG tablet Take 1 tablet (80 mg total) by mouth daily.  . metFORMIN (GLUCOPHAGE) 500 MG tablet Take 500 mg by mouth 2 (two) times daily with a meal.  . metoprolol succinate (TOPROL XL) 25 MG 24 hr tablet Take 0.5 tablets (12.5 mg total) by mouth daily.  . nitroGLYCERIN (NITROSTAT) 0.4 MG SL tablet Place 1 tablet (0.4 mg total) under the tongue every 5 (five) minutes as needed.  . sucralfate (CARAFATE) 1 GM/10ML suspension Take 10 mLs (1 g total) by mouth 2 (two) times daily.  . ticagrelor (BRILINTA) 90 MG TABS tablet Take 1 tablet (90 mg total) by mouth 2 (two) times daily.     Allergies: Allergies  Allergen Reactions  . Ace Inhibitors Cough    Social History: The patient  reports that he has never smoked. He has never used smokeless tobacco. He reports current alcohol use. He reports previous drug use.   Family History: The patient's family history includes Colon cancer in his mother; Colon polyps in his mother; Diabetes in his mother. Father did have heart disease - had stents. He smoked.   Review of Systems: Please see the history of present illness.   All other systems are reviewed and negative.   Physical Exam: VS:  BP 90/60   Pulse 63   Ht 5' 10.5" (1.791 m)   Wt 164 lb (74.4 kg)   SpO2 98%   BMI 23.20 kg/m  .  BMI Body mass index is 23.2 kg/m.  Wt Readings from Last 3 Encounters:  11/20/19 164 lb (74.4 kg)  11/08/19 172 lb 13.5 oz (78.4 kg)  10/28/19 177 lb (80.3 kg)    General: He is rather quiet - almost tearful at times. He is alert and in no acute distress.   Cardiac: Regular rate and rhythm. Heart tones are distant. No murmurs, rubs, or gallops. No edema.  Respiratory:  Lungs are clear to auscultation bilaterally with normal work of breathing.  MS: No deformity or atrophy. Gait and ROM intact.  Skin: Warm and dry. Color is normal.  Neuro:  Strength and sensation are intact and no gross focal deficits noted.    Psych: Alert, appropriate and with normal affect.   LABORATORY DATA:  EKG:  EKG is not ordered today.  Lab Results  Component Value Date   WBC 8.2 11/06/2019   HGB 12.6 (L) 11/06/2019   HCT 37.5 (L) 11/06/2019   PLT 244 11/06/2019   GLUCOSE 130 (H) 11/08/2019   CHOL 212 (H) 10/16/2019   TRIG 59.0 10/16/2019   HDL 57.20 10/16/2019   LDLCALC 143 (H) 10/16/2019   ALT 9 10/16/2019   AST 13 10/16/2019   NA 137 11/08/2019   K 3.7 11/08/2019   CL 104 11/08/2019   CREATININE 1.04 11/08/2019   BUN 20 11/08/2019   CO2 24 11/08/2019  HGBA1C 6.5 10/16/2019   MICROALBUR <0.7 10/16/2019     BNP (last 3 results) No results for input(s): BNP in the last 8760 hours.  ProBNP (last 3 results) No results for input(s): PROBNP in the last 8760 hours.   Other Studies Reviewed Today:  Cath 11/05/2019  Prox RCA to Mid RCA lesion is 10% stenosed.  1st Mrg lesion is 30% stenosed.  Mid Cx to Dist Cx lesion is 20% stenosed.  Prox LAD to Mid LAD lesion is 99% stenosed.  Mid LAD lesion is 30% stenosed.  Dist LAD lesion is 20% stenosed.  A drug-eluting stent was successfully placed using a SYNERGY XD 3.0X32.  Post intervention, there is a 0% residual stenosis.  The left ventricular systolic function is normal.  LV end diastolic pressure is normal.  The left ventricular ejection fraction is 35-45% by visual estimate.  There is no mitral valve regurgitation.  1. NSTEMI secondary to sub-total occlusion of the mid LAD 2. The LAD is a large caliber vessel that courses to the apex. The mid vessel has a thrombotic sub-total (99%) occlusion with flow into the distal vessel.  3. The Circumflex has mild disease 4. The RCA is a large dominant vessel with mild mid disease 5. Successful PTCA/DES x 1 mid LAD 6. Moderate segmental LV systolic dysfunction with hypokinesis of the anterior wall, apex and inferoapical wall  Recommendation: Will admit to the ICU. Will continue Aggrastat for 2  hours post PCI. Continue ASA and Brilinta for one year. Continue high intensity statin. Will start low dose beta blocker. Echo in am.  Diagnostic Dominance: Right  Intervention      TTE: 11/06/19 IMPRESSIONS    1. Left ventricular ejection fraction, by estimation, is 30 to 35%. The  left ventricle has moderately decreased function. The left ventrical  demonstrates regional wall motion abnormalities. There is apical, mid and  apical anterior, apical inferior,  apical inferolateral, mid anteroseptal and inferoseptal, apical lateral  and apical septal akinesis.Left ventricular diastolic parameters are  consistent with Grade I diastolic dysfunction (impaired relaxation).  2. Right ventricular systolic function is normal. The right ventricular  size is normal.  3. The mitral valve is normal in structure and function. no evidence of  mitral valve regurgitation. No evidence of mitral stenosis.  4. The aortic valve is tricuspid. Aortic valve regurgitation is trivial .  Mild to moderate aortic valve sclerosis/calcification is present, without  any evidence of aortic stenosis.  5. The inferior vena cava is dilated in size with <50% respiratory  variability, suggesting right atrial pressure of 15 mmHg.   Assessment/Plan:  1. CAD with recent anterior STEMI with PCI to mLAD with DES - numerous wall motion defects on echo - EF of 30 to 35% - no further chest pain - on low dose beta blocker and high intensity statin. Needs lab today.   2. Ischemic CM - EF of 30 to 35% - has Life Vest in place - unclear what interaction this may have with work - he is around lots of high voltage - he is going to check with the information #. His job requires him to be able to lift 70# - that would be prohibitive at this time - he works at [employer] in [employer]. At this time, he is not to return to work. May in fact need to wait the 3 months with follow up echo. Unclear what role his life vest will play as  well with return to work. At this time,  he is not on guideline therapy given his soft BP. Tenuous disposition.   3. HTN - BP rather soft - not a candidate to titrate his medicine. Would hold on ARB therapy.   4. HLD - on high intensity statin. Will need follow up labs  5. Long standing DM  6. Prior morbid obesity - somewhat worrisome story he tells me that led to weight loss.   7. Esophageal stricture - he is placing his plan for EGD on hold.   8. COVID-19 Education: The signs and symptoms of COVID-19 were discussed with the patient and how to seek care for testing (follow up with PCP or arrange E-visit).  The importance of social distancing, staying at home, hand hygiene and wearing a mask when out in public were discussed today.  Current medicines are reviewed with the patient today.  The patient does not have concerns regarding medicines other than what has been noted above.  The following changes have been made:  See above.  Labs/ tests ordered today include:    Orders Placed This Encounter  Procedures  . Basic metabolic panel  . CBC     Disposition:   FU with [place] in about 2 weeks.   Patient is agreeable to this plan and will call if any problems develop in the interim.   SignedTruitt Merle, NP  11/20/2019 3:23 PM  [place] [address] [place], [place]  [address] Phone: [phone] Fax: [phone]

## 2019-11-20 ENCOUNTER — Encounter: Payer: Self-pay | Admitting: Nurse Practitioner

## 2019-11-20 ENCOUNTER — Ambulatory Visit (INDEPENDENT_AMBULATORY_CARE_PROVIDER_SITE_OTHER): Payer: Managed Care, Other (non HMO) | Admitting: Nurse Practitioner

## 2019-11-20 ENCOUNTER — Other Ambulatory Visit: Payer: Self-pay

## 2019-11-20 VITALS — BP 90/60 | HR 63 | Ht 70.5 in | Wt 164.0 lb

## 2019-11-20 DIAGNOSIS — I255 Ischemic cardiomyopathy: Secondary | ICD-10-CM | POA: Diagnosis not present

## 2019-11-20 DIAGNOSIS — Z955 Presence of coronary angioplasty implant and graft: Secondary | ICD-10-CM | POA: Diagnosis not present

## 2019-11-20 DIAGNOSIS — Z7189 Other specified counseling: Secondary | ICD-10-CM

## 2019-11-20 DIAGNOSIS — I5022 Chronic systolic (congestive) heart failure: Secondary | ICD-10-CM

## 2019-11-20 DIAGNOSIS — I259 Chronic ischemic heart disease, unspecified: Secondary | ICD-10-CM

## 2019-11-20 DIAGNOSIS — Z95810 Presence of automatic (implantable) cardiac defibrillator: Secondary | ICD-10-CM

## 2019-11-20 NOTE — Addendum Note (Signed)
Addended by: Burtis Junes on: 11/20/2019 06:30 PM   Modules accepted: Level of Service

## 2019-11-20 NOTE — Patient Instructions (Addendum)
After Visit Summary:  We will be checking the following labs today - BMET & CBC   Medication Instructions:    Continue with your current medicines.    If you need a refill on your cardiac medications before your next appointment, please call your pharmacy.     Testing/Procedures To Be Arranged:  N/A  Follow-Up:   See Dr. Marlou Porch in about 2 weeks - hoping to try and add more medicine when you come back to help with the pumping function of your heart.     At [facility], you and your health needs are our priority.  As part of our continuing mission to provide you with exceptional heart care, we have created designated Provider Care Teams.  These Care Teams include your primary Cardiologist (physician) and Advanced Practice Providers (APPs -  Physician Assistants and Nurse Practitioners) who all work together to provide you with the care you need, when you need it.  Special Instructions:  . Stay safe, stay home, wash your hands for at least 20 seconds and wear a mask when out in public.  . It was good to talk with you today.  Marland Kitchen Keep a check on your BP for me.  . For now, we will be out of work.  . Call the 1-800 # about the Life Vest and your work environment    Call the [place] office at [phone] if you have any questions, problems or concerns.

## 2019-11-21 LAB — CBC
Hematocrit: 37.6 % (ref 37.5–51.0)
Hemoglobin: 12.9 g/dL — ABNORMAL LOW (ref 13.0–17.7)
MCH: 30.6 pg (ref 26.6–33.0)
MCHC: 34.3 g/dL (ref 31.5–35.7)
MCV: 89 fL (ref 79–97)
Platelets: 351 10*3/uL (ref 150–450)
RBC: 4.21 x10E6/uL (ref 4.14–5.80)
RDW: 12.7 % (ref 11.6–15.4)
WBC: 7.2 10*3/uL (ref 3.4–10.8)

## 2019-11-21 LAB — BASIC METABOLIC PANEL
BUN/Creatinine Ratio: 17 (ref 10–24)
BUN: 17 mg/dL (ref 8–27)
CO2: 22 mmol/L (ref 20–29)
Calcium: 9.6 mg/dL (ref 8.6–10.2)
Chloride: 103 mmol/L (ref 96–106)
Creatinine, Ser: 0.99 mg/dL (ref 0.76–1.27)
GFR calc Af Amer: 90 mL/min/{1.73_m2} (ref >59)
GFR calc non Af Amer: 78 mL/min/{1.73_m2} (ref >59)
Glucose: 108 mg/dL — ABNORMAL HIGH (ref 65–99)
Potassium: 4.2 mmol/L (ref 3.5–5.2)
Sodium: 140 mmol/L (ref 134–144)

## 2019-11-26 ENCOUNTER — Encounter: Payer: Self-pay | Admitting: Cardiology

## 2019-11-26 ENCOUNTER — Ambulatory Visit (INDEPENDENT_AMBULATORY_CARE_PROVIDER_SITE_OTHER): Payer: Managed Care, Other (non HMO) | Admitting: Cardiology

## 2019-11-26 ENCOUNTER — Other Ambulatory Visit: Payer: Self-pay

## 2019-11-26 VITALS — BP 100/60 | Ht 70.5 in | Wt 163.0 lb

## 2019-11-26 DIAGNOSIS — Z95810 Presence of automatic (implantable) cardiac defibrillator: Secondary | ICD-10-CM | POA: Diagnosis not present

## 2019-11-26 DIAGNOSIS — I5022 Chronic systolic (congestive) heart failure: Secondary | ICD-10-CM

## 2019-11-26 DIAGNOSIS — I255 Ischemic cardiomyopathy: Secondary | ICD-10-CM

## 2019-11-26 DIAGNOSIS — Z955 Presence of coronary angioplasty implant and graft: Secondary | ICD-10-CM

## 2019-11-26 NOTE — Patient Instructions (Signed)
Medication Instructions:  The current medical regimen is effective;  continue present plan and medications.  *If you need a refill on your cardiac medications before your next appointment, please call your pharmacy*  Testing/Procedures: Your physician has requested that you have an echocardiogram (the week of March 29). Echocardiography is a painless test that uses sound waves to create images of your heart. It provides your doctor with information about the size and shape of your heart and how well your heart's chambers and valves are working. This procedure takes approximately one hour. There are no restrictions for this procedure.  Follow-Up: At [facility], you and your health needs are our priority.  As part of our continuing mission to provide you with exceptional heart care, we have created designated Provider Care Teams.  These Care Teams include your primary Cardiologist (physician) and Advanced Practice Providers (APPs -  Physician Assistants and Nurse Practitioners) who all work together to provide you with the care you need, when you need it.  We recommend signing up for the patient portal called "MyChart".  Sign up information is provided on this After Visit Summary.  MyChart is used to connect with patients for Virtual Visits (Telemedicine).  Patients are able to view lab/test results, encounter notes, upcoming appointments, etc.  Non-urgent messages can be sent to your provider as well.   To learn more about what you can do with MyChart, go to [email or web address].    Your next appointment:   5-6 week(s)  The format for your next appointment:   In Person  Provider:   Candee Furbish, MD   Thank you for choosing [facility]!!

## 2019-11-26 NOTE — Progress Notes (Signed)
Cardiology Office Note:    Date:  11/26/2019   ID:  [name], DOB 09/07/1951, MRN [id number]  PCP:  Shelda Pal, DO  Cardiologist:  Candee Furbish, MD  Electrophysiologist:  None   Referring MD: Shelda Pal*     History of Present Illness:    Alfred Clay is a 69 y.o. male here for coronary artery disease follow-up.  In February 2021 had successful PCI to DES to the mid LAD with EF of 30 to 35%.  LifeVest.  Very quiet.  Used to weigh over 330 pounds.  Past Medical History:  Diagnosis Date  . Acquired esophageal ring   . Cancer ([place])    skin- upper back and lower back  . Cataract    bilateral-removed  . DM type 2 (diabetes mellitus, type 2) ([place])   . GERD (gastroesophageal reflux disease)   . History of esophagitis   . Hyperlipidemia    no meds  . Hypertension    no meds    Past Surgical History:  Procedure Laterality Date  . BIOPSY  08/06/2019   Procedure: BIOPSY;  Surgeon: Lavena Bullion, DO;  Location: [facility];  Service: Gastroenterology;;  . CATARACT EXTRACTION Bilateral 10/2018  . COLONOSCOPY  2018  . CORONARY STENT INTERVENTION N/A 11/05/2019   Procedure: CORONARY STENT INTERVENTION;  Surgeon: Burnell Blanks, MD;  Location: [place] [facility];  Service: Cardiovascular;  Laterality: N/A;  . CORONARY/GRAFT ACUTE MI REVASCULARIZATION N/A 11/05/2019   Procedure: Coronary/Graft Acute MI Revascularization;  Surgeon: Burnell Blanks, MD;  Location: [place] [facility];  Service: Cardiovascular;  Laterality: N/A;  . ESOPHAGOGASTRODUODENOSCOPY  10/16/2018  . ESOPHAGOGASTRODUODENOSCOPY (EGD) WITH PROPOFOL N/A 08/06/2019   Procedure: ESOPHAGOGASTRODUODENOSCOPY (EGD) WITH PROPOFOL;  Surgeon: Lavena Bullion, DO;  Location: [facility];  Service: Gastroenterology;  Laterality: N/A;  with dilation using ped scope  . HEMORRHOID SURGERY    . LEFT HEART CATH AND CORONARY ANGIOGRAPHY N/A 11/05/2019   Procedure: LEFT HEART  CATH AND CORONARY ANGIOGRAPHY;  Surgeon: Burnell Blanks, MD;  Location: [place] [facility];  Service: Cardiovascular;  Laterality: N/A;  . SAVORY DILATION N/A 08/06/2019   Procedure: SAVORY DILATION;  Surgeon: Lavena Bullion, DO;  Location: [facility];  Service: Gastroenterology;  Laterality: N/A;  . SKIN CANCER EXCISION     back for skin cancer  . UPPER GASTROINTESTINAL ENDOSCOPY    . WISDOM TOOTH EXTRACTION      Current Medications: Current Meds  Medication Sig  . Apoaequorin (PREVAGEN EXTRA STRENGTH) 20 MG CAPS Take 20 mg by mouth daily.  Marland Kitchen aspirin EC 81 MG tablet Take 1 tablet (81 mg total) by mouth daily.  Marland Kitchen atorvastatin (LIPITOR) 80 MG tablet Take 1 tablet (80 mg total) by mouth daily.  . metFORMIN (GLUCOPHAGE) 500 MG tablet Take 500 mg by mouth 2 (two) times daily with a meal.  . metoprolol succinate (TOPROL XL) 25 MG 24 hr tablet Take 0.5 tablets (12.5 mg total) by mouth daily.  . nitroGLYCERIN (NITROSTAT) 0.4 MG SL tablet Place 1 tablet (0.4 mg total) under the tongue every 5 (five) minutes as needed.  . pantoprazole (PROTONIX) 40 MG tablet Take 1 tablet (40 mg total) by mouth 2 (two) times daily.  . sucralfate (CARAFATE) 1 GM/10ML suspension Take 10 mLs (1 g total) by mouth 2 (two) times daily.  . ticagrelor (BRILINTA) 90 MG TABS tablet Take 1 tablet (90 mg total) by mouth 2 (two) times daily.  Allergies:   Ace inhibitors   Social History   Socioeconomic History  . Marital status: Married    Spouse name: Not on file  . Number of children: Not on file  . Years of education: Not on file  . Highest education level: Not on file  Occupational History  . Not on file  Tobacco Use  . Smoking status: Never Smoker  . Smokeless tobacco: Never Used  Substance and Sexual Activity  . Alcohol use: Yes    Comment: ocassionally liquor  . Drug use: Not Currently  . Sexual activity: Not on file  Other Topics Concern  . Not on file  Social History Narrative  .  Not on file   Social Determinants of Health   Financial Resource Strain:   . Difficulty of Paying Living Expenses: Not on file  Food Insecurity:   . Worried About Charity fundraiser in the Last Year: Not on file  . Ran Out of Food in the Last Year: Not on file  Transportation Needs:   . Lack of Transportation (Medical): Not on file  . Lack of Transportation (Non-Medical): Not on file  Physical Activity:   . Days of Exercise per Week: Not on file  . Minutes of Exercise per Session: Not on file  Stress:   . Feeling of Stress : Not on file  Social Connections:   . Frequency of Communication with Friends and Family: Not on file  . Frequency of Social Gatherings with Friends and Family: Not on file  . Attends Religious Services: Not on file  . Active Member of Clubs or Organizations: Not on file  . Attends Archivist Meetings: Not on file  . Marital Status: Not on file     Family History: The patient's family history includes Colon cancer in his mother; Colon polyps in his mother; Diabetes in his mother. There is no history of Esophageal cancer, Rectal cancer, or Stomach cancer.  ROS:   Please see the history of present illness.     All other systems reviewed and are negative.  EKGs/Labs/Other Studies Reviewed:    The following studies were reviewed today: Cardiac catheterization February 2021: 1. NSTEMI secondary to sub-total occlusion of the mid LAD 2. The LAD is a large caliber vessel that courses to the apex. The mid vessel has a thrombotic sub-total (99%) occlusion with flow into the distal vessel.  3. The Circumflex has mild disease 4. The RCA is a large dominant vessel with mild mid disease 5. Successful PTCA/DES x 1 mid LAD 6. Moderate segmental LV systolic dysfunction with hypokinesis of the anterior wall, apex and inferoapical wall  EKG:  EKG is not ordered today.    Recent Labs: 10/16/2019: ALT 9 11/20/2019: BUN 17; Creatinine, Ser 0.99; Hemoglobin 12.9;  Platelets 351; Potassium 4.2; Sodium 140  Recent Lipid Panel    Component Value Date/Time   CHOL 212 (H) 10/16/2019 0828   TRIG 59.0 10/16/2019 0828   HDL 57.20 10/16/2019 0828   CHOLHDL 4 10/16/2019 0828   VLDL 11.8 10/16/2019 0828   LDLCALC 143 (H) 10/16/2019 0828    Physical Exam:    VS:  BP 100/60   Ht 5' 10.5" (1.791 m)   Wt 163 lb (73.9 kg)   BMI 23.06 kg/m     Wt Readings from Last 3 Encounters:  11/26/19 163 lb (73.9 kg)  11/20/19 164 lb (74.4 kg)  11/08/19 172 lb 13.5 oz (78.4 kg)     GEN:  Well nourished, well developed in no acute distress HEENT: Normal NECK: No JVD; No carotid bruits LYMPHATICS: No lymphadenopathy CARDIAC: RRR, no murmurs, rubs, gallops wearing LifeVest RESPIRATORY:  Clear to auscultation without rales, wheezing or rhonchi  ABDOMEN: Soft, non-tender, non-distended MUSCULOSKELETAL:  No edema; No deformity  SKIN: Warm and dry NEUROLOGIC:  Alert and oriented x 3 PSYCHIATRIC:  Normal affect   ASSESSMENT:    1. Ischemic cardiomyopathy   2. S/P coronary artery stent placement   3. Chronic systolic heart failure ([place])   4. Uses LifeVest defibrillator    PLAN:    In order of problems listed above:  Coronary artery disease anterior STEMI -Mid LAD DES.  EF 35%.  We will repeat echocardiogram in 40-45 days from 11/05/2019.  If his ejection fraction has improved, he can discontinue his LifeVest.  Does not appear to be any nonsustained VT on hospitalization. -Only able to tolerate low-dose Toprol currently.  No ARB.  Hypotensive. -Currently feels well NYHA class I.  Walking the treadmill.   Ischemic cardiomyopathy -LifeVest.  Works around high voltage.  Works at [employer].  Hopefully repeat echo will be improved and we can discontinue the LifeVest.  Essential hypertension -Blood pressure is soft.  May not be able to use angiotensin receptor blocker therapy.  Hyperlipidemia -Continue with high intensity statin.  Follow  labs.  Longstanding diabetes  Morbid obesity previously.  Esophageal stricture -EGD is currently on hold.  I also asked him to delay his dental office visit for the next 3 to 4 months.  I will see him back after his echocardiogram.  Medication Adjustments/Labs and Tests Ordered: Current medicines are reviewed at length with the patient today.  Concerns regarding medicines are outlined above.  Orders Placed This Encounter  Procedures  . ECHOCARDIOGRAM COMPLETE   No orders of the defined types were placed in this encounter.   Patient Instructions  Medication Instructions:  The current medical regimen is effective;  continue present plan and medications.  *If you need a refill on your cardiac medications before your next appointment, please call your pharmacy*  Testing/Procedures: Your physician has requested that you have an echocardiogram (the week of March 29). Echocardiography is a painless test that uses sound waves to create images of your heart. It provides your doctor with information about the size and shape of your heart and how well your heart's chambers and valves are working. This procedure takes approximately one hour. There are no restrictions for this procedure.  Follow-Up: At [facility], you and your health needs are our priority.  As part of our continuing mission to provide you with exceptional heart care, we have created designated Provider Care Teams.  These Care Teams include your primary Cardiologist (physician) and Advanced Practice Providers (APPs -  Physician Assistants and Nurse Practitioners) who all work together to provide you with the care you need, when you need it.  We recommend signing up for the patient portal called "MyChart".  Sign up information is provided on this After Visit Summary.  MyChart is used to connect with patients for Virtual Visits (Telemedicine).  Patients are able to view lab/test results, encounter notes, upcoming appointments,  etc.  Non-urgent messages can be sent to your provider as well.   To learn more about what you can do with MyChart, go to [email or web address].    Your next appointment:   5-6 week(s)  The format for your next appointment:   In Person  Provider:   Candee Furbish,  MD   Thank you for choosing [facility]!!        Signed, Candee Furbish, MD  11/26/2019 3:36 PM    [place]

## 2019-11-27 ENCOUNTER — Encounter: Payer: Self-pay | Admitting: Cardiology

## 2019-11-27 ENCOUNTER — Telehealth: Payer: Self-pay | Admitting: Cardiology

## 2019-11-27 NOTE — Telephone Encounter (Signed)
FMLA/disability form received from [employer]. Placed in box for Dr. Marlou Porch to review. 11/27/19 vlm

## 2019-11-27 NOTE — Telephone Encounter (Signed)
error 

## 2019-11-28 ENCOUNTER — Telehealth: Payer: Self-pay | Admitting: Cardiology

## 2019-11-28 NOTE — Telephone Encounter (Signed)
Signed FMLA/disability form returned to [employer] at [place]. 11/28/19 vlm

## 2019-11-28 NOTE — Telephone Encounter (Signed)
Paperwork received, completed, signed by MD and returned to MR.

## 2019-12-02 ENCOUNTER — Other Ambulatory Visit (INDEPENDENT_AMBULATORY_CARE_PROVIDER_SITE_OTHER): Payer: Managed Care, Other (non HMO)

## 2019-12-02 ENCOUNTER — Other Ambulatory Visit: Payer: Self-pay

## 2019-12-02 DIAGNOSIS — E785 Hyperlipidemia, unspecified: Secondary | ICD-10-CM

## 2019-12-02 LAB — LIPID PANEL
Cholesterol: 137 mg/dL (ref 0–200)
HDL: 50.2 mg/dL (ref >39.00)
LDL Cholesterol: 74 mg/dL (ref 0–99)
NonHDL: 87.12
Total CHOL/HDL Ratio: 3
Triglycerides: 64 mg/dL (ref 0.0–149.0)
VLDL: 12.8 mg/dL (ref 0.0–40.0)

## 2019-12-13 ENCOUNTER — Ambulatory Visit: Payer: Managed Care, Other (non HMO) | Attending: Internal Medicine

## 2019-12-13 DIAGNOSIS — Z23 Encounter for immunization: Secondary | ICD-10-CM

## 2019-12-13 NOTE — Progress Notes (Signed)
   Covid-19 Vaccination Clinic  Name:  [name]    MRN: [id number] DOB: [birth year]  12/13/2019  [name] was observed post Covid-19 immunization for 15 minutes without incident. He was provided with Vaccine Information Sheet and instruction to access the V-Safe system.   [name] was instructed to call 911 with any severe reactions post vaccine: Marland Kitchen Difficulty breathing  . Swelling of face and throat  . A fast heartbeat  . A bad rash all over body  . Dizziness and weakness   Immunizations Administered    Name Date Dose VIS Date Route   Moderna COVID-19 Vaccine 12/13/2019  9:03 AM 0.5 mL 08/27/2019 Intramuscular   Manufacturer: Moderna   Lot: [id number]   [id number]

## 2019-12-19 ENCOUNTER — Encounter (HOSPITAL_COMMUNITY)
Admission: RE | Admit: 2019-12-19 | Discharge: 2019-12-19 | Disposition: A | Discharge status: Home / Self Care | Payer: Managed Care, Other (non HMO) | Source: Ambulatory Visit | Attending: Cardiology | Admitting: Cardiology

## 2019-12-19 ENCOUNTER — Other Ambulatory Visit: Payer: Self-pay

## 2019-12-19 VITALS — BP 102/70 | HR 73 | Temp 95.9°F | Ht 70.5 in | Wt 161.7 lb

## 2019-12-19 DIAGNOSIS — Z955 Presence of coronary angioplasty implant and graft: Secondary | ICD-10-CM | POA: Diagnosis present

## 2019-12-19 DIAGNOSIS — I214 Non-ST elevation (NSTEMI) myocardial infarction: Secondary | ICD-10-CM | POA: Insufficient documentation

## 2019-12-19 NOTE — Progress Notes (Signed)
Daily Session Note  Patient Details  Name: Alfred Clay MRN: [id number] Date of Birth: [birth year] Referring Provider:     CARDIAC REHAB PHASE II ORIENTATION from 12/19/2019 in [place]  Referring Provider  Dr. Marlou Porch      Encounter Date: 12/19/2019  Check In: Session Check In - 12/19/19 1117      Check-In   Supervising physician immediately available to respond to emergencies  See telemetry face sheet for immediately available MD    Location  [facility]    Staff Present  Russella Dar, MS, EP, [facility], Exercise Physiologist;Debra Wynetta Emery, RN, BSN    Virtual Visit  No    Medication changes reported      No    Fall or balance concerns reported     No    Tobacco Cessation  No Change    Warm-up and Cool-down  Performed as group-led instruction    Resistance Training Performed  Yes    VAD Patient?  No    PAD/SET Patient?  No      Pain Assessment   Currently in Pain?  No/denies    Pain Score  0-No pain    Multiple Pain Sites  No       Capillary Blood Glucose: No results found for this or any previous visit (from the past 24 hour(s)).    Social History   Tobacco Use  Smoking Status Never Smoker  Smokeless Tobacco Never Used    Goals Met:  Independence with exercise equipment Exercise tolerated well Personal goals reviewed No report of cardiac concerns or symptoms Strength training completed today  Goals Unmet:  Not Applicable  Comments: Check out: 10:15   Dr. Kate Sable is Medical Director for [place] and Pulmonary Rehab.

## 2019-12-19 NOTE — Progress Notes (Signed)
Cardiac/Pulmonary Rehab Medication Review by a Pharmacist  Does the patient  feel that his/her medications are working for him/her?  yes  Has the patient been experiencing any side effects to the medications prescribed?  no  Does the patient measure his/her own blood pressure or blood glucose at home?  yes - every morning  Does the patient have any problems obtaining medications due to transportation or finances?   no  Understanding of regimen: excellent Understanding of indications: excellent Potential of compliance: excellent  Questions asked to Determine Patient Understanding of Medication Regimen:  1. What is the name of the medication?  2. What is the medication used for?  3. When should it be taken?  4. How much should be taken?  5. How will you take it?  6. What side effects should you report?  Understanding Defined as: Excellent: All questions above are correct Good: Questions 1-4 are correct Fair: Questions 1-2 are correct  Poor: 1 or none of the above questions are correct   Pharmacist comments: Overall, patient has great understanding of medication regimen and high compliance.  States no issues with medications and working well.  No further questions.   Margot Ables, PharmD Clinical Pharmacist 12/19/2019 8:45 AM

## 2019-12-19 NOTE — Progress Notes (Signed)
Cardiac Individual Treatment Plan  Patient Details  Name: Alfred Clay MRN: [id number] Date of Birth: 23-Nov-1950 Referring Provider:     CARDIAC REHAB PHASE II ORIENTATION from 12/19/2019 in [place]  Referring Provider  Dr. Marlou Porch      Initial Encounter Date:    CARDIAC REHAB PHASE II ORIENTATION from 12/19/2019 in [place]  Date  12/19/19      Visit Diagnosis: NSTEMI (non-ST elevated myocardial infarction) [facility])  Status post coronary artery stent placement  Patient's Home Medications on Admission:  Current Outpatient Medications:  .  Apoaequorin (PREVAGEN EXTRA STRENGTH) 20 MG CAPS, Take 20 mg by mouth daily., Disp: , Rfl:  .  aspirin EC 81 MG tablet, Take 1 tablet (81 mg total) by mouth daily., Disp: 90 tablet, Rfl: 3 .  atorvastatin (LIPITOR) 80 MG tablet, Take 1 tablet (80 mg total) by mouth daily., Disp: 30 tablet, Rfl: 6 .  metFORMIN (GLUCOPHAGE) 500 MG tablet, Take 500 mg by mouth 2 (two) times daily with a meal., Disp: , Rfl:  .  metoprolol succinate (TOPROL XL) 25 MG 24 hr tablet, Take 0.5 tablets (12.5 mg total) by mouth daily., Disp: 30 tablet, Rfl: 6 .  nitroGLYCERIN (NITROSTAT) 0.4 MG SL tablet, Place 1 tablet (0.4 mg total) under the tongue every 5 (five) minutes as needed., Disp: 25 tablet, Rfl: 12 .  pantoprazole (PROTONIX) 40 MG tablet, Take 1 tablet (40 mg total) by mouth 2 (two) times daily., Disp: 180 tablet, Rfl: 5 .  sucralfate (CARAFATE) 1 GM/10ML suspension, Take 10 mLs (1 g total) by mouth 2 (two) times daily., Disp: 420 mL, Rfl: 1 .  ticagrelor (BRILINTA) 90 MG TABS tablet, Take 1 tablet (90 mg total) by mouth 2 (two) times daily., Disp: 60 tablet, Rfl: 11  Past Medical History: Past Medical History:  Diagnosis Date  . Acquired esophageal ring   . Cancer ([place])    skin- upper back and lower back  . Cataract    bilateral-removed  . DM type 2 (diabetes mellitus, type 2) ([place])   . GERD  (gastroesophageal reflux disease)   . History of esophagitis   . Hyperlipidemia    no meds  . Hypertension    no meds    Tobacco Use: Social History   Tobacco Use  Smoking Status Never Smoker  Smokeless Tobacco Never Used    Labs: Recent Review Scientist, physiological    Labs for ITP Cardiac and Pulmonary Rehab Latest Ref Rng & Units 10/16/2019 12/02/2019   Cholestrol 0 - 200 mg/dL 212(H) 137   LDLCALC 0 - 99 mg/dL 143(H) 74   HDL >39.00 mg/dL 57.20 50.20   Trlycerides 0.0 - 149.0 mg/dL 59.0 64.0   Hemoglobin A1c 4.6 - 6.5 % 6.5 -      Capillary Blood Glucose: Lab Results  Component Value Date   GLUCAP 112 (H) 11/08/2019   GLUCAP 100 (H) 08/06/2019     Exercise Target Goals: Exercise Program Goal: Individual exercise prescription set using results from initial 6 min walk test and THRR while considering  patient's activity barriers and safety.   Exercise Prescription Goal: Starting with aerobic activity 30 plus minutes a day, 3 days per week for initial exercise prescription. Provide home exercise prescription and guidelines that participant acknowledges understanding prior to discharge.  Activity Barriers & Risk Stratification: Activity Barriers & Cardiac Risk Stratification - 12/19/19 1136      Activity Barriers & Cardiac Risk Stratification   Activity Barriers  --   (  L) knee is weak   Cardiac Risk Stratification  High       6 Minute Walk: 6 Minute Walk    Row Name 12/19/19 1132         6 Minute Walk   Phase  Initial     Distance  1300 feet     Walk Time  6 minutes     # of Rest Breaks  0     MPH  2.46     METS  2.88     RPE  11     Perceived Dyspnea   11     VO2 Peak  12.55     Symptoms  No     Resting HR  73 bpm     Resting BP  102/70     Resting Oxygen Saturation   100 %     Exercise Oxygen Saturation  during 6 min walk  93 %     Max Ex. HR  115 bpm     Max Ex. BP  140/82     2 Minute Post BP  110/70        Oxygen Initial Assessment:   Oxygen  Re-Evaluation:   Oxygen Discharge (Final Oxygen Re-Evaluation):   Initial Exercise Prescription: Initial Exercise Prescription - 12/19/19 1100      Date of Initial Exercise RX and Referring Provider   Date  12/19/19    Referring Provider  Dr. Marlou Porch    Expected Discharge Date  03/20/20      Treadmill   MPH  2    Grade  0    Minutes  17    METs  2.53      Prescription Details   Frequency (times per week)  3    Duration  Progress to 30 minutes of continuous aerobic without signs/symptoms of physical distress      Intensity   THRR 40-80% of Max Heartrate  104-120-136    Ratings of Perceived Exertion  11-15    Perceived Dyspnea  0-4      Progression   Progression  Continue to follow PAD protocol      Resistance Training   Training Prescription  Yes    Weight  1    Reps  10-15       Perform Capillary Blood Glucose checks as needed.  Exercise Prescription Changes:   Exercise Comments:  Exercise Comments    Row Name 12/19/19 1144           Exercise Comments  Patient is eager to get started. This was his assessment/orientation visit. We will progress as tolerated.          Exercise Goals and Review:  Exercise Goals    Row Name 12/19/19 1142             Exercise Goals   Increase Physical Activity  Yes       Intervention  Provide advice, education, support and counseling about physical activity/exercise needs.;Develop an individualized exercise prescription for aerobic and resistive training based on initial evaluation findings, risk stratification, comorbidities and participant's personal goals.       Expected Outcomes  Short Term: Attend rehab on a regular basis to increase amount of physical activity.;Long Term: Add in home exercise to make exercise part of routine and to increase amount of physical activity.       Increase Strength and Stamina  Yes       Intervention  Provide advice, education, support and counseling about  physical activity/exercise  needs.;Develop an individualized exercise prescription for aerobic and resistive training based on initial evaluation findings, risk stratification, comorbidities and participant's personal goals.       Expected Outcomes  Short Term: Perform resistance training exercises routinely during rehab and add in resistance training at home;Long Term: Improve cardiorespiratory fitness, muscular endurance and strength as measured by increased METs and functional capacity (6MWT)       Able to understand and use rate of perceived exertion (RPE) scale  Yes       Intervention  Provide education and explanation on how to use RPE scale       Expected Outcomes  Short Term: Able to use RPE daily in rehab to express subjective intensity level;Long Term:  Able to use RPE to guide intensity level when exercising independently       Knowledge and understanding of Target Heart Rate Range (THRR)  Yes       Intervention  Provide education and explanation of THRR including how the numbers were predicted and where they are located for reference       Expected Outcomes  Short Term: Able to use daily as guideline for intensity in rehab;Long Term: Able to use THRR to govern intensity when exercising independently       Able to check pulse independently  Yes       Intervention  Provide education and demonstration on how to check pulse in carotid and radial arteries.;Review the importance of being able to check your own pulse for safety during independent exercise       Expected Outcomes  Short Term: Able to explain why pulse checking is important during independent exercise;Long Term: Able to check pulse independently and accurately       Understanding of Exercise Prescription  Yes       Intervention  Provide education, explanation, and written materials on patient's individual exercise prescription       Expected Outcomes  Short Term: Able to explain program exercise prescription;Long Term: Able to explain home exercise prescription  to exercise independently          Exercise Goals Re-Evaluation :    Discharge Exercise Prescription (Final Exercise Prescription Changes):   Nutrition:  Target Goals: Understanding of nutrition guidelines, daily intake of sodium 1500mg , cholesterol 200mg , calories 30% from fat and 7% or less from saturated fats, daily to have 5 or more servings of fruits and vegetables.  Biometrics: Pre Biometrics - 12/19/19 1145      Pre Biometrics   Height  5' 10.5" (1.791 m)    Weight  161 lb 11.2 oz (73.3 kg)    Waist Circumference  31 inches    Hip Circumference  36 inches    Waist to Hip Ratio  0.86 %    BMI (Calculated)  22.87    Triceps Skinfold  3 mm    % Body Fat  29 %    Grip Strength  35.6 kg    Flexibility  9.8 in    Single Leg Stand  60 seconds        Nutrition Therapy Plan and Nutrition Goals: Nutrition Therapy & Goals - 12/19/19 1152      Nutrition Therapy   RD appointment deferred  Yes      Personal Nutrition Goals   Personal Goal #2  Patient is eating a heart health diet. Hand out given on how to make heart healthy choices    Additional Goals?  No  Intervention Plan   Intervention  Nutrition handout(s) given to patient.       Nutrition Assessments: Nutrition Assessments - 12/19/19 1153      MEDFICTS Scores   Pre Score  9   Patient has not gotten his appetite back fully so he is eating very little.      Nutrition Goals Re-Evaluation:   Nutrition Goals Discharge (Final Nutrition Goals Re-Evaluation):   Psychosocial: Target Goals: Acknowledge presence or absence of significant depression and/or stress, maximize coping skills, provide positive support system. Participant is able to verbalize types and ability to use techniques and skills needed for reducing stress and depression.  Initial Review & Psychosocial Screening: Initial Psych Review & Screening - 12/19/19 1149      Initial Review   Current issues with  None Identified      Family  Dynamics   Good Support System?  Yes      Barriers   Psychosocial barriers to participate in program  There are no identifiable barriers or psychosocial needs.      Screening Interventions   Interventions  Encouraged to exercise    Expected Outcomes  Short Term goal: Identification and review with participant of any Quality of Life or Depression concerns found by scoring the questionnaire.;Long Term goal: The participant improves quality of Life and PHQ9 Scores as seen by post scores and/or verbalization of changes       Quality of Life Scores: Quality of Life - 12/19/19 1004      Quality of Life   Select  Quality of Life      Quality of Life Scores   Health/Function Pre  25.9 %    Socioeconomic Pre  29.29 %    Psych/Spiritual Pre  28.14 %    Family Pre  28.8 %    GLOBAL Pre  27.49 %      Scores of 19 and below usually indicate a poorer quality of life in these areas.  A difference of  2-3 points is a clinically meaningful difference.  A difference of 2-3 points in the total score of the Quality of Life Index has been associated with significant improvement in overall quality of life, self-image, physical symptoms, and general health in studies assessing change in quality of life.  PHQ-9: Recent Review Flowsheet Data    Depression screen [facility] 2/9 12/19/2019   Decreased Interest 0   Down, Depressed, Hopeless 0   PHQ - 2 Score 0   Altered sleeping 0   Tired, decreased energy 0   Change in appetite 1   Feeling bad or failure about yourself  0   Trouble concentrating 0   Moving slowly or fidgety/restless 0   Suicidal thoughts 0   PHQ-9 Score 1   Difficult doing work/chores Not difficult at all     Interpretation of Total Score  Total Score Depression Severity:  1-4 = Minimal depression, 5-9 = Mild depression, 10-14 = Moderate depression, 15-19 = Moderately severe depression, 20-27 = Severe depression   Psychosocial Evaluation and Intervention: Psychosocial Evaluation -  12/19/19 1151      Psychosocial Evaluation & Interventions   Interventions  Encouraged to exercise with the program and follow exercise prescription    Continue Psychosocial Services   No Follow up required       Psychosocial Re-Evaluation:   Psychosocial Discharge (Final Psychosocial Re-Evaluation):   Vocational Rehabilitation: Provide vocational rehab assistance to qualifying candidates.   Vocational Rehab Evaluation & Intervention: Vocational Rehab - 12/19/19 1156  Initial Vocational Rehab Evaluation & Intervention   Assessment shows need for Vocational Rehabilitation  No       Education: Education Goals: Education classes will be provided on a weekly basis, covering required topics. Participant will state understanding/return demonstration of topics presented.  Learning Barriers/Preferences: Learning Barriers/Preferences - 12/19/19 1155      Learning Barriers/Preferences   Learning Barriers  None    Learning Preferences  Individual Instruction;Group Instruction;Skilled Demonstration       Education Topics: Hypertension, Hypertension Reduction -Define heart disease and high blood pressure. Discus how high blood pressure affects the body and ways to reduce high blood pressure.   Exercise and Your Heart -Discuss why it is important to exercise, the FITT principles of exercise, normal and abnormal responses to exercise, and how to exercise safely.   Angina -Discuss definition of angina, causes of angina, treatment of angina, and how to decrease risk of having angina.   Cardiac Medications -Review what the following cardiac medications are used for, how they affect the body, and side effects that may occur when taking the medications.  Medications include Aspirin, Beta blockers, calcium channel blockers, ACE Inhibitors, angiotensin receptor blockers, diuretics, digoxin, and antihyperlipidemics.   Congestive Heart Failure -Discuss the definition of CHF, how to  live with CHF, the signs and symptoms of CHF, and how keep track of weight and sodium intake.   Heart Disease and Intimacy -Discus the effect sexual activity has on the heart, how changes occur during intimacy as we age, and safety during sexual activity.   Smoking Cessation / COPD -Discuss different methods to quit smoking, the health benefits of quitting smoking, and the definition of COPD.   Nutrition I: Fats -Discuss the types of cholesterol, what cholesterol does to the heart, and how cholesterol levels can be controlled.   Nutrition II: Labels -Discuss the different components of food labels and how to read food label   Heart Parts/Heart Disease and PAD -Discuss the anatomy of the heart, the pathway of blood circulation through the heart, and these are affected by heart disease.   Stress I: Signs and Symptoms -Discuss the causes of stress, how stress may lead to anxiety and depression, and ways to limit stress.   Stress II: Relaxation -Discuss different types of relaxation techniques to limit stress.   Warning Signs of Stroke / TIA -Discuss definition of a stroke, what the signs and symptoms are of a stroke, and how to identify when someone is having stroke.   Knowledge Questionnaire Score: Knowledge Questionnaire Score - 12/19/19 1156      Knowledge Questionnaire Score   Pre Score  23/24       Core Components/Risk Factors/Patient Goals at Admission: Personal Goals and Risk Factors at Admission - 12/19/19 1157      Core Components/Risk Factors/Patient Goals on Admission    Weight Management  Weight Maintenance    Personal Goal Other  Yes    Personal Goal  Get heart built back up. Be as healthyas he can be.    Intervention  Atten CR 3 x week and to supplement with his home exercise plan 2 x week.    Expected Outcomes  Reach his personal goals.       Core Components/Risk Factors/Patient Goals Review:    Core Components/Risk Factors/Patient Goals at  Discharge (Final Review):    ITP Comments: ITP Comments    Row Name 12/19/19 1119           ITP Comments  This is  patients assessment/orientation visit. He did very well on walk test. He will be returning to work and doing the program.          Comments: Patient arrived for 1st visit/orientation/education at 0800.  Patient was referred to CR by Dr. Candee Furbish due to NSTEMI (I21.4) and Stent Placement (Z95.5). During orientation advised patient on arrival and appointment times what to wear, what to do before, during and after exercise. Reviewed attendance and class policy. Talked about inclement weather and class consultation policy. Pt is scheduled to return Cardiac Rehab on 12/25/19 at 0815. Pt was advised to come to class 15 minutes before class starts. Patient was also given instructions on meeting with the dietician and attending the Family Structure classes. Discussed RPE/Dpysnea scales. Discussed initial THR and how to find their radial and/or carotid pulse. Discussed the initial exercise prescription and how this effects their progress. Pt is eager to get started. Patient participated in warm up stretches followed by light weights and resistance bands. Patient was able to complete 6 minute walk test. Patient did not c/o pain at all.  Patient was measured for the equipment. Discussed equipment safety with patient. Took patient pre-anthropometric measurements. Patient finished visit at 10:15.

## 2019-12-23 ENCOUNTER — Other Ambulatory Visit: Payer: Self-pay

## 2019-12-23 ENCOUNTER — Other Ambulatory Visit: Payer: Self-pay | Admitting: Cardiology

## 2019-12-23 ENCOUNTER — Ambulatory Visit (HOSPITAL_COMMUNITY): Payer: Managed Care, Other (non HMO) | Attending: Cardiology

## 2019-12-23 DIAGNOSIS — I255 Ischemic cardiomyopathy: Secondary | ICD-10-CM | POA: Insufficient documentation

## 2019-12-23 DIAGNOSIS — I5022 Chronic systolic (congestive) heart failure: Secondary | ICD-10-CM

## 2019-12-23 DIAGNOSIS — Z955 Presence of coronary angioplasty implant and graft: Secondary | ICD-10-CM

## 2019-12-25 ENCOUNTER — Other Ambulatory Visit: Payer: Self-pay

## 2019-12-25 ENCOUNTER — Encounter (HOSPITAL_COMMUNITY)
Admission: RE | Admit: 2019-12-25 | Discharge: 2019-12-25 | Disposition: A | Discharge status: Home / Self Care | Payer: Managed Care, Other (non HMO) | Source: Ambulatory Visit | Attending: Cardiology | Admitting: Cardiology

## 2019-12-25 DIAGNOSIS — Z955 Presence of coronary angioplasty implant and graft: Secondary | ICD-10-CM

## 2019-12-25 DIAGNOSIS — I214 Non-ST elevation (NSTEMI) myocardial infarction: Secondary | ICD-10-CM | POA: Diagnosis not present

## 2019-12-25 NOTE — Progress Notes (Signed)
Daily Session Note  Patient Details  Name: Alfred Clay MRN: [id number] Date of Birth: [birth year] Referring Provider:     CARDIAC REHAB PHASE II ORIENTATION from 12/19/2019 in [place]  Referring Provider  Dr. Marlou Porch      Encounter Date: 12/25/2019  Check In: Session Check In - 12/25/19 0815      Check-In   Supervising physician immediately available to respond to emergencies  See telemetry face sheet for immediately available MD    Location  [facility]    Staff Present  Russella Dar, MS, EP, [facility], Exercise Physiologist;Duffy Dantonio Wynetta Emery, RN, BSN    Virtual Visit  No    Medication changes reported      No    Fall or balance concerns reported     No    Tobacco Cessation  No Change    Warm-up and Cool-down  Performed as group-led instruction    Resistance Training Performed  Yes    VAD Patient?  No    PAD/SET Patient?  No      Pain Assessment   Currently in Pain?  No/denies    Pain Score  0-No pain    Multiple Pain Sites  No       Capillary Blood Glucose: No results found for this or any previous visit (from the past 24 hour(s)).    Social History   Tobacco Use  Smoking Status Never Smoker  Smokeless Tobacco Never Used    Goals Met:  Independence with exercise equipment Exercise tolerated well No report of cardiac concerns or symptoms Strength training completed today  Goals Unmet:  Not Applicable  Comments:  Check out 915.   Dr. Kate Sable is Medical Director for [facility] Cardiac and Pulmonary Rehab.

## 2019-12-27 ENCOUNTER — Encounter (HOSPITAL_COMMUNITY)
Admission: RE | Admit: 2019-12-27 | Discharge: 2019-12-27 | Disposition: A | Discharge status: Home / Self Care | Payer: Managed Care, Other (non HMO) | Source: Ambulatory Visit | Attending: Cardiology | Admitting: Cardiology

## 2019-12-27 ENCOUNTER — Other Ambulatory Visit: Payer: Self-pay

## 2019-12-27 DIAGNOSIS — Z955 Presence of coronary angioplasty implant and graft: Secondary | ICD-10-CM | POA: Insufficient documentation

## 2019-12-27 DIAGNOSIS — I214 Non-ST elevation (NSTEMI) myocardial infarction: Secondary | ICD-10-CM | POA: Diagnosis present

## 2019-12-27 NOTE — Progress Notes (Signed)
Daily Session Note  Patient Details  Name: Alfred Clay MRN: [id number] Date of Birth: 15-May-1951 Referring Provider:     CARDIAC REHAB PHASE II ORIENTATION from 12/19/2019 in [place]  Referring Provider  Dr. Marlou Porch      Encounter Date: 12/27/2019  Check In: Session Check In - 12/27/19 0828      Check-In   Supervising physician immediately available to respond to emergencies  See telemetry face sheet for immediately available MD    Location  [facility]    Staff Present  Russella Dar, MS, EP, [facility], Exercise Physiologist;Debra Wynetta Emery, RN, BSN    Virtual Visit  No    Medication changes reported      No    Fall or balance concerns reported     No    Tobacco Cessation  No Change    Warm-up and Cool-down  Performed as group-led instruction    Resistance Training Performed  Yes    VAD Patient?  No    PAD/SET Patient?  No      Pain Assessment   Currently in Pain?  No/denies    Pain Score  0-No pain    Multiple Pain Sites  No       Capillary Blood Glucose: No results found for this or any previous visit (from the past 24 hour(s)).    Social History   Tobacco Use  Smoking Status Never Smoker  Smokeless Tobacco Never Used    Goals Met:  Independence with exercise equipment Exercise tolerated well Personal goals reviewed No report of cardiac concerns or symptoms Strength training completed today  Goals Unmet:  Not Applicable  Comments: Check out: 0915   Dr. Kate Sable is Medical Director for [place] and Pulmonary Rehab.

## 2019-12-30 ENCOUNTER — Encounter (HOSPITAL_COMMUNITY)
Admission: RE | Admit: 2019-12-30 | Discharge: 2019-12-30 | Disposition: A | Discharge status: Home / Self Care | Payer: Managed Care, Other (non HMO) | Source: Ambulatory Visit | Attending: Cardiology | Admitting: Cardiology

## 2019-12-30 ENCOUNTER — Other Ambulatory Visit: Payer: Self-pay

## 2019-12-30 DIAGNOSIS — Z955 Presence of coronary angioplasty implant and graft: Secondary | ICD-10-CM

## 2019-12-30 DIAGNOSIS — I214 Non-ST elevation (NSTEMI) myocardial infarction: Secondary | ICD-10-CM

## 2019-12-30 NOTE — Progress Notes (Signed)
Daily Session Note  Patient Details  Name: Alfred Clay MRN: [id number] Date of Birth: 1951-04-30 Referring Provider:     CARDIAC REHAB PHASE II ORIENTATION from 12/19/2019 in [place]  Referring Provider  Dr. Marlou Porch      Encounter Date: 12/30/2019  Check In: Session Check In - 12/30/19 0815      Check-In   Supervising physician immediately available to respond to emergencies  See telemetry face sheet for immediately available MD    Location  [facility]    Staff Present  Russella Dar, MS, EP, [facility], Exercise Physiologist;Verba Ainley Wynetta Emery, RN, BSN    Virtual Visit  No    Medication changes reported      No    Fall or balance concerns reported     No    Tobacco Cessation  No Change    Warm-up and Cool-down  Performed as group-led instruction    Resistance Training Performed  Yes    VAD Patient?  No    PAD/SET Patient?  No      Pain Assessment   Currently in Pain?  No/denies    Pain Score  0-No pain    Multiple Pain Sites  No       Capillary Blood Glucose: No results found for this or any previous visit (from the past 24 hour(s)).    Social History   Tobacco Use  Smoking Status Never Smoker  Smokeless Tobacco Never Used    Goals Met:  Independence with exercise equipment Exercise tolerated well No report of cardiac concerns or symptoms Strength training completed today  Goals Unmet:  Not Applicable  Comments: Check out 915.   Dr. Kate Sable is Medical Director for [facility] Cardiac and Pulmonary Rehab.

## 2020-01-01 ENCOUNTER — Encounter (HOSPITAL_COMMUNITY): Payer: Managed Care, Other (non HMO)

## 2020-01-01 ENCOUNTER — Ambulatory Visit (INDEPENDENT_AMBULATORY_CARE_PROVIDER_SITE_OTHER): Payer: Managed Care, Other (non HMO) | Admitting: Cardiology

## 2020-01-01 ENCOUNTER — Encounter: Payer: Self-pay | Admitting: Cardiology

## 2020-01-01 ENCOUNTER — Other Ambulatory Visit: Payer: Self-pay

## 2020-01-01 VITALS — BP 118/70 | HR 70 | Ht 70.5 in | Wt 168.0 lb

## 2020-01-01 DIAGNOSIS — I2510 Atherosclerotic heart disease of native coronary artery without angina pectoris: Secondary | ICD-10-CM | POA: Diagnosis not present

## 2020-01-01 DIAGNOSIS — I255 Ischemic cardiomyopathy: Secondary | ICD-10-CM

## 2020-01-01 NOTE — Patient Instructions (Signed)
Medication Instructions:  Your physician recommends that you continue on your current medications as directed. Please refer to the Current Medication list given to you today.  *If you need a refill on your cardiac medications before your next appointment, please call your pharmacy*   Follow-Up: At [facility], you and your health needs are our priority.  As part of our continuing mission to provide you with exceptional heart care, we have created designated Provider Care Teams.  These Care Teams include your primary Cardiologist (physician) and Advanced Practice Providers (APPs -  Physician Assistants and Nurse Practitioners) who all work together to provide you with the care you need, when you need it.   Your next appointment:   6 month(s)  The format for your next appointment:   In Person  Provider:   You may see Candee Furbish, MD or one of the following Advanced Practice Providers on your designated Care Team:    Truitt Merle, NP  Cecilie Kicks, NP  Kathyrn Drown, NP

## 2020-01-01 NOTE — Progress Notes (Signed)
Cardiology Office Note:    Date:  01/01/2020   ID:  Alfred Clay, DOB 30-Oct-1950, MRN [id number]  PCP:  Shelda Pal, DO  Cardiologist:  Candee Furbish, MD  Electrophysiologist:  None   Referring MD: Shelda Pal*     History of Present Illness:    Alfred Clay is a 69 y.o. male here for follow up post ECHO for ischemic cardiomyopathy. Wearing lifevest. EF was 30-35%. Had PCI DES to LAD. New ECHO on 12/23/19 shows normal EF 60%.     Past Medical History:  Diagnosis Date  . Acquired esophageal ring   . Cancer ([place])    skin- upper back and lower back  . Cataract    bilateral-removed  . DM type 2 (diabetes mellitus, type 2) ([place])   . GERD (gastroesophageal reflux disease)   . History of esophagitis   . Hyperlipidemia    no meds  . Hypertension    no meds    Past Surgical History:  Procedure Laterality Date  . BIOPSY  08/06/2019   Procedure: BIOPSY;  Surgeon: Lavena Bullion, DO;  Location: [facility];  Service: Gastroenterology;;  . CATARACT EXTRACTION Bilateral 10/2018  . COLONOSCOPY  2018  . CORONARY STENT INTERVENTION N/A 11/05/2019   Procedure: CORONARY STENT INTERVENTION;  Surgeon: Burnell Blanks, MD;  Location: [place] [facility];  Service: Cardiovascular;  Laterality: N/A;  . CORONARY/GRAFT ACUTE MI REVASCULARIZATION N/A 11/05/2019   Procedure: Coronary/Graft Acute MI Revascularization;  Surgeon: Burnell Blanks, MD;  Location: [place] [facility];  Service: Cardiovascular;  Laterality: N/A;  . ESOPHAGOGASTRODUODENOSCOPY  10/16/2018  . ESOPHAGOGASTRODUODENOSCOPY (EGD) WITH PROPOFOL N/A 08/06/2019   Procedure: ESOPHAGOGASTRODUODENOSCOPY (EGD) WITH PROPOFOL;  Surgeon: Lavena Bullion, DO;  Location: [facility];  Service: Gastroenterology;  Laterality: N/A;  with dilation using ped scope  . HEMORRHOID SURGERY    . LEFT HEART CATH AND CORONARY ANGIOGRAPHY N/A 11/05/2019   Procedure: LEFT HEART CATH AND CORONARY  ANGIOGRAPHY;  Surgeon: Burnell Blanks, MD;  Location: [place] [facility];  Service: Cardiovascular;  Laterality: N/A;  . SAVORY DILATION N/A 08/06/2019   Procedure: SAVORY DILATION;  Surgeon: Lavena Bullion, DO;  Location: [facility];  Service: Gastroenterology;  Laterality: N/A;  . SKIN CANCER EXCISION     back for skin cancer  . UPPER GASTROINTESTINAL ENDOSCOPY    . WISDOM TOOTH EXTRACTION      Current Medications: Current Meds  Medication Sig  . Apoaequorin (PREVAGEN EXTRA STRENGTH) 20 MG CAPS Take 20 mg by mouth daily.  Marland Kitchen aspirin EC 81 MG tablet Take 1 tablet (81 mg total) by mouth daily.  Marland Kitchen atorvastatin (LIPITOR) 80 MG tablet Take 1 tablet (80 mg total) by mouth daily.  . metFORMIN (GLUCOPHAGE) 500 MG tablet Take 500 mg by mouth 2 (two) times daily with a meal.  . metoprolol succinate (TOPROL XL) 25 MG 24 hr tablet Take 0.5 tablets (12.5 mg total) by mouth daily.  . nitroGLYCERIN (NITROSTAT) 0.4 MG SL tablet Place 1 tablet (0.4 mg total) under the tongue every 5 (five) minutes as needed.  . pantoprazole (PROTONIX) 40 MG tablet Take 1 tablet (40 mg total) by mouth 2 (two) times daily.  . sucralfate (CARAFATE) 1 GM/10ML suspension Take 10 mLs (1 g total) by mouth 2 (two) times daily.  . ticagrelor (BRILINTA) 90 MG TABS tablet Take 1 tablet (90 mg total) by mouth 2 (two) times daily.     Allergies:   Ace inhibitors  Social History   Socioeconomic History  . Marital status: Married    Spouse name: Not on file  . Number of children: Not on file  . Years of education: Not on file  . Highest education level: Not on file  Occupational History  . Not on file  Tobacco Use  . Smoking status: Never Smoker  . Smokeless tobacco: Never Used  Substance and Sexual Activity  . Alcohol use: Yes    Comment: ocassionally liquor  . Drug use: Not Currently  . Sexual activity: Not on file  Other Topics Concern  . Not on file  Social History Narrative  . Not on file    Social Determinants of Health   Financial Resource Strain:   . Difficulty of Paying Living Expenses:   Food Insecurity:   . Worried About Charity fundraiser in the Last Year:   . Arboriculturist in the Last Year:   Transportation Needs:   . Film/video editor (Medical):   Marland Kitchen Lack of Transportation (Non-Medical):   Physical Activity:   . Days of Exercise per Week:   . Minutes of Exercise per Session:   Stress:   . Feeling of Stress :   Social Connections:   . Frequency of Communication with Friends and Family:   . Frequency of Social Gatherings with Friends and Family:   . Attends Religious Services:   . Active Member of Clubs or Organizations:   . Attends Archivist Meetings:   Marland Kitchen Marital Status:      Family History: The patient's family history includes Colon cancer in his mother; Colon polyps in his mother; Diabetes in his mother. There is no history of Esophageal cancer, Rectal cancer, or Stomach cancer.  ROS:   Please see the history of present illness.     All other systems reviewed and are negative.  EKGs/Labs/Other Studies Reviewed:    The following studies were reviewed today: Diagnostic Dominance: Right  Intervention     EKG:  EKG is not ordered today.    Recent Labs: 10/16/2019: ALT 9 11/20/2019: BUN 17; Creatinine, Ser 0.99; Hemoglobin 12.9; Platelets 351; Potassium 4.2; Sodium 140  Recent Lipid Panel    Component Value Date/Time   CHOL 137 12/02/2019 0805   TRIG 64.0 12/02/2019 0805   HDL 50.20 12/02/2019 0805   CHOLHDL 3 12/02/2019 0805   VLDL 12.8 12/02/2019 0805   LDLCALC 74 12/02/2019 0805    Physical Exam:    VS:  BP 118/70   Pulse 70   Ht 5' 10.5" (1.791 m)   Wt 168 lb (76.2 kg)   SpO2 99%   BMI 23.76 kg/m     Wt Readings from Last 3 Encounters:  01/01/20 168 lb (76.2 kg)  12/19/19 161 lb 11.2 oz (73.3 kg)  11/26/19 163 lb (73.9 kg)     GEN:  Well nourished, well developed in no acute distress HEENT:  Normal NECK: No JVD; No carotid bruits LYMPHATICS: No lymphadenopathy CARDIAC: RRR, no murmurs, rubs, gallops RESPIRATORY:  Clear to auscultation without rales, wheezing or rhonchi  ABDOMEN: Soft, non-tender, non-distended MUSCULOSKELETAL:  No edema; No deformity  SKIN: Warm and dry NEUROLOGIC:  Alert and oriented x 3 PSYCHIATRIC:  Normal affect   ASSESSMENT:    1. Ischemic cardiomyopathy   2. Coronary artery disease involving native coronary artery of native heart without angina pectoris    PLAN:    In order of problems listed above:  Coronary artery disease anterior  STEMI  - no angina  - Toprol low dose  - Now that EF is 60%, can discontinue LifeVest. No need for ICD.  Excellent news. --He may return to work.  Works at [employer].  No restrictions.  Ischemic cardiomyopathy  - Resolved. Normal  HTN/HL  - Toprol  - Statin high intensity  Esophageal stricture  - EGD ok to proceed 6 months after 10/2019 (04/2020).  What we will do is see him back in 6 months and at that time if he is doing well, allow him to proceed.    Medication Adjustments/Labs and Tests Ordered: Current medicines are reviewed at length with the patient today.  Concerns regarding medicines are outlined above.  No orders of the defined types were placed in this encounter.  No orders of the defined types were placed in this encounter.   Patient Instructions  Medication Instructions:  Your physician recommends that you continue on your current medications as directed. Please refer to the Current Medication list given to you today.  *If you need a refill on your cardiac medications before your next appointment, please call your pharmacy*   Follow-Up: At [facility], you and your health needs are our priority.  As part of our continuing mission to provide you with exceptional heart care, we have created designated Provider Care Teams.  These Care Teams include your primary Cardiologist (physician) and  Advanced Practice Providers (APPs -  Physician Assistants and Nurse Practitioners) who all work together to provide you with the care you need, when you need it.   Your next appointment:   6 month(s)  The format for your next appointment:   In Person  Provider:   You may see Candee Furbish, MD or one of the following Advanced Practice Providers on your designated Care Team:    Truitt Merle, NP  Cecilie Kicks, NP  Kathyrn Drown, NP        Signed, Candee Furbish, MD  01/01/2020 9:56 AM    [place]

## 2020-01-03 ENCOUNTER — Encounter (HOSPITAL_COMMUNITY): Payer: Managed Care, Other (non HMO)

## 2020-01-06 ENCOUNTER — Other Ambulatory Visit: Payer: Self-pay

## 2020-01-06 ENCOUNTER — Encounter (HOSPITAL_COMMUNITY)
Admission: RE | Admit: 2020-01-06 | Discharge: 2020-01-06 | Disposition: A | Discharge status: Home / Self Care | Payer: Managed Care, Other (non HMO) | Source: Ambulatory Visit | Attending: Cardiology | Admitting: Cardiology

## 2020-01-06 DIAGNOSIS — I214 Non-ST elevation (NSTEMI) myocardial infarction: Secondary | ICD-10-CM

## 2020-01-06 DIAGNOSIS — K222 Esophageal obstruction: Secondary | ICD-10-CM

## 2020-01-06 DIAGNOSIS — Z955 Presence of coronary angioplasty implant and graft: Secondary | ICD-10-CM

## 2020-01-06 DIAGNOSIS — R1310 Dysphagia, unspecified: Secondary | ICD-10-CM

## 2020-01-06 DIAGNOSIS — R1319 Other dysphagia: Secondary | ICD-10-CM

## 2020-01-06 MED ORDER — SUCRALFATE 1 GM/10ML PO SUSP: 1.0000 g | Freq: Two times a day (BID) | ORAL | 1 refills | Status: DC

## 2020-01-06 NOTE — Progress Notes (Signed)
Daily Session Note  Patient Details  Name: Alfred Clay MRN: [id number] Date of Birth: December 25, 1950 Referring Provider:     CARDIAC REHAB PHASE II ORIENTATION from 12/19/2019 in [place]  Referring Provider  Dr. Marlou Porch      Encounter Date: 01/06/2020  Check In: Session Check In - 01/06/20 0815      Check-In   Supervising physician immediately available to respond to emergencies  See telemetry face sheet for immediately available MD    Location  [facility]    Staff Present  Russella Dar, MS, EP, [facility], Exercise Physiologist;Keanna Tugwell Wynetta Emery, RN, BSN    Virtual Visit  No    Medication changes reported      No    Fall or balance concerns reported     No    Tobacco Cessation  No Change    Warm-up and Cool-down  Performed as group-led instruction    Resistance Training Performed  Yes    VAD Patient?  No    PAD/SET Patient?  No      Pain Assessment   Currently in Pain?  No/denies    Pain Score  0-No pain    Multiple Pain Sites  No       Capillary Blood Glucose: No results found for this or any previous visit (from the past 24 hour(s)).    Social History   Tobacco Use  Smoking Status Never Smoker  Smokeless Tobacco Never Used    Goals Met:  Independence with exercise equipment Exercise tolerated well No report of cardiac concerns or symptoms Strength training completed today  Goals Unmet:  Not Applicable  Comments: Check out 915.   Dr. Kate Sable is Medical Director for [facility] Cardiac and Pulmonary Rehab.

## 2020-01-07 ENCOUNTER — Ambulatory Visit (INDEPENDENT_AMBULATORY_CARE_PROVIDER_SITE_OTHER): Payer: Managed Care, Other (non HMO) | Admitting: Family Medicine

## 2020-01-07 ENCOUNTER — Encounter: Payer: Self-pay | Admitting: Family Medicine

## 2020-01-07 DIAGNOSIS — R05 Cough: Secondary | ICD-10-CM | POA: Diagnosis not present

## 2020-01-07 DIAGNOSIS — R059 Cough, unspecified: Secondary | ICD-10-CM

## 2020-01-07 MED ORDER — LEVOCETIRIZINE DIHYDROCHLORIDE 5 MG PO TABS: 5.0000 mg | ORAL_TABLET | Freq: Every evening | ORAL | 2 refills | Status: DC

## 2020-01-07 MED ORDER — ALBUTEROL SULFATE HFA 108 (90 BASE) MCG/ACT IN AERS: 2.0000 | INHALATION_SPRAY | Freq: Four times a day (QID) | RESPIRATORY_TRACT | 1 refills | Status: DC | PRN

## 2020-01-07 NOTE — Progress Notes (Signed)
Chief Complaint  Patient presents with  . Cough    one month    Subjective: Patient is a 69 y.o. male here for cough. Due to COVID-19 pandemic, we are interacting via telephone. I verified patient's ID using 2 identifiers. Patient agreed to proceed with visit via this method. Patient is at home, I am at office. Patient and I are present for visit.   Patient has had a productive cough over the past month with associated wheezing.  Denies shortness of breath, chest pain, chest tightness, sore throat, itchy throat, runny/stuffy nose, itchy/watery eyes, ear pain/drainage from the ears, unexplained swelling or weight changes.  He does have a history of allergies.  Unsure about asthma.  He does not smoke/has never smoked.  No exposure to inhaled chemical substances.  He is not having any fevers.  He does have a history of reflux and states it is not similar.  Past Medical History:  Diagnosis Date  . Acquired esophageal ring   . Cancer ([place])    skin- upper back and lower back  . Cataract    bilateral-removed  . DM type 2 (diabetes mellitus, type 2) ([place])   . GERD (gastroesophageal reflux disease)   . History of esophagitis   . Hyperlipidemia    no meds  . Hypertension    no meds    Objective: No conversational dyspnea Age appropriate judgment and insight Nml affect and mood  Assessment and Plan: Cough - Plan: levocetirizine (XYZAL) 5 MG tablet, albuterol (VENTOLIN HFA) 108 (90 Base) MCG/ACT inhaler  We will treat for both allergies and allergy induced asthma.  Albuterol inhaler as needed and daily Xyzal.  Does not sound like reflux.  Unlikely to be COPD, pneumonitis, or infection.  Doubt medication side effect. If he is no better in the next 2 weeks, I would like him to come in in person to be reevaluated. The patient voiced understanding and agreement to the plan.  [place], DO 01/07/20  12:04 PM

## 2020-01-08 ENCOUNTER — Other Ambulatory Visit: Payer: Self-pay

## 2020-01-08 ENCOUNTER — Encounter (HOSPITAL_COMMUNITY)
Admission: RE | Admit: 2020-01-08 | Discharge: 2020-01-08 | Disposition: A | Discharge status: Home / Self Care | Payer: Managed Care, Other (non HMO) | Source: Ambulatory Visit | Attending: Cardiology | Admitting: Cardiology

## 2020-01-08 DIAGNOSIS — I214 Non-ST elevation (NSTEMI) myocardial infarction: Secondary | ICD-10-CM

## 2020-01-08 DIAGNOSIS — Z955 Presence of coronary angioplasty implant and graft: Secondary | ICD-10-CM

## 2020-01-08 NOTE — Progress Notes (Signed)
Daily Session Note  Patient Details  Name: Alfred Clay MRN: [id number] Date of Birth: [birth year] Referring Provider:     CARDIAC REHAB PHASE II ORIENTATION from 12/19/2019 in [place]  Referring Provider  Dr. Marlou Porch      Encounter Date: 01/08/2020  Check In: Session Check In - 01/08/20 0844      Check-In   Supervising physician immediately available to respond to emergencies  See telemetry face sheet for immediately available MD    Location  [facility]    Staff Present  Russella Dar, MS, EP, [facility], Exercise Physiologist;Debra Wynetta Emery, RN, BSN    Virtual Visit  No    Medication changes reported      No    Fall or balance concerns reported     No    Tobacco Cessation  No Change    Warm-up and Cool-down  Performed as group-led instruction    Resistance Training Performed  Yes    VAD Patient?  No    PAD/SET Patient?  No      Pain Assessment   Currently in Pain?  No/denies    Pain Score  0-No pain    Multiple Pain Sites  No       Capillary Blood Glucose: No results found for this or any previous visit (from the past 24 hour(s)).    Social History   Tobacco Use  Smoking Status Never Smoker  Smokeless Tobacco Never Used    Goals Met:  Independence with exercise equipment Exercise tolerated well Personal goals reviewed No report of cardiac concerns or symptoms Strength training completed today  Goals Unmet:  Not Applicable  Comments: Check out: 0915   Dr. Kate Sable is Medical Director for [place] and Pulmonary Rehab.

## 2020-01-10 ENCOUNTER — Other Ambulatory Visit: Payer: Self-pay

## 2020-01-10 ENCOUNTER — Encounter (HOSPITAL_COMMUNITY)
Admission: RE | Admit: 2020-01-10 | Discharge: 2020-01-10 | Disposition: A | Discharge status: Home / Self Care | Payer: Managed Care, Other (non HMO) | Source: Ambulatory Visit | Attending: Cardiology | Admitting: Cardiology

## 2020-01-10 DIAGNOSIS — I214 Non-ST elevation (NSTEMI) myocardial infarction: Secondary | ICD-10-CM

## 2020-01-10 DIAGNOSIS — Z955 Presence of coronary angioplasty implant and graft: Secondary | ICD-10-CM

## 2020-01-10 NOTE — Progress Notes (Signed)
Daily Session Note  Patient Details  Name: Alfred Clay MRN: [id number] Date of Birth: 15-Feb-1951 Referring Provider:     CARDIAC REHAB PHASE II ORIENTATION from 12/19/2019 in [place]  Referring Provider  Dr. Marlou Porch      Encounter Date: 01/10/2020  Check In: Session Check In - 01/10/20 0807      Check-In   Supervising physician immediately available to respond to emergencies  See telemetry face sheet for immediately available MD    Location  [facility]    Staff Present  Russella Dar, MS, EP, [facility], Exercise Physiologist;Jovanni Eckhart Wynetta Emery, RN, BSN;Other    Virtual Visit  No    Medication changes reported      No    Fall or balance concerns reported     No    Tobacco Cessation  No Change    Warm-up and Cool-down  Performed as group-led instruction    Resistance Training Performed  Yes    VAD Patient?  No    PAD/SET Patient?  No      Pain Assessment   Currently in Pain?  No/denies    Pain Score  0-No pain    Multiple Pain Sites  No       Capillary Blood Glucose: No results found for this or any previous visit (from the past 24 hour(s)).    Social History   Tobacco Use  Smoking Status Never Smoker  Smokeless Tobacco Never Used    Goals Met:  Independence with exercise equipment Exercise tolerated well No report of cardiac concerns or symptoms Strength training completed today  Goals Unmet:  Not Applicable  Comments: Check out 915.   Dr. Kate Sable is Medical Director for [facility] Cardiac and Pulmonary Rehab.

## 2020-01-13 ENCOUNTER — Encounter (HOSPITAL_COMMUNITY)
Admission: RE | Admit: 2020-01-13 | Discharge: 2020-01-13 | Disposition: A | Discharge status: Home / Self Care | Payer: Managed Care, Other (non HMO) | Source: Ambulatory Visit | Attending: Cardiology | Admitting: Cardiology

## 2020-01-13 ENCOUNTER — Other Ambulatory Visit: Payer: Self-pay

## 2020-01-13 DIAGNOSIS — I214 Non-ST elevation (NSTEMI) myocardial infarction: Secondary | ICD-10-CM

## 2020-01-13 DIAGNOSIS — Z955 Presence of coronary angioplasty implant and graft: Secondary | ICD-10-CM

## 2020-01-13 NOTE — Progress Notes (Signed)
Daily Session Note  Patient Details  Name: Alfred Clay MRN: [id number] Date of Birth: 11/26/1950 Referring Provider:     CARDIAC REHAB PHASE II ORIENTATION from 12/19/2019 in [place]  Referring Provider  Dr. Marlou Porch      Encounter Date: 01/13/2020  Check In: Session Check In - 01/13/20 0815      Check-In   Supervising physician immediately available to respond to emergencies  See telemetry face sheet for immediately available MD    Location  [facility]    Staff Present  Russella Dar, MS, EP, [facility], Exercise Physiologist;Algis Lehenbauer Wynetta Emery, RN, BSN;Other    Virtual Visit  No    Medication changes reported      No    Fall or balance concerns reported     No    Tobacco Cessation  No Change    Warm-up and Cool-down  Performed as group-led instruction    Resistance Training Performed  Yes    VAD Patient?  No    PAD/SET Patient?  No      Pain Assessment   Currently in Pain?  No/denies    Pain Score  0-No pain    Multiple Pain Sites  No       Capillary Blood Glucose: No results found for this or any previous visit (from the past 24 hour(s)).    Social History   Tobacco Use  Smoking Status Never Smoker  Smokeless Tobacco Never Used    Goals Met:  Independence with exercise equipment Exercise tolerated well No report of cardiac concerns or symptoms Strength training completed today  Goals Unmet:  Not Applicable  Comments: Check out 915.   Dr. Kate Sable is Medical Director for [facility] Cardiac and Pulmonary Rehab.

## 2020-01-13 NOTE — Progress Notes (Signed)
Cardiac Individual Treatment Plan  Patient Details  Name: Alfred Clay MRN: [id number] Date of Birth: January 03, 1951 Referring Provider:     CARDIAC REHAB PHASE II ORIENTATION from 12/19/2019 in [place]  Referring Provider  Dr. Marlou Porch      Initial Encounter Date:    CARDIAC REHAB PHASE II ORIENTATION from 12/19/2019 in [place]  Date  12/19/19      Visit Diagnosis: NSTEMI (non-ST elevated myocardial infarction) [facility])  Status post coronary artery stent placement  Patient's Home Medications on Admission:  Current Outpatient Medications:  .  albuterol (VENTOLIN HFA) 108 (90 Base) MCG/ACT inhaler, Inhale 2 puffs into the lungs every 6 (six) hours as needed for wheezing or shortness of breath., Disp: 18 g, Rfl: 1 .  Apoaequorin (PREVAGEN EXTRA STRENGTH) 20 MG CAPS, Take 20 mg by mouth daily., Disp: , Rfl:  .  aspirin EC 81 MG tablet, Take 1 tablet (81 mg total) by mouth daily., Disp: 90 tablet, Rfl: 3 .  atorvastatin (LIPITOR) 80 MG tablet, Take 1 tablet (80 mg total) by mouth daily., Disp: 30 tablet, Rfl: 6 .  levocetirizine (XYZAL) 5 MG tablet, Take 1 tablet (5 mg total) by mouth every evening., Disp: 30 tablet, Rfl: 2 .  metFORMIN (GLUCOPHAGE) 500 MG tablet, Take 500 mg by mouth 2 (two) times daily with a meal., Disp: , Rfl:  .  metoprolol succinate (TOPROL XL) 25 MG 24 hr tablet, Take 0.5 tablets (12.5 mg total) by mouth daily., Disp: 30 tablet, Rfl: 6 .  nitroGLYCERIN (NITROSTAT) 0.4 MG SL tablet, Place 1 tablet (0.4 mg total) under the tongue every 5 (five) minutes as needed., Disp: 25 tablet, Rfl: 12 .  pantoprazole (PROTONIX) 40 MG tablet, Take 1 tablet (40 mg total) by mouth 2 (two) times daily., Disp: 180 tablet, Rfl: 5 .  sucralfate (CARAFATE) 1 GM/10ML suspension, Take 10 mLs (1 g total) by mouth 2 (two) times daily., Disp: 420 mL, Rfl: 1 .  ticagrelor (BRILINTA) 90 MG TABS tablet, Take 1 tablet (90 mg total) by mouth 2 (two)  times daily., Disp: 60 tablet, Rfl: 11  Past Medical History: Past Medical History:  Diagnosis Date  . Acquired esophageal ring   . Cancer ([place])    skin- upper back and lower back  . Cataract    bilateral-removed  . DM type 2 (diabetes mellitus, type 2) ([place])   . GERD (gastroesophageal reflux disease)   . History of esophagitis   . Hyperlipidemia    no meds  . Hypertension    no meds    Tobacco Use: Social History   Tobacco Use  Smoking Status Never Smoker  Smokeless Tobacco Never Used    Labs: Recent Review Scientist, physiological    Labs for ITP Cardiac and Pulmonary Rehab Latest Ref Rng & Units 10/16/2019 12/02/2019   Cholestrol 0 - 200 mg/dL 212(H) 137   LDLCALC 0 - 99 mg/dL 143(H) 74   HDL >39.00 mg/dL 57.20 50.20   Trlycerides 0.0 - 149.0 mg/dL 59.0 64.0   Hemoglobin A1c 4.6 - 6.5 % 6.5 -      Capillary Blood Glucose: Lab Results  Component Value Date   GLUCAP 112 (H) 11/08/2019   GLUCAP 100 (H) 08/06/2019     Exercise Target Goals: Exercise Program Goal: Individual exercise prescription set using results from initial 6 min walk test and THRR while considering  patient's activity barriers and safety.   Exercise Prescription Goal: Starting with aerobic activity 30  plus minutes a day, 3 days per week for initial exercise prescription. Provide home exercise prescription and guidelines that participant acknowledges understanding prior to discharge.  Activity Barriers & Risk Stratification: Activity Barriers & Cardiac Risk Stratification - 12/19/19 1136      Activity Barriers & Cardiac Risk Stratification   Activity Barriers  --   (L) knee is weak   Cardiac Risk Stratification  High       6 Minute Walk: 6 Minute Walk    Row Name 12/19/19 1132         6 Minute Walk   Phase  Initial     Distance  1300 feet     Walk Time  6 minutes     # of Rest Breaks  0     MPH  2.46     METS  2.88     RPE  11     Perceived Dyspnea   11     VO2 Peak  12.55      Symptoms  No     Resting HR  73 bpm     Resting BP  102/70     Resting Oxygen Saturation   100 %     Exercise Oxygen Saturation  during 6 min walk  93 %     Max Ex. HR  115 bpm     Max Ex. BP  140/82     2 Minute Post BP  110/70        Oxygen Initial Assessment:   Oxygen Re-Evaluation:   Oxygen Discharge (Final Oxygen Re-Evaluation):   Initial Exercise Prescription: Initial Exercise Prescription - 12/19/19 1100      Date of Initial Exercise RX and Referring Provider   Date  12/19/19    Referring Provider  Dr. Marlou Porch    Expected Discharge Date  03/20/20      Treadmill   MPH  2    Grade  0    Minutes  17    METs  2.53      Prescription Details   Frequency (times per week)  3    Duration  Progress to 30 minutes of continuous aerobic without signs/symptoms of physical distress      Intensity   THRR 40-80% of Max Heartrate  104-120-136    Ratings of Perceived Exertion  11-15    Perceived Dyspnea  0-4      Progression   Progression  Continue to follow PAD protocol      Resistance Training   Training Prescription  Yes    Weight  1    Reps  10-15       Perform Capillary Blood Glucose checks as needed.  Exercise Prescription Changes:  Exercise Prescription Changes    Row Name 01/10/20 1200             Response to Exercise   Blood Pressure (Admit)  110/60       Blood Pressure (Exercise)  152/80       Blood Pressure (Exit)  118/60       Heart Rate (Admit)  61 bpm       Heart Rate (Exercise)  82 bpm       Heart Rate (Exit)  73 bpm       Rating of Perceived Exertion (Exercise)  11       Duration  Continue with 30 min of aerobic exercise without signs/symptoms of physical distress.       Intensity  THRR unchanged  Progression   Progression  Continue to progress workloads to maintain intensity without signs/symptoms of physical distress.       Average METs  3.06         Resistance Training   Training Prescription  Yes       Weight  2        Reps  10-15       Time  10 Minutes         Treadmill   MPH  2.3       Grade  0       Minutes  17       METs  2.76         Recumbant Elliptical   Level  1       RPM  25       Watts  66       Minutes  22       METs  3.6         Home Exercise Plan   Plans to continue exercise at  Home (comment)       Frequency  Add 2 additional days to program exercise sessions.       Initial Home Exercises Provided  12/19/19          Exercise Comments:  Exercise Comments    Row Name 12/19/19 1144           Exercise Comments  Patient is eager to get started. This was his assessment/orientation visit. We will progress as tolerated.          Exercise Goals and Review:  Exercise Goals    Row Name 12/19/19 1142             Exercise Goals   Increase Physical Activity  Yes       Intervention  Provide advice, education, support and counseling about physical activity/exercise needs.;Develop an individualized exercise prescription for aerobic and resistive training based on initial evaluation findings, risk stratification, comorbidities and participant's personal goals.       Expected Outcomes  Short Term: Attend rehab on a regular basis to increase amount of physical activity.;Long Term: Add in home exercise to make exercise part of routine and to increase amount of physical activity.       Increase Strength and Stamina  Yes       Intervention  Provide advice, education, support and counseling about physical activity/exercise needs.;Develop an individualized exercise prescription for aerobic and resistive training based on initial evaluation findings, risk stratification, comorbidities and participant's personal goals.       Expected Outcomes  Short Term: Perform resistance training exercises routinely during rehab and add in resistance training at home;Long Term: Improve cardiorespiratory fitness, muscular endurance and strength as measured by increased METs and functional capacity (6MWT)        Able to understand and use rate of perceived exertion (RPE) scale  Yes       Intervention  Provide education and explanation on how to use RPE scale       Expected Outcomes  Short Term: Able to use RPE daily in rehab to express subjective intensity level;Long Term:  Able to use RPE to guide intensity level when exercising independently       Knowledge and understanding of Target Heart Rate Range (THRR)  Yes       Intervention  Provide education and explanation of THRR including how the numbers were predicted and where they are located for reference  Expected Outcomes  Short Term: Able to use daily as guideline for intensity in rehab;Long Term: Able to use THRR to govern intensity when exercising independently       Able to check pulse independently  Yes       Intervention  Provide education and demonstration on how to check pulse in carotid and radial arteries.;Review the importance of being able to check your own pulse for safety during independent exercise       Expected Outcomes  Short Term: Able to explain why pulse checking is important during independent exercise;Long Term: Able to check pulse independently and accurately       Understanding of Exercise Prescription  Yes       Intervention  Provide education, explanation, and written materials on patient's individual exercise prescription       Expected Outcomes  Short Term: Able to explain program exercise prescription;Long Term: Able to explain home exercise prescription to exercise independently          Exercise Goals Re-Evaluation :    Discharge Exercise Prescription (Final Exercise Prescription Changes): Exercise Prescription Changes - 01/10/20 1200      Response to Exercise   Blood Pressure (Admit)  110/60    Blood Pressure (Exercise)  152/80    Blood Pressure (Exit)  118/60    Heart Rate (Admit)  61 bpm    Heart Rate (Exercise)  82 bpm    Heart Rate (Exit)  73 bpm    Rating of Perceived Exertion (Exercise)  11     Duration  Continue with 30 min of aerobic exercise without signs/symptoms of physical distress.    Intensity  THRR unchanged      Progression   Progression  Continue to progress workloads to maintain intensity without signs/symptoms of physical distress.    Average METs  3.06      Resistance Training   Training Prescription  Yes    Weight  2    Reps  10-15    Time  10 Minutes      Treadmill   MPH  2.3    Grade  0    Minutes  17    METs  2.76      Recumbant Elliptical   Level  1    RPM  25    Watts  66    Minutes  22    METs  3.6      Home Exercise Plan   Plans to continue exercise at  Home (comment)    Frequency  Add 2 additional days to program exercise sessions.    Initial Home Exercises Provided  12/19/19       Nutrition:  Target Goals: Understanding of nutrition guidelines, daily intake of sodium 1500mg , cholesterol 200mg , calories 30% from fat and 7% or less from saturated fats, daily to have 5 or more servings of fruits and vegetables.  Biometrics: Pre Biometrics - 12/19/19 1145      Pre Biometrics   Height  5' 10.5" (1.791 m)    Weight  73.3 kg    Waist Circumference  31 inches    Hip Circumference  36 inches    Waist to Hip Ratio  0.86 %    BMI (Calculated)  22.87    Triceps Skinfold  3 mm    % Body Fat  29 %    Grip Strength  35.6 kg    Flexibility  9.8 in    Single Leg Stand  60 seconds  Nutrition Therapy Plan and Nutrition Goals: Nutrition Therapy & Goals - 01/13/20 1515      Personal Nutrition Goals   Comments  We continue to work with our RD to schedule classes. In the interim, we are providing education through hand-outs. Patient says he has not changed his diet since he started the program but continues to eat heart healthy.      Intervention Plan   Intervention  Nutrition handout(s) given to patient.       Nutrition Assessments: Nutrition Assessments - 12/19/19 1153      MEDFICTS Scores   Pre Score  9   Patient has not  gotten his appetite back fully so he is eating very little.      Nutrition Goals Re-Evaluation:   Nutrition Goals Discharge (Final Nutrition Goals Re-Evaluation):   Psychosocial: Target Goals: Acknowledge presence or absence of significant depression and/or stress, maximize coping skills, provide positive support system. Participant is able to verbalize types and ability to use techniques and skills needed for reducing stress and depression.  Initial Review & Psychosocial Screening: Initial Psych Review & Screening - 12/19/19 1149      Initial Review   Current issues with  None Identified      Family Dynamics   Good Support System?  Yes      Barriers   Psychosocial barriers to participate in program  There are no identifiable barriers or psychosocial needs.      Screening Interventions   Interventions  Encouraged to exercise    Expected Outcomes  Short Term goal: Identification and review with participant of any Quality of Life or Depression concerns found by scoring the questionnaire.;Long Term goal: The participant improves quality of Life and PHQ9 Scores as seen by post scores and/or verbalization of changes       Quality of Life Scores: Quality of Life - 12/19/19 1004      Quality of Life   Select  Quality of Life      Quality of Life Scores   Health/Function Pre  25.9 %    Socioeconomic Pre  29.29 %    Psych/Spiritual Pre  28.14 %    Family Pre  28.8 %    GLOBAL Pre  27.49 %      Scores of 19 and below usually indicate a poorer quality of life in these areas.  A difference of  2-3 points is a clinically meaningful difference.  A difference of 2-3 points in the total score of the Quality of Life Index has been associated with significant improvement in overall quality of life, self-image, physical symptoms, and general health in studies assessing change in quality of life.  PHQ-9: Recent Review Flowsheet Data    Depression screen [facility] 2/9 12/19/2019   Decreased  Interest 0   Down, Depressed, Hopeless 0   PHQ - 2 Score 0   Altered sleeping 0   Tired, decreased energy 0   Change in appetite 1   Feeling bad or failure about yourself  0   Trouble concentrating 0   Moving slowly or fidgety/restless 0   Suicidal thoughts 0   PHQ-9 Score 1   Difficult doing work/chores Not difficult at all     Interpretation of Total Score  Total Score Depression Severity:  1-4 = Minimal depression, 5-9 = Mild depression, 10-14 = Moderate depression, 15-19 = Moderately severe depression, 20-27 = Severe depression   Psychosocial Evaluation and Intervention: Psychosocial Evaluation - 12/19/19 1151  Psychosocial Evaluation & Interventions   Interventions  Encouraged to exercise with the program and follow exercise prescription    Continue Psychosocial Services   No Follow up required       Psychosocial Re-Evaluation: Psychosocial Re-Evaluation    [place] Name 01/13/20 1516             Psychosocial Re-Evaluation   Current issues with  None Identified       Comments  Patient's initial QOL score was 27.49 and his PHQ-9 score was 1 with no psychosocial issues identified. Will continue to monitor.       Expected Outcomes  Patient will have no psychosocial issues identified at discharge.       Interventions  Stress management education;Encouraged to attend Cardiac Rehabilitation for the exercise;Relaxation education       Continue Psychosocial Services   No Follow up required          Psychosocial Discharge (Final Psychosocial Re-Evaluation): Psychosocial Re-Evaluation - 01/13/20 1516      Psychosocial Re-Evaluation   Current issues with  None Identified    Comments  Patient's initial QOL score was 27.49 and his PHQ-9 score was 1 with no psychosocial issues identified. Will continue to monitor.    Expected Outcomes  Patient will have no psychosocial issues identified at discharge.    Interventions  Stress management education;Encouraged to attend Cardiac  Rehabilitation for the exercise;Relaxation education    Continue Psychosocial Services   No Follow up required       Vocational Rehabilitation: Provide vocational rehab assistance to qualifying candidates.   Vocational Rehab Evaluation & Intervention: Vocational Rehab - 12/19/19 1156      Initial Vocational Rehab Evaluation & Intervention   Assessment shows need for Vocational Rehabilitation  No       Education: Education Goals: Education classes will be provided on a weekly basis, covering required topics. Participant will state understanding/return demonstration of topics presented.  Learning Barriers/Preferences: Learning Barriers/Preferences - 12/19/19 1155      Learning Barriers/Preferences   Learning Barriers  None    Learning Preferences  Individual Instruction;Group Instruction;Skilled Demonstration       Education Topics: Hypertension, Hypertension Reduction -Define heart disease and high blood pressure. Discus how high blood pressure affects the body and ways to reduce high blood pressure.   Exercise and Your Heart -Discuss why it is important to exercise, the FITT principles of exercise, normal and abnormal responses to exercise, and how to exercise safely.   CARDIAC REHAB PHASE II EXERCISE from 01/08/2020 in [place]  Date  01/08/20  Educator  DC  Instruction Review Code  2- Demonstrated Understanding      Angina -Discuss definition of angina, causes of angina, treatment of angina, and how to decrease risk of having angina.   Cardiac Medications -Review what the following cardiac medications are used for, how they affect the body, and side effects that may occur when taking the medications.  Medications include Aspirin, Beta blockers, calcium channel blockers, ACE Inhibitors, angiotensin receptor blockers, diuretics, digoxin, and antihyperlipidemics.   Congestive Heart Failure -Discuss the definition of CHF, how to live with CHF,  the signs and symptoms of CHF, and how keep track of weight and sodium intake.   Heart Disease and Intimacy -Discus the effect sexual activity has on the heart, how changes occur during intimacy as we age, and safety during sexual activity.   Smoking Cessation / COPD -Discuss different methods to quit smoking, the health benefits of quitting  smoking, and the definition of COPD.   Nutrition I: Fats -Discuss the types of cholesterol, what cholesterol does to the heart, and how cholesterol levels can be controlled.   Nutrition II: Labels -Discuss the different components of food labels and how to read food label   Heart Parts/Heart Disease and PAD -Discuss the anatomy of the heart, the pathway of blood circulation through the heart, and these are affected by heart disease.   Stress I: Signs and Symptoms -Discuss the causes of stress, how stress may lead to anxiety and depression, and ways to limit stress.   Stress II: Relaxation -Discuss different types of relaxation techniques to limit stress.   Warning Signs of Stroke / TIA -Discuss definition of a stroke, what the signs and symptoms are of a stroke, and how to identify when someone is having stroke.   CARDIAC REHAB PHASE II EXERCISE from 01/08/2020 in [place]  Date  12/25/19  Educator  Etheleen Mayhew  Instruction Review Code  2- Demonstrated Understanding      Knowledge Questionnaire Score: Knowledge Questionnaire Score - 12/19/19 1156      Knowledge Questionnaire Score   Pre Score  23/24       Core Components/Risk Factors/Patient Goals at Admission: Personal Goals and Risk Factors at Admission - 12/19/19 1157      Core Components/Risk Factors/Patient Goals on Admission    Weight Management  Weight Maintenance    Personal Goal Other  Yes    Personal Goal  Get heart built back up. Be as healthyas he can be.    Intervention  Atten CR 3 x week and to supplement with his home exercise plan 2 x  week.    Expected Outcomes  Reach his personal goals.       Core Components/Risk Factors/Patient Goals Review:  Goals and Risk Factor Review    Row Name 01/13/20 1512             Core Components/Risk Factors/Patient Goals Review   Personal Goals Review  -- Get heart built back up; be as healthy as I can be.       Review  Patient has completed 8 sessions losing 6.6 lbs since last 30 day review. He is doing well in the program with some progression. He says he feels better and more relaxed since he started the program. Will continue to monitor for progress.       Expected Outcomes  Patient will continue to attend sessions and complete the program meeting his personal goals.          Core Components/Risk Factors/Patient Goals at Discharge (Final Review):  Goals and Risk Factor Review - 01/13/20 1512      Core Components/Risk Factors/Patient Goals Review   Personal Goals Review  --   Get heart built back up; be as healthy as I can be.   Review  Patient has completed 8 sessions losing 6.6 lbs since last 30 day review. He is doing well in the program with some progression. He says he feels better and more relaxed since he started the program. Will continue to monitor for progress.    Expected Outcomes  Patient will continue to attend sessions and complete the program meeting his personal goals.       ITP Comments: ITP Comments    Row Name 12/19/19 1119           ITP Comments  This is patients assessment/orientation visit. He did very well on walk test.  He will be returning to work and doing the program.          Comments: ITP REVIEW Pt is making expected progress toward Cardiac Rehab goals after completing 8 sessions. Recommend continued exercise, life style modification, education, and increased stamina and strength.

## 2020-01-14 ENCOUNTER — Ambulatory Visit: Payer: Managed Care, Other (non HMO) | Attending: Internal Medicine

## 2020-01-14 ENCOUNTER — Telehealth: Payer: Self-pay | Admitting: Cardiology

## 2020-01-14 DIAGNOSIS — Z23 Encounter for immunization: Secondary | ICD-10-CM

## 2020-01-14 NOTE — Telephone Encounter (Signed)
FMLA/disability form received from [employer]. Placed in box for Dr. Marlou Porch to review. 01/14/20 vlm

## 2020-01-14 NOTE — Progress Notes (Signed)
   Covid-19 Vaccination Clinic  Name:  Alfred Clay    MRN: [id number] DOB: [birth year]  01/14/2020  [name] was observed post Covid-19 immunization for 15 minutes without incident. He was provided with Vaccine Information Sheet and instruction to access the V-Safe system.   [name] was instructed to call 911 with any severe reactions post vaccine: Marland Kitchen Difficulty breathing  . Swelling of face and throat  . A fast heartbeat  . A bad rash all over body  . Dizziness and weakness   Immunizations Administered    Name Date Dose VIS Date Route   Moderna COVID-19 Vaccine 01/14/2020  8:28 AM 0.5 mL 08/2019 Intramuscular   Manufacturer: Moderna   Lot: [id number]   [id number]

## 2020-01-15 ENCOUNTER — Encounter (HOSPITAL_COMMUNITY): Payer: Managed Care, Other (non HMO)

## 2020-01-17 ENCOUNTER — Other Ambulatory Visit: Payer: Self-pay

## 2020-01-17 ENCOUNTER — Encounter (HOSPITAL_COMMUNITY)
Admission: RE | Admit: 2020-01-17 | Discharge: 2020-01-17 | Disposition: A | Discharge status: Home / Self Care | Payer: Managed Care, Other (non HMO) | Source: Ambulatory Visit | Attending: Cardiology | Admitting: Cardiology

## 2020-01-17 DIAGNOSIS — I214 Non-ST elevation (NSTEMI) myocardial infarction: Secondary | ICD-10-CM

## 2020-01-17 DIAGNOSIS — Z955 Presence of coronary angioplasty implant and graft: Secondary | ICD-10-CM

## 2020-01-17 NOTE — Progress Notes (Signed)
Daily Session Note  Patient Details  Name: Alfred Clay MRN: [id number] Date of Birth: [birth year] Referring Provider:     CARDIAC REHAB PHASE II ORIENTATION from 12/19/2019 in [place]  Referring Provider  Dr. Marlou Porch      Encounter Date: 01/17/2020  Check In: Session Check In - 01/17/20 0831      Check-In   Supervising physician immediately available to respond to emergencies  See telemetry face sheet for immediately available MD    Location  [facility]    Staff Present  Russella Dar, MS, EP, [facility], Exercise Physiologist;Debra Wynetta Emery, RN, BSN    Virtual Visit  No    Medication changes reported      No    Fall or balance concerns reported     No    Tobacco Cessation  No Change    Warm-up and Cool-down  Performed as group-led instruction    Resistance Training Performed  Yes    VAD Patient?  No    PAD/SET Patient?  No      Pain Assessment   Currently in Pain?  No/denies    Pain Score  0-No pain    Multiple Pain Sites  No       Capillary Blood Glucose: No results found for this or any previous visit (from the past 24 hour(s)).    Social History   Tobacco Use  Smoking Status Never Smoker  Smokeless Tobacco Never Used    Goals Met:  Independence with exercise equipment Exercise tolerated well Personal goals reviewed No report of cardiac concerns or symptoms Strength training completed today  Goals Unmet:  Not Applicable  Comments: Check out: 0915   Dr. Kate Sable is Medical Director for [place] and Pulmonary Rehab.

## 2020-01-20 ENCOUNTER — Encounter (HOSPITAL_COMMUNITY)
Admission: RE | Admit: 2020-01-20 | Discharge: 2020-01-20 | Disposition: A | Discharge status: Home / Self Care | Payer: Managed Care, Other (non HMO) | Source: Ambulatory Visit | Attending: Cardiology | Admitting: Cardiology

## 2020-01-20 ENCOUNTER — Other Ambulatory Visit: Payer: Self-pay

## 2020-01-20 DIAGNOSIS — Z955 Presence of coronary angioplasty implant and graft: Secondary | ICD-10-CM

## 2020-01-20 DIAGNOSIS — I214 Non-ST elevation (NSTEMI) myocardial infarction: Secondary | ICD-10-CM | POA: Diagnosis not present

## 2020-01-20 NOTE — Progress Notes (Signed)
Daily Session Note  Patient Details  Name: Alfred Clay MRN: [id number] Date of Birth: Mar 23, 1951 Referring Provider:     CARDIAC REHAB PHASE II ORIENTATION from 12/19/2019 in [place]  Referring Provider  Dr. Marlou Porch      Encounter Date: 01/20/2020  Check In: Session Check In - 01/20/20 0815      Check-In   Supervising physician immediately available to respond to emergencies  See telemetry face sheet for immediately available MD    Location  [facility]    Staff Present  Russella Dar, MS, EP, [facility], Exercise Physiologist;Neftali Abair Wynetta Emery, RN, BSN    Virtual Visit  No    Medication changes reported      No    Fall or balance concerns reported     No    Tobacco Cessation  No Change    Warm-up and Cool-down  Performed as group-led instruction    Resistance Training Performed  Yes    VAD Patient?  No      Pain Assessment   Currently in Pain?  No/denies    Pain Score  0-No pain    Multiple Pain Sites  No       Capillary Blood Glucose: No results found for this or any previous visit (from the past 24 hour(s)).    Social History   Tobacco Use  Smoking Status Never Smoker  Smokeless Tobacco Never Used    Goals Met:  Independence with exercise equipment Exercise tolerated well No report of cardiac concerns or symptoms Strength training completed today  Goals Unmet:  Not Applicable  Comments: Check out 915.   Dr. Kate Sable is Medical Director for [facility] Cardiac and Pulmonary Rehab.

## 2020-01-22 ENCOUNTER — Other Ambulatory Visit: Payer: Self-pay

## 2020-01-22 ENCOUNTER — Encounter (HOSPITAL_COMMUNITY)
Admission: RE | Admit: 2020-01-22 | Discharge: 2020-01-22 | Disposition: A | Discharge status: Home / Self Care | Payer: Managed Care, Other (non HMO) | Source: Ambulatory Visit | Attending: Cardiology | Admitting: Cardiology

## 2020-01-22 DIAGNOSIS — Z955 Presence of coronary angioplasty implant and graft: Secondary | ICD-10-CM

## 2020-01-22 DIAGNOSIS — I214 Non-ST elevation (NSTEMI) myocardial infarction: Secondary | ICD-10-CM | POA: Diagnosis not present

## 2020-01-22 NOTE — Progress Notes (Signed)
Daily Session Note  Patient Details  Name: Alfred Clay MRN: [id number] Date of Birth: Oct 14, 1950 Referring Provider:     CARDIAC REHAB PHASE II ORIENTATION from 12/19/2019 in [place]  Referring Provider  Dr. Marlou Porch      Encounter Date: 01/22/2020  Check In: Session Check In - 01/22/20 0805      Check-In   Supervising physician immediately available to respond to emergencies  See telemetry face sheet for immediately available MD    Location  [facility]    Staff Present  Russella Dar, MS, EP, [facility], Exercise Physiologist;Evan Mackie Wynetta Emery, RN, BSN    Virtual Visit  No    Medication changes reported      No    Fall or balance concerns reported     No    Tobacco Cessation  No Change    Warm-up and Cool-down  Performed as group-led instruction    Resistance Training Performed  Yes    VAD Patient?  No    PAD/SET Patient?  No      Pain Assessment   Currently in Pain?  No/denies    Pain Score  0-No pain    Multiple Pain Sites  No       Capillary Blood Glucose: No results found for this or any previous visit (from the past 24 hour(s)).    Social History   Tobacco Use  Smoking Status Never Smoker  Smokeless Tobacco Never Used    Goals Met:  Independence with exercise equipment Exercise tolerated well No report of cardiac concerns or symptoms Strength training completed today  Goals Unmet:  Not Applicable  Comments: Check out 915.   Dr. Kate Sable is Medical Director for [facility] Cardiac and Pulmonary Rehab.

## 2020-01-24 ENCOUNTER — Encounter (HOSPITAL_COMMUNITY)
Admission: RE | Admit: 2020-01-24 | Discharge: 2020-01-24 | Disposition: A | Discharge status: Home / Self Care | Payer: Managed Care, Other (non HMO) | Source: Ambulatory Visit | Attending: Cardiology | Admitting: Cardiology

## 2020-01-24 ENCOUNTER — Other Ambulatory Visit: Payer: Self-pay

## 2020-01-24 DIAGNOSIS — I214 Non-ST elevation (NSTEMI) myocardial infarction: Secondary | ICD-10-CM

## 2020-01-24 DIAGNOSIS — Z955 Presence of coronary angioplasty implant and graft: Secondary | ICD-10-CM

## 2020-01-24 NOTE — Progress Notes (Signed)
Daily Session Note  Patient Details  Name: Alfred Clay MRN: [id number] Date of Birth: 1951/08/15 Referring Provider:     CARDIAC REHAB PHASE II ORIENTATION from 12/19/2019 in [place]  Referring Provider  Dr. Marlou Porch      Encounter Date: 01/24/2020  Check In: Session Check In - 01/24/20 6681      Check-In   Supervising physician immediately available to respond to emergencies  See telemetry face sheet for immediately available MD    Location  [facility]    Staff Present  Russella Dar, MS, EP, [facility], Exercise Physiologist;Debra Wynetta Emery, RN, BSN    Virtual Visit  No    Medication changes reported      No    Fall or balance concerns reported     No    Tobacco Cessation  No Change    Warm-up and Cool-down  Performed as group-led instruction    Resistance Training Performed  Yes    VAD Patient?  No    PAD/SET Patient?  No      Pain Assessment   Currently in Pain?  No/denies    Pain Score  0-No pain    Multiple Pain Sites  No       Capillary Blood Glucose: No results found for this or any previous visit (from the past 24 hour(s)).    Social History   Tobacco Use  Smoking Status Never Smoker  Smokeless Tobacco Never Used    Goals Met:  Independence with exercise equipment Exercise tolerated well Personal goals reviewed No report of cardiac concerns or symptoms Strength training completed today  Goals Unmet:  Not Applicable  Comments: Check out: 0915   Dr. Kate Sable is Medical Director for [place] and Pulmonary Rehab.

## 2020-01-27 ENCOUNTER — Other Ambulatory Visit: Payer: Self-pay | Admitting: Family Medicine

## 2020-01-27 ENCOUNTER — Encounter (HOSPITAL_COMMUNITY): Payer: Managed Care, Other (non HMO)

## 2020-01-27 ENCOUNTER — Other Ambulatory Visit: Payer: Self-pay

## 2020-01-27 ENCOUNTER — Telehealth: Payer: Self-pay | Admitting: Family Medicine

## 2020-01-27 MED ORDER — ATORVASTATIN CALCIUM 80 MG PO TABS: 80.0000 mg | ORAL_TABLET | Freq: Every day | ORAL | 3 refills | Status: DC

## 2020-01-27 MED ORDER — PANTOPRAZOLE SODIUM 40 MG PO TBEC: 40.0000 mg | DELAYED_RELEASE_TABLET | Freq: Two times a day (BID) | ORAL | 2 refills | Status: DC

## 2020-01-27 MED ORDER — METFORMIN HCL 500 MG PO TABS: 500.0000 mg | ORAL_TABLET | Freq: Two times a day (BID) | ORAL | 2 refills | Status: DC

## 2020-01-27 NOTE — Telephone Encounter (Signed)
Medication: metFORMIN (GLUCOPHAGE) 500 MG tablet [id number]    Has the patient contacted their pharmacy? No. (If no, request that the patient contact the pharmacy for the refill.) (If yes, when and what did the pharmacy advise?)  Preferred Pharmacy (with phone number or street name):  [facility]  [address] [facility], [place], [place] [address]  ~19.6 mi [phone]  Agent: Please be advised that RX refills may take up to 3 business days. We ask that you follow-up with your pharmacy.

## 2020-01-27 NOTE — Telephone Encounter (Signed)
Sent in

## 2020-01-29 ENCOUNTER — Encounter (HOSPITAL_COMMUNITY): Payer: Managed Care, Other (non HMO)

## 2020-01-31 ENCOUNTER — Encounter (HOSPITAL_COMMUNITY): Payer: Managed Care, Other (non HMO)

## 2020-02-03 ENCOUNTER — Encounter (HOSPITAL_COMMUNITY): Payer: Managed Care, Other (non HMO)

## 2020-02-03 NOTE — Progress Notes (Addendum)
Cardiac Individual Treatment Plan  Patient Details  Name: Alfred Clay MRN: [id number] Date of Birth: 1951-04-08 Referring Provider:     CARDIAC REHAB PHASE II ORIENTATION from 12/19/2019 in [place]  Referring Provider  Dr. Marlou Porch      Initial Encounter Date:    CARDIAC REHAB PHASE II ORIENTATION from 12/19/2019 in [place]  Date  12/19/19      Visit Diagnosis: NSTEMI (non-ST elevated myocardial infarction) [facility])  Status post coronary artery stent placement  Patient's Home Medications on Admission:  Current Outpatient Medications:  .  albuterol (VENTOLIN HFA) 108 (90 Base) MCG/ACT inhaler, Inhale 2 puffs into the lungs every 6 (six) hours as needed for wheezing or shortness of breath., Disp: 18 g, Rfl: 1 .  Apoaequorin (PREVAGEN EXTRA STRENGTH) 20 MG CAPS, Take 20 mg by mouth daily., Disp: , Rfl:  .  aspirin EC 81 MG tablet, Take 1 tablet (81 mg total) by mouth daily., Disp: 90 tablet, Rfl: 3 .  atorvastatin (LIPITOR) 80 MG tablet, Take 1 tablet (80 mg total) by mouth daily., Disp: 90 tablet, Rfl: 3 .  levocetirizine (XYZAL) 5 MG tablet, Take 1 tablet (5 mg total) by mouth every evening., Disp: 30 tablet, Rfl: 2 .  metFORMIN (GLUCOPHAGE) 500 MG tablet, Take 1 tablet (500 mg total) by mouth 2 (two) times daily with a meal., Disp: 180 tablet, Rfl: 2 .  metoprolol succinate (TOPROL XL) 25 MG 24 hr tablet, Take 0.5 tablets (12.5 mg total) by mouth daily., Disp: 30 tablet, Rfl: 6 .  nitroGLYCERIN (NITROSTAT) 0.4 MG SL tablet, Place 1 tablet (0.4 mg total) under the tongue every 5 (five) minutes as needed., Disp: 25 tablet, Rfl: 12 .  pantoprazole (PROTONIX) 40 MG tablet, Take 1 tablet (40 mg total) by mouth 2 (two) times daily., Disp: 180 tablet, Rfl: 2 .  sucralfate (CARAFATE) 1 GM/10ML suspension, Take 10 mLs (1 g total) by mouth 2 (two) times daily., Disp: 420 mL, Rfl: 1 .  ticagrelor (BRILINTA) 90 MG TABS tablet, Take 1 tablet (90  mg total) by mouth 2 (two) times daily., Disp: 60 tablet, Rfl: 11  Past Medical History: Past Medical History:  Diagnosis Date  . Acquired esophageal ring   . Cancer ([place])    skin- upper back and lower back  . Cataract    bilateral-removed  . DM type 2 (diabetes mellitus, type 2) ([place])   . GERD (gastroesophageal reflux disease)   . History of esophagitis   . Hyperlipidemia    no meds  . Hypertension    no meds    Tobacco Use: Social History   Tobacco Use  Smoking Status Never Smoker  Smokeless Tobacco Never Used    Labs: Recent Review Scientist, physiological    Labs for ITP Cardiac and Pulmonary Rehab Latest Ref Rng & Units 10/16/2019 12/02/2019   Cholestrol 0 - 200 mg/dL 212(H) 137   LDLCALC 0 - 99 mg/dL 143(H) 74   HDL >39.00 mg/dL 57.20 50.20   Trlycerides 0.0 - 149.0 mg/dL 59.0 64.0   Hemoglobin A1c 4.6 - 6.5 % 6.5 -      Capillary Blood Glucose: Lab Results  Component Value Date   GLUCAP 112 (H) 11/08/2019   GLUCAP 100 (H) 08/06/2019     Exercise Target Goals: Exercise Program Goal: Individual exercise prescription set using results from initial 6 min walk test and THRR while considering  patient's activity barriers and safety.   Exercise Prescription Goal: Starting  with aerobic activity 30 plus minutes a day, 3 days per week for initial exercise prescription. Provide home exercise prescription and guidelines that participant acknowledges understanding prior to discharge.  Activity Barriers & Risk Stratification: Activity Barriers & Cardiac Risk Stratification - 12/19/19 1136      Activity Barriers & Cardiac Risk Stratification   Activity Barriers  --   (L) knee is weak   Cardiac Risk Stratification  High       6 Minute Walk: 6 Minute Walk    Row Name 12/19/19 1132         6 Minute Walk   Phase  Initial     Distance  1300 feet     Walk Time  6 minutes     # of Rest Breaks  0     MPH  2.46     METS  2.88     RPE  11     Perceived Dyspnea   11      VO2 Peak  12.55     Symptoms  No     Resting HR  73 bpm     Resting BP  102/70     Resting Oxygen Saturation   100 %     Exercise Oxygen Saturation  during 6 min walk  93 %     Max Ex. HR  115 bpm     Max Ex. BP  140/82     2 Minute Post BP  110/70        Oxygen Initial Assessment:   Oxygen Re-Evaluation:   Oxygen Discharge (Final Oxygen Re-Evaluation):   Initial Exercise Prescription: Initial Exercise Prescription - 12/19/19 1100      Date of Initial Exercise RX and Referring Provider   Date  12/19/19    Referring Provider  Dr. Marlou Porch    Expected Discharge Date  03/20/20      Treadmill   MPH  2    Grade  0    Minutes  17    METs  2.53      Prescription Details   Frequency (times per week)  3    Duration  Progress to 30 minutes of continuous aerobic without signs/symptoms of physical distress      Intensity   THRR 40-80% of Max Heartrate  104-120-136    Ratings of Perceived Exertion  11-15    Perceived Dyspnea  0-4      Progression   Progression  Continue to follow PAD protocol      Resistance Training   Training Prescription  Yes    Weight  1    Reps  10-15       Perform Capillary Blood Glucose checks as needed.  Exercise Prescription Changes:  Exercise Prescription Changes    Row Name 01/10/20 1200             Response to Exercise   Blood Pressure (Admit)  110/60       Blood Pressure (Exercise)  152/80       Blood Pressure (Exit)  118/60       Heart Rate (Admit)  61 bpm       Heart Rate (Exercise)  82 bpm       Heart Rate (Exit)  73 bpm       Rating of Perceived Exertion (Exercise)  11       Duration  Continue with 30 min of aerobic exercise without signs/symptoms of physical distress.       Intensity  THRR unchanged         Progression   Progression  Continue to progress workloads to maintain intensity without signs/symptoms of physical distress.       Average METs  3.06         Resistance Training   Training Prescription  Yes        Weight  2       Reps  10-15       Time  10 Minutes         Treadmill   MPH  2.3       Grade  0       Minutes  17       METs  2.76         Recumbant Elliptical   Level  1       RPM  25       Watts  66       Minutes  22       METs  3.6         Home Exercise Plan   Plans to continue exercise at  Home (comment)       Frequency  Add 2 additional days to program exercise sessions.       Initial Home Exercises Provided  12/19/19          Exercise Comments:  Exercise Comments    Row Name 12/19/19 1144 01/31/20 1611         Exercise Comments  Patient is eager to get started. This was his assessment/orientation visit. We will progress as tolerated.  Will not be able to do 2 week progression due to patient stopping because of insurance. He is working that all out and plans to return once that is settled.         Exercise Goals and Review:  Exercise Goals    Row Name 12/19/19 1142             Exercise Goals   Increase Physical Activity  Yes       Intervention  Provide advice, education, support and counseling about physical activity/exercise needs.;Develop an individualized exercise prescription for aerobic and resistive training based on initial evaluation findings, risk stratification, comorbidities and participant's personal goals.       Expected Outcomes  Short Term: Attend rehab on a regular basis to increase amount of physical activity.;Long Term: Add in home exercise to make exercise part of routine and to increase amount of physical activity.       Increase Strength and Stamina  Yes       Intervention  Provide advice, education, support and counseling about physical activity/exercise needs.;Develop an individualized exercise prescription for aerobic and resistive training based on initial evaluation findings, risk stratification, comorbidities and participant's personal goals.       Expected Outcomes  Short Term: Perform resistance training exercises routinely during  rehab and add in resistance training at home;Long Term: Improve cardiorespiratory fitness, muscular endurance and strength as measured by increased METs and functional capacity (6MWT)       Able to understand and use rate of perceived exertion (RPE) scale  Yes       Intervention  Provide education and explanation on how to use RPE scale       Expected Outcomes  Short Term: Able to use RPE daily in rehab to express subjective intensity level;Long Term:  Able to use RPE to guide intensity level when exercising independently  Knowledge and understanding of Target Heart Rate Range (THRR)  Yes       Intervention  Provide education and explanation of THRR including how the numbers were predicted and where they are located for reference       Expected Outcomes  Short Term: Able to use daily as guideline for intensity in rehab;Long Term: Able to use THRR to govern intensity when exercising independently       Able to check pulse independently  Yes       Intervention  Provide education and demonstration on how to check pulse in carotid and radial arteries.;Review the importance of being able to check your own pulse for safety during independent exercise       Expected Outcomes  Short Term: Able to explain why pulse checking is important during independent exercise;Long Term: Able to check pulse independently and accurately       Understanding of Exercise Prescription  Yes       Intervention  Provide education, explanation, and written materials on patient's individual exercise prescription       Expected Outcomes  Short Term: Able to explain program exercise prescription;Long Term: Able to explain home exercise prescription to exercise independently          Exercise Goals Re-Evaluation :    Discharge Exercise Prescription (Final Exercise Prescription Changes): Exercise Prescription Changes - 01/10/20 1200      Response to Exercise   Blood Pressure (Admit)  110/60    Blood Pressure (Exercise)   152/80    Blood Pressure (Exit)  118/60    Heart Rate (Admit)  61 bpm    Heart Rate (Exercise)  82 bpm    Heart Rate (Exit)  73 bpm    Rating of Perceived Exertion (Exercise)  11    Duration  Continue with 30 min of aerobic exercise without signs/symptoms of physical distress.    Intensity  THRR unchanged      Progression   Progression  Continue to progress workloads to maintain intensity without signs/symptoms of physical distress.    Average METs  3.06      Resistance Training   Training Prescription  Yes    Weight  2    Reps  10-15    Time  10 Minutes      Treadmill   MPH  2.3    Grade  0    Minutes  17    METs  2.76      Recumbant Elliptical   Level  1    RPM  25    Watts  66    Minutes  22    METs  3.6      Home Exercise Plan   Plans to continue exercise at  Home (comment)    Frequency  Add 2 additional days to program exercise sessions.    Initial Home Exercises Provided  12/19/19       Nutrition:  Target Goals: Understanding of nutrition guidelines, daily intake of sodium 1500mg , cholesterol 200mg , calories 30% from fat and 7% or less from saturated fats, daily to have 5 or more servings of fruits and vegetables.  Biometrics: Pre Biometrics - 12/19/19 1145      Pre Biometrics   Height  5' 10.5" (1.791 m)    Weight  73.3 kg    Waist Circumference  31 inches    Hip Circumference  36 inches    Waist to Hip Ratio  0.86 %    BMI (Calculated)  22.87    Triceps Skinfold  3 mm    % Body Fat  29 %    Grip Strength  35.6 kg    Flexibility  9.8 in    Single Leg Stand  60 seconds        Nutrition Therapy Plan and Nutrition Goals: Nutrition Therapy & Goals - 02/03/20 1536      Personal Nutrition Goals   Comments  We continue to work with our RD to schedule classes. In the interim, we are providing education through hand-outs. Patient says he has not changed his diet since he started the program but continues to eat heart healthy.      Intervention Plan    Intervention  Nutrition handout(s) given to patient.       Nutrition Assessments: Nutrition Assessments - 12/19/19 1153      MEDFICTS Scores   Pre Score  9   Patient has not gotten his appetite back fully so he is eating very little.      Nutrition Goals Re-Evaluation:   Nutrition Goals Discharge (Final Nutrition Goals Re-Evaluation):   Psychosocial: Target Goals: Acknowledge presence or absence of significant depression and/or stress, maximize coping skills, provide positive support system. Participant is able to verbalize types and ability to use techniques and skills needed for reducing stress and depression.  Initial Review & Psychosocial Screening: Initial Psych Review & Screening - 12/19/19 1149      Initial Review   Current issues with  None Identified      Family Dynamics   Good Support System?  Yes      Barriers   Psychosocial barriers to participate in program  There are no identifiable barriers or psychosocial needs.      Screening Interventions   Interventions  Encouraged to exercise    Expected Outcomes  Short Term goal: Identification and review with participant of any Quality of Life or Depression concerns found by scoring the questionnaire.;Long Term goal: The participant improves quality of Life and PHQ9 Scores as seen by post scores and/or verbalization of changes       Quality of Life Scores: Quality of Life - 12/19/19 1004      Quality of Life   Select  Quality of Life      Quality of Life Scores   Health/Function Pre  25.9 %    Socioeconomic Pre  29.29 %    Psych/Spiritual Pre  28.14 %    Family Pre  28.8 %    GLOBAL Pre  27.49 %      Scores of 19 and below usually indicate a poorer quality of life in these areas.  A difference of  2-3 points is a clinically meaningful difference.  A difference of 2-3 points in the total score of the Quality of Life Index has been associated with significant improvement in overall quality of life,  self-image, physical symptoms, and general health in studies assessing change in quality of life.  PHQ-9: Recent Review Flowsheet Data    Depression screen [facility] 2/9 12/19/2019   Decreased Interest 0   Down, Depressed, Hopeless 0   PHQ - 2 Score 0   Altered sleeping 0   Tired, decreased energy 0   Change in appetite 1   Feeling bad or failure about yourself  0   Trouble concentrating 0   Moving slowly or fidgety/restless 0   Suicidal thoughts 0   PHQ-9 Score 1   Difficult doing work/chores Not difficult at all  Interpretation of Total Score  Total Score Depression Severity:  1-4 = Minimal depression, 5-9 = Mild depression, 10-14 = Moderate depression, 15-19 = Moderately severe depression, 20-27 = Severe depression   Psychosocial Evaluation and Intervention: Psychosocial Evaluation - 12/19/19 1151      Psychosocial Evaluation & Interventions   Interventions  Encouraged to exercise with the program and follow exercise prescription    Continue Psychosocial Services   No Follow up required       Psychosocial Re-Evaluation: Psychosocial Re-Evaluation    [place] Name 01/13/20 1516 02/03/20 1537           Psychosocial Re-Evaluation   Current issues with  None Identified  None Identified      Comments  Patient's initial QOL score was 27.49 and his PHQ-9 score was 1 with no psychosocial issues identified. Will continue to monitor.  Patient's initial QOL score was 27.49 and his PHQ-9 score was 1 with no psychosocial issues identified. Will continue to monitor.      Expected Outcomes  Patient will have no psychosocial issues identified at discharge.  Patient will have no psychosocial issues identified at discharge.      Interventions  Stress management education;Encouraged to attend Cardiac Rehabilitation for the exercise;Relaxation education  Stress management education;Encouraged to attend Cardiac Rehabilitation for the exercise;Relaxation education      Continue Psychosocial Services    No Follow up required  No Follow up required         Psychosocial Discharge (Final Psychosocial Re-Evaluation): Psychosocial Re-Evaluation - 02/03/20 1537      Psychosocial Re-Evaluation   Current issues with  None Identified    Comments  Patient's initial QOL score was 27.49 and his PHQ-9 score was 1 with no psychosocial issues identified. Will continue to monitor.    Expected Outcomes  Patient will have no psychosocial issues identified at discharge.    Interventions  Stress management education;Encouraged to attend Cardiac Rehabilitation for the exercise;Relaxation education    Continue Psychosocial Services   No Follow up required       Vocational Rehabilitation: Provide vocational rehab assistance to qualifying candidates.   Vocational Rehab Evaluation & Intervention: Vocational Rehab - 12/19/19 1156      Initial Vocational Rehab Evaluation & Intervention   Assessment shows need for Vocational Rehabilitation  No       Education: Education Goals: Education classes will be provided on a weekly basis, covering required topics. Participant will state understanding/return demonstration of topics presented.  Learning Barriers/Preferences: Learning Barriers/Preferences - 12/19/19 1155      Learning Barriers/Preferences   Learning Barriers  None    Learning Preferences  Individual Instruction;Group Instruction;Skilled Demonstration       Education Topics: Hypertension, Hypertension Reduction -Define heart disease and high blood pressure. Discus how high blood pressure affects the body and ways to reduce high blood pressure.   Exercise and Your Heart -Discuss why it is important to exercise, the FITT principles of exercise, normal and abnormal responses to exercise, and how to exercise safely.   CARDIAC REHAB PHASE II EXERCISE from 01/22/2020 in [place]  Date  01/08/20  Educator  DC  Instruction Review Code  2- Demonstrated Understanding       Angina -Discuss definition of angina, causes of angina, treatment of angina, and how to decrease risk of having angina.   Cardiac Medications -Review what the following cardiac medications are used for, how they affect the body, and side effects that may occur when taking the  medications.  Medications include Aspirin, Beta blockers, calcium channel blockers, ACE Inhibitors, angiotensin receptor blockers, diuretics, digoxin, and antihyperlipidemics.   CARDIAC REHAB PHASE II EXERCISE from 01/22/2020 in [place]  Date  01/22/20  Educator  Etheleen Mayhew  Instruction Review Code  2- Demonstrated Understanding      Congestive Heart Failure -Discuss the definition of CHF, how to live with CHF, the signs and symptoms of CHF, and how keep track of weight and sodium intake.   Heart Disease and Intimacy -Discus the effect sexual activity has on the heart, how changes occur during intimacy as we age, and safety during sexual activity.   Smoking Cessation / COPD -Discuss different methods to quit smoking, the health benefits of quitting smoking, and the definition of COPD.   Nutrition I: Fats -Discuss the types of cholesterol, what cholesterol does to the heart, and how cholesterol levels can be controlled.   Nutrition II: Labels -Discuss the different components of food labels and how to read food label   Heart Parts/Heart Disease and PAD -Discuss the anatomy of the heart, the pathway of blood circulation through the heart, and these are affected by heart disease.   Stress I: Signs and Symptoms -Discuss the causes of stress, how stress may lead to anxiety and depression, and ways to limit stress.   Stress II: Relaxation -Discuss different types of relaxation techniques to limit stress.   Warning Signs of Stroke / TIA -Discuss definition of a stroke, what the signs and symptoms are of a stroke, and how to identify when someone is having stroke.   CARDIAC  REHAB PHASE II EXERCISE from 01/22/2020 in [place]  Date  12/25/19  Educator  Etheleen Mayhew  Instruction Review Code  2- Demonstrated Understanding      Knowledge Questionnaire Score: Knowledge Questionnaire Score - 12/19/19 1156      Knowledge Questionnaire Score   Pre Score  23/24       Core Components/Risk Factors/Patient Goals at Admission: Personal Goals and Risk Factors at Admission - 12/19/19 1157      Core Components/Risk Factors/Patient Goals on Admission    Weight Management  Weight Maintenance    Personal Goal Other  Yes    Personal Goal  Get heart built back up. Be as healthyas he can be.    Intervention  Atten CR 3 x week and to supplement with his home exercise plan 2 x week.    Expected Outcomes  Reach his personal goals.       Core Components/Risk Factors/Patient Goals Review:  Goals and Risk Factor Review    Row Name 01/13/20 1512 02/03/20 1531           Core Components/Risk Factors/Patient Goals Review   Personal Goals Review  -- Get heart built back up; be as healthy as I can be.  Other Get heart built back up; be as healthy as he can be.      Review  Patient has completed 8 sessions losing 6.6 lbs since last 30 day review. He is doing well in the program with some progression. He says he feels better and more relaxed since he started the program. Will continue to monitor for progress.  Patient has completed 12 sessions losing 3.4 lbs since last 30 day review. He has not attended since 01/24/20 due to his insurance changing due to retiring. He is working to get coverage. He is doing well in the program with progression. He says he does  feel stronger and is able to do more. Will continue to monitor for progress.      Expected Outcomes  Patient will continue to attend sessions and complete the program meeting his personal goals.  Patient will continue to attend sessions and complete the program meeting his personal goals.         Core  Components/Risk Factors/Patient Goals at Discharge (Final Review):  Goals and Risk Factor Review - 02/03/20 1531      Core Components/Risk Factors/Patient Goals Review   Personal Goals Review  Other   Get heart built back up; be as healthy as he can be.   Review  Patient has completed 12 sessions losing 3.4 lbs since last 30 day review. He has not attended since 01/24/20 due to his insurance changing due to retiring. He is working to get coverage. He is doing well in the program with progression. He says he does feel stronger and is able to do more. Will continue to monitor for progress.    Expected Outcomes  Patient will continue to attend sessions and complete the program meeting his personal goals.       ITP Comments: ITP Comments    Row Name 12/19/19 1119           ITP Comments  This is patients assessment/orientation visit. He did very well on walk test. He will be returning to work and doing the program.          Comments: ITP REVIEW Pt is making expected progress toward Cardiac Rehab goals after completing 12 sessions. Recommend continued exercise, life style modification, education, and increased stamina and strength.

## 2020-02-05 ENCOUNTER — Encounter (HOSPITAL_COMMUNITY): Payer: Managed Care, Other (non HMO)

## 2020-02-07 ENCOUNTER — Encounter (HOSPITAL_COMMUNITY): Payer: Managed Care, Other (non HMO)

## 2020-02-10 ENCOUNTER — Encounter (HOSPITAL_COMMUNITY): Payer: Managed Care, Other (non HMO)

## 2020-02-12 ENCOUNTER — Encounter (HOSPITAL_COMMUNITY): Payer: Managed Care, Other (non HMO)

## 2020-02-12 NOTE — Addendum Note (Signed)
Encounter addended by: Dwana Melena, RN on: 02/12/2020 2:53 PM  Actions taken: Flowsheet accepted, Clinical Note Signed

## 2020-02-12 NOTE — Progress Notes (Signed)
Discharge Progress Report  Patient Details  Name: Alfred Clay MRN: [id number] Date of Birth: 09-Jan-1951 Referring Provider:     CARDIAC REHAB PHASE II ORIENTATION from 12/19/2019 in [place]  Referring Provider  Dr. Marlou Porch       Number of Visits: 12  Reason for Discharge:  Early Exit:  Insurance  Smoking History:  Social History   Tobacco Use  Smoking Status Never Smoker  Smokeless Tobacco Never Used    Diagnosis:  NSTEMI (non-ST elevated myocardial infarction) (Early)  Status post coronary artery stent placement  ADL UCSD:   Initial Exercise Prescription: Initial Exercise Prescription - 12/19/19 1100      Date of Initial Exercise RX and Referring Provider   Date  12/19/19    Referring Provider  Dr. Marlou Porch    Expected Discharge Date  03/20/20      Treadmill   MPH  2    Grade  0    Minutes  17    METs  2.53      Prescription Details   Frequency (times per week)  3    Duration  Progress to 30 minutes of continuous aerobic without signs/symptoms of physical distress      Intensity   THRR 40-80% of Max Heartrate  104-120-136    Ratings of Perceived Exertion  11-15    Perceived Dyspnea  0-4      Progression   Progression  Continue to follow PAD protocol      Resistance Training   Training Prescription  Yes    Weight  1    Reps  10-15       Discharge Exercise Prescription (Final Exercise Prescription Changes): Exercise Prescription Changes - 01/10/20 1200      Response to Exercise   Blood Pressure (Admit)  110/60    Blood Pressure (Exercise)  152/80    Blood Pressure (Exit)  118/60    Heart Rate (Admit)  61 bpm    Heart Rate (Exercise)  82 bpm    Heart Rate (Exit)  73 bpm    Rating of Perceived Exertion (Exercise)  11    Duration  Continue with 30 min of aerobic exercise without signs/symptoms of physical distress.    Intensity  THRR unchanged      Progression   Progression  Continue to progress workloads to  maintain intensity without signs/symptoms of physical distress.    Average METs  3.06      Resistance Training   Training Prescription  Yes    Weight  2    Reps  10-15    Time  10 Minutes      Treadmill   MPH  2.3    Grade  0    Minutes  17    METs  2.76      Recumbant Elliptical   Level  1    RPM  25    Watts  66    Minutes  22    METs  3.6      Home Exercise Plan   Plans to continue exercise at  Home (comment)    Frequency  Add 2 additional days to program exercise sessions.    Initial Home Exercises Provided  12/19/19       Functional Capacity: 6 Minute Walk    Row Name 12/19/19 1132         6 Minute Walk   Phase  Initial     Distance  1300 feet  Walk Time  6 minutes     # of Rest Breaks  0     MPH  2.46     METS  2.88     RPE  11     Perceived Dyspnea   11     VO2 Peak  12.55     Symptoms  No     Resting HR  73 bpm     Resting BP  102/70     Resting Oxygen Saturation   100 %     Exercise Oxygen Saturation  during 6 min walk  93 %     Max Ex. HR  115 bpm     Max Ex. BP  140/82     2 Minute Post BP  110/70        Psychological, QOL, Others - Outcomes: PHQ 2/9: Depression screen PHQ 2/9 12/19/2019  Decreased Interest 0  Down, Depressed, Hopeless 0  PHQ - 2 Score 0  Altered sleeping 0  Tired, decreased energy 0  Change in appetite 1  Feeling bad or failure about yourself  0  Trouble concentrating 0  Moving slowly or fidgety/restless 0  Suicidal thoughts 0  PHQ-9 Score 1  Difficult doing work/chores Not difficult at all    Quality of Life: Quality of Life - 12/19/19 1004      Quality of Life   Select  Quality of Life      Quality of Life Scores   Health/Function Pre  25.9 %    Socioeconomic Pre  29.29 %    Psych/Spiritual Pre  28.14 %    Family Pre  28.8 %    GLOBAL Pre  27.49 %       Personal Goals: Goals established at orientation with interventions provided to work toward goal. Personal Goals and Risk Factors at Admission -  12/19/19 1157      Core Components/Risk Factors/Patient Goals on Admission    Weight Management  Weight Maintenance    Personal Goal Other  Yes    Personal Goal  Get heart built back up. Be as healthyas he can be.    Intervention  Atten CR 3 x week and to supplement with his home exercise plan 2 x week.    Expected Outcomes  Reach his personal goals.        Personal Goals Discharge: Goals and Risk Factor Review    Row Name 01/13/20 1512 02/03/20 1531           Core Components/Risk Factors/Patient Goals Review   Personal Goals Review  -- Get heart built back up; be as healthy as I can be.  Other Get heart built back up; be as healthy as he can be.      Review  Patient has completed 8 sessions losing 6.6 lbs since last 30 day review. He is doing well in the program with some progression. He says he feels better and more relaxed since he started the program. Will continue to monitor for progress.  Patient has completed 12 sessions losing 3.4 lbs since last 30 day review. He has not attended since 01/24/20 due to his insurance changing due to retiring. He is working to get coverage. He is doing well in the program with progression. He says he does feel stronger and is able to do more. Will continue to monitor for progress.      Expected Outcomes  Patient will continue to attend sessions and complete the program meeting his personal goals.  Patient will continue to attend sessions and complete the program meeting his personal goals.         Exercise Goals and Review: Exercise Goals    Row Name 12/19/19 1142             Exercise Goals   Increase Physical Activity  Yes       Intervention  Provide advice, education, support and counseling about physical activity/exercise needs.;Develop an individualized exercise prescription for aerobic and resistive training based on initial evaluation findings, risk stratification, comorbidities and participant's personal goals.       Expected Outcomes   Short Term: Attend rehab on a regular basis to increase amount of physical activity.;Long Term: Add in home exercise to make exercise part of routine and to increase amount of physical activity.       Increase Strength and Stamina  Yes       Intervention  Provide advice, education, support and counseling about physical activity/exercise needs.;Develop an individualized exercise prescription for aerobic and resistive training based on initial evaluation findings, risk stratification, comorbidities and participant's personal goals.       Expected Outcomes  Short Term: Perform resistance training exercises routinely during rehab and add in resistance training at home;Long Term: Improve cardiorespiratory fitness, muscular endurance and strength as measured by increased METs and functional capacity (6MWT)       Able to understand and use rate of perceived exertion (RPE) scale  Yes       Intervention  Provide education and explanation on how to use RPE scale       Expected Outcomes  Short Term: Able to use RPE daily in rehab to express subjective intensity level;Long Term:  Able to use RPE to guide intensity level when exercising independently       Knowledge and understanding of Target Heart Rate Range (THRR)  Yes       Intervention  Provide education and explanation of THRR including how the numbers were predicted and where they are located for reference       Expected Outcomes  Short Term: Able to use daily as guideline for intensity in rehab;Long Term: Able to use THRR to govern intensity when exercising independently       Able to check pulse independently  Yes       Intervention  Provide education and demonstration on how to check pulse in carotid and radial arteries.;Review the importance of being able to check your own pulse for safety during independent exercise       Expected Outcomes  Short Term: Able to explain why pulse checking is important during independent exercise;Long Term: Able to check  pulse independently and accurately       Understanding of Exercise Prescription  Yes       Intervention  Provide education, explanation, and written materials on patient's individual exercise prescription       Expected Outcomes  Short Term: Able to explain program exercise prescription;Long Term: Able to explain home exercise prescription to exercise independently          Exercise Goals Re-Evaluation:   Nutrition & Weight - Outcomes: Pre Biometrics - 12/19/19 1145      Pre Biometrics   Height  5' 10.5" (1.791 m)    Weight  73.3 kg    Waist Circumference  31 inches    Hip Circumference  36 inches    Waist to Hip Ratio  0.86 %    BMI (Calculated)  22.87  Triceps Skinfold  3 mm    % Body Fat  29 %    Grip Strength  35.6 kg    Flexibility  9.8 in    Single Leg Stand  60 seconds        Nutrition: Nutrition Therapy & Goals - 02/03/20 1536      Personal Nutrition Goals   Comments  We continue to work with our RD to schedule classes. In the interim, we are providing education through hand-outs. Patient says he has not changed his diet since he started the program but continues to eat heart healthy.      Intervention Plan   Intervention  Nutrition handout(s) given to patient.       Nutrition Discharge: Nutrition Assessments - 12/19/19 1153      MEDFICTS Scores   Pre Score  9   Patient has not gotten his appetite back fully so he is eating very little.      Education Questionnaire Score: Knowledge Questionnaire Score - 12/19/19 1156      Knowledge Questionnaire Score   Pre Score  23/24       Patient stopped coming to Cardiac Rehab on 01/24/20. He retired from his job and his insurance changed. He completed 12 sessions. Doctor will be informed.

## 2020-02-14 ENCOUNTER — Encounter (HOSPITAL_COMMUNITY): Payer: Managed Care, Other (non HMO)

## 2020-02-14 NOTE — Addendum Note (Signed)
Encounter addended by: Gean Maidens on: 02/14/2020 3:07 PM  Actions taken: Flowsheet accepted

## 2020-02-17 ENCOUNTER — Encounter (HOSPITAL_COMMUNITY): Payer: Managed Care, Other (non HMO)

## 2020-02-19 ENCOUNTER — Encounter (HOSPITAL_COMMUNITY): Payer: Managed Care, Other (non HMO)

## 2020-02-21 ENCOUNTER — Encounter (HOSPITAL_COMMUNITY): Payer: Managed Care, Other (non HMO)

## 2020-02-24 ENCOUNTER — Encounter (HOSPITAL_COMMUNITY): Payer: Managed Care, Other (non HMO)

## 2020-02-26 ENCOUNTER — Encounter (HOSPITAL_COMMUNITY): Payer: Managed Care, Other (non HMO)

## 2020-02-28 ENCOUNTER — Encounter (HOSPITAL_COMMUNITY): Payer: Managed Care, Other (non HMO)

## 2020-03-02 ENCOUNTER — Encounter (HOSPITAL_COMMUNITY): Payer: Managed Care, Other (non HMO)

## 2020-03-04 ENCOUNTER — Encounter (HOSPITAL_COMMUNITY): Payer: Managed Care, Other (non HMO)

## 2020-03-06 ENCOUNTER — Encounter (HOSPITAL_COMMUNITY): Payer: Managed Care, Other (non HMO)

## 2020-03-09 ENCOUNTER — Encounter (HOSPITAL_COMMUNITY): Payer: Managed Care, Other (non HMO)

## 2020-03-11 ENCOUNTER — Encounter (HOSPITAL_COMMUNITY): Payer: Managed Care, Other (non HMO)

## 2020-03-13 ENCOUNTER — Encounter (HOSPITAL_COMMUNITY): Payer: Managed Care, Other (non HMO)

## 2020-03-16 ENCOUNTER — Encounter (HOSPITAL_COMMUNITY): Payer: Managed Care, Other (non HMO)

## 2020-03-16 ENCOUNTER — Other Ambulatory Visit: Payer: Self-pay | Admitting: Family Medicine

## 2020-03-16 MED ORDER — METFORMIN HCL 500 MG PO TABS: 500.0000 mg | ORAL_TABLET | Freq: Two times a day (BID) | ORAL | 2 refills | Status: DC

## 2020-03-16 NOTE — Telephone Encounter (Signed)
Medication: metFORMIN (GLUCOPHAGE) 500 MG tablet     Has the patient contacted their pharmacy?  (If no, request that the patient contact the pharmacy for the refill.) (If yes, when and what did the pharmacy advise?)     Preferred Pharmacy (with phone number or street name): [place] [address] Phone [phone]      Agent: Please be advised that RX refills may take up to 3 business days. We ask that you follow-up with your pharmacy.

## 2020-03-16 NOTE — Telephone Encounter (Signed)
Patient wife notified that rx was sent in. °

## 2020-03-16 NOTE — Addendum Note (Signed)
Addended by: Kem Boroughs D on: 03/16/2020 01:39 PM   Modules accepted: Orders

## 2020-03-17 ENCOUNTER — Other Ambulatory Visit: Payer: Self-pay | Admitting: *Deleted

## 2020-03-17 MED ORDER — TICAGRELOR 90 MG PO TABS: 90.0000 mg | ORAL_TABLET | Freq: Two times a day (BID) | ORAL | 11 refills | Status: DC

## 2020-03-17 MED ORDER — METOPROLOL SUCCINATE ER 25 MG PO TB24: 12.5000 mg | ORAL_TABLET | Freq: Every day | ORAL | 11 refills | Status: DC

## 2020-03-17 MED ORDER — ATORVASTATIN CALCIUM 80 MG PO TABS: 80.0000 mg | ORAL_TABLET | Freq: Every day | ORAL | 3 refills | Status: DC

## 2020-03-19 ENCOUNTER — Other Ambulatory Visit: Payer: Self-pay

## 2020-03-19 MED ORDER — ATORVASTATIN CALCIUM 80 MG PO TABS: 80.0000 mg | ORAL_TABLET | Freq: Every day | ORAL | 3 refills | Status: DC

## 2020-03-19 MED ORDER — METOPROLOL SUCCINATE ER 25 MG PO TB24: 12.5000 mg | ORAL_TABLET | Freq: Every day | ORAL | 3 refills | Status: DC

## 2020-03-19 MED ORDER — TICAGRELOR 90 MG PO TABS: 90.0000 mg | ORAL_TABLET | Freq: Two times a day (BID) | ORAL | 3 refills | Status: DC

## 2020-03-24 ENCOUNTER — Telehealth: Payer: Self-pay

## 2020-03-24 ENCOUNTER — Other Ambulatory Visit: Payer: Self-pay | Admitting: Gastroenterology

## 2020-03-24 MED ORDER — PANTOPRAZOLE SODIUM 40 MG PO TBEC: 40.0000 mg | DELAYED_RELEASE_TABLET | Freq: Two times a day (BID) | ORAL | 2 refills | Status: DC

## 2020-03-24 NOTE — Telephone Encounter (Signed)
Sent medication to [facility].

## 2020-03-31 ENCOUNTER — Telehealth (INDEPENDENT_AMBULATORY_CARE_PROVIDER_SITE_OTHER): Payer: Medicare Other | Admitting: Family Medicine

## 2020-03-31 ENCOUNTER — Other Ambulatory Visit: Payer: Self-pay

## 2020-03-31 ENCOUNTER — Encounter: Payer: Self-pay | Admitting: Family Medicine

## 2020-03-31 DIAGNOSIS — R053 Chronic cough: Secondary | ICD-10-CM

## 2020-03-31 DIAGNOSIS — R05 Cough: Secondary | ICD-10-CM

## 2020-03-31 MED ORDER — FLUTICASONE PROPIONATE HFA 110 MCG/ACT IN AERO: 2.0000 | INHALATION_SPRAY | Freq: Two times a day (BID) | RESPIRATORY_TRACT | 2 refills | Status: DC

## 2020-03-31 NOTE — Progress Notes (Signed)
Chief Complaint  Patient presents with   Cough    Subjective: Patient is a 69 y.o. male here for f/u cough. Due to COVID-19 pandemic, we are interacting via web portal for an electronic face-to-face visit. I verified patient's ID using 2 identifiers. Patient agreed to proceed with visit via this method. Patient is at home, I am at office. Patient and I are present for visit.   Patient is following up for a cough that started in March of this year.  He has associated wheezing.  He feels there is drainage in his throat as well.  He took Xyzal that was not helpful.  Every time he used albuterol, the wheezing would improve and he felt the drainage would break apart.  It would last for around 4 hours before symptoms returned.  He is not having any shortness of breath, unexplained weight changes, coughing up blood, dietary associations, or other upper respiratory symptoms.  Of note, it has improved over the past several weeks but is still present.  Past Medical History:  Diagnosis Date   Acquired esophageal ring    Cancer ([place])    skin- upper back and lower back   Cataract    bilateral-removed   DM type 2 (diabetes mellitus, type 2) (HCC)    GERD (gastroesophageal reflux disease)    History of esophagitis    Hyperlipidemia    no meds   Hypertension    no meds    Objective: No conversational dyspnea Age appropriate judgment and insight Nml affect and mood  Assessment and Plan: Chronic cough - Plan: fluticasone (FLOVENT HFA) 110 MCG/ACT inhaler  Continue albuterol as needed.  Add Flovent twice daily should the wheezing and cough overall be severe enough.  He was told many times to rinse his mouth out after use, suggested to use before he brushes his teeth.  Stop Xyzal. Follow-up as needed for this. The patient voiced understanding and agreement to the plan.  [place], DO 03/31/20  8:13 AM

## 2020-07-06 ENCOUNTER — Encounter: Payer: Self-pay | Admitting: Cardiology

## 2020-07-06 ENCOUNTER — Other Ambulatory Visit: Payer: Self-pay

## 2020-07-06 ENCOUNTER — Ambulatory Visit (INDEPENDENT_AMBULATORY_CARE_PROVIDER_SITE_OTHER): Payer: Medicare Other | Admitting: Cardiology

## 2020-07-06 VITALS — BP 120/60 | HR 63 | Ht 70.5 in | Wt 170.6 lb

## 2020-07-06 DIAGNOSIS — I2510 Atherosclerotic heart disease of native coronary artery without angina pectoris: Secondary | ICD-10-CM

## 2020-07-06 DIAGNOSIS — Z955 Presence of coronary angioplasty implant and graft: Secondary | ICD-10-CM

## 2020-07-06 DIAGNOSIS — I255 Ischemic cardiomyopathy: Secondary | ICD-10-CM

## 2020-07-06 DIAGNOSIS — I259 Chronic ischemic heart disease, unspecified: Secondary | ICD-10-CM | POA: Diagnosis not present

## 2020-07-06 NOTE — Progress Notes (Signed)
Cardiology Office Note:    Date:  07/06/2020   ID:  Alfred Clay, DOB Aug 06, 1951, MRN [id number]  PCP:  Shelda Pal, DO  [facility] Cardiologist:  Candee Furbish, MD  [facility] Electrophysiologist:  None   Referring MD: Shelda Pal*    History of Present Illness:    Alfred Clay is a 69 y.o. male with ischemic cardiomyopathy EF previously 30 to 35% on repeat 60% echocardiogram 12/23/2019.  Previously wore LifeVest.  Had PCI DES to LAD.  Overall doing well without any fevers chills nausea vomiting syncope bleeding.  Does have some nuisance ecchymosis at times, bleeding with his dual antiplatelet therapy.  Past Medical History:  Diagnosis Date  . Acquired esophageal ring   . Cancer ([place])    skin- upper back and lower back  . Cataract    bilateral-removed  . DM type 2 (diabetes mellitus, type 2) ([place])   . GERD (gastroesophageal reflux disease)   . History of esophagitis   . Hyperlipidemia    no meds  . Hypertension    no meds    Past Surgical History:  Procedure Laterality Date  . BIOPSY  08/06/2019   Procedure: BIOPSY;  Surgeon: Lavena Bullion, DO;  Location: [facility];  Service: Gastroenterology;;  . CATARACT EXTRACTION Bilateral 10/2018  . COLONOSCOPY  2018  . CORONARY STENT INTERVENTION N/A 11/05/2019   Procedure: CORONARY STENT INTERVENTION;  Surgeon: Burnell Blanks, MD;  Location: [place] [facility];  Service: Cardiovascular;  Laterality: N/A;  . CORONARY/GRAFT ACUTE MI REVASCULARIZATION N/A 11/05/2019   Procedure: Coronary/Graft Acute MI Revascularization;  Surgeon: Burnell Blanks, MD;  Location: [place] [facility];  Service: Cardiovascular;  Laterality: N/A;  . ESOPHAGOGASTRODUODENOSCOPY  10/16/2018  . ESOPHAGOGASTRODUODENOSCOPY (EGD) WITH PROPOFOL N/A 08/06/2019   Procedure: ESOPHAGOGASTRODUODENOSCOPY (EGD) WITH PROPOFOL;  Surgeon: Lavena Bullion, DO;  Location: [facility];  Service:  Gastroenterology;  Laterality: N/A;  with dilation using ped scope  . HEMORRHOID SURGERY    . LEFT HEART CATH AND CORONARY ANGIOGRAPHY N/A 11/05/2019   Procedure: LEFT HEART CATH AND CORONARY ANGIOGRAPHY;  Surgeon: Burnell Blanks, MD;  Location: [place] [facility];  Service: Cardiovascular;  Laterality: N/A;  . SAVORY DILATION N/A 08/06/2019   Procedure: SAVORY DILATION;  Surgeon: Lavena Bullion, DO;  Location: [facility];  Service: Gastroenterology;  Laterality: N/A;  . SKIN CANCER EXCISION     back for skin cancer  . UPPER GASTROINTESTINAL ENDOSCOPY    . WISDOM TOOTH EXTRACTION      Current Medications: Current Meds  Medication Sig  . albuterol (VENTOLIN HFA) 108 (90 Base) MCG/ACT inhaler Inhale 2 puffs into the lungs every 6 (six) hours as needed for wheezing or shortness of breath.  . Apoaequorin (PREVAGEN EXTRA STRENGTH) 20 MG CAPS Take 20 mg by mouth daily.  Marland Kitchen aspirin EC 81 MG tablet Take 1 tablet (81 mg total) by mouth daily.  Marland Kitchen atorvastatin (LIPITOR) 80 MG tablet Take 1 tablet (80 mg total) by mouth daily.  . fluticasone (FLOVENT HFA) 110 MCG/ACT inhaler Inhale 2 puffs into the lungs in the morning and at bedtime.  . metFORMIN (GLUCOPHAGE) 500 MG tablet Take 1 tablet (500 mg total) by mouth 2 (two) times daily with a meal.  . metoprolol succinate (TOPROL XL) 25 MG 24 hr tablet Take 0.5 tablets (12.5 mg total) by mouth daily.  . nitroGLYCERIN (NITROSTAT) 0.4 MG SL tablet Place 1 tablet (0.4 mg total) under the tongue every  5 (five) minutes as needed.  . pantoprazole (PROTONIX) 40 MG tablet Take 1 tablet (40 mg total) by mouth 2 (two) times daily.  . sucralfate (CARAFATE) 1 GM/10ML suspension Take 10 mLs (1 g total) by mouth 2 (two) times daily.  . ticagrelor (BRILINTA) 90 MG TABS tablet Take 1 tablet (90 mg total) by mouth 2 (two) times daily.     Allergies:   Ace inhibitors   Social History   Socioeconomic History  . Marital status: Married    Spouse name: Not  on file  . Number of children: Not on file  . Years of education: Not on file  . Highest education level: Not on file  Occupational History  . Not on file  Tobacco Use  . Smoking status: Never Smoker  . Smokeless tobacco: Never Used  Vaping Use  . Vaping Use: Never used  Substance and Sexual Activity  . Alcohol use: Yes    Comment: ocassionally liquor  . Drug use: Not Currently  . Sexual activity: Not on file  Other Topics Concern  . Not on file  Social History Narrative  . Not on file   Social Determinants of Health   Financial Resource Strain:   . Difficulty of Paying Living Expenses: Not on file  Food Insecurity:   . Worried About Charity fundraiser in the Last Year: Not on file  . Ran Out of Food in the Last Year: Not on file  Transportation Needs:   . Lack of Transportation (Medical): Not on file  . Lack of Transportation (Non-Medical): Not on file  Physical Activity:   . Days of Exercise per Week: Not on file  . Minutes of Exercise per Session: Not on file  Stress:   . Feeling of Stress : Not on file  Social Connections:   . Frequency of Communication with Friends and Family: Not on file  . Frequency of Social Gatherings with Friends and Family: Not on file  . Attends Religious Services: Not on file  . Active Member of Clubs or Organizations: Not on file  . Attends Archivist Meetings: Not on file  . Marital Status: Not on file     Family History: The patient's family history includes Colon cancer in his mother; Colon polyps in his mother; Diabetes in his mother. There is no history of Esophageal cancer, Rectal cancer, or Stomach cancer.  ROS:   Please see the history of present illness.     All other systems reviewed and are negative.  EKGs/Labs/Other Studies Reviewed:    The following studies were reviewed today: Cardiac catheterization reviewed showed 99% mid LAD lesion successfully stented.    Recent Labs: 10/16/2019: ALT 9 11/20/2019:  BUN 17; Creatinine, Ser 0.99; Hemoglobin 12.9; Platelets 351; Potassium 4.2; Sodium 140  Recent Lipid Panel    Component Value Date/Time   CHOL 137 12/02/2019 0805   TRIG 64.0 12/02/2019 0805   HDL 50.20 12/02/2019 0805   CHOLHDL 3 12/02/2019 0805   VLDL 12.8 12/02/2019 0805   LDLCALC 74 12/02/2019 0805     Risk Assessment/Calculations:       Physical Exam:    VS:  BP 120/60   Pulse 63   Ht 5' 10.5" (1.791 m)   Wt 170 lb 9.6 oz (77.4 kg)   SpO2 96%   BMI 24.13 kg/m     Wt Readings from Last 3 Encounters:  07/06/20 170 lb 9.6 oz (77.4 kg)  01/01/20 168 lb (76.2 kg)  12/19/19 161 lb 11.2 oz (73.3 kg)     GEN:  Well nourished, well developed in no acute distress HEENT: Normal NECK: No JVD; No carotid bruits LYMPHATICS: No lymphadenopathy CARDIAC: RRR, no murmurs, rubs, gallops RESPIRATORY:  Clear to auscultation without rales, wheezing or rhonchi  ABDOMEN: Soft, non-tender, non-distended MUSCULOSKELETAL:  No edema; No deformity  SKIN: Warm and dry NEUROLOGIC:  Alert and oriented x 3 PSYCHIATRIC:  Normal affect   ASSESSMENT:    1. Ischemic cardiomyopathy   2. Coronary artery disease involving native coronary artery of native heart without angina pectoris   3. S/P coronary artery stent placement    PLAN:    In order of problems listed above:  Ischemic cardiomyopathy/coronary artery disease prior anterior STEMI -Angina well controlled currently no significant chest pain no shortness of breath.  On Toprol 25.  Has nitroglycerin as needed if needed.  He has been on dual antiplatelet therapy with Brilinta as well as aspirin we will continue for 1 year. -Thankfully, his EF has improved dramatically.  Now 60%.  We discontinued LifeVest at prior visit in April.  Allowed him to return to work at [employer] with no restrictions.  In fact, since our last visit he has retired.  Enjoying working in the shop, yard work.  Ischemic cardiomyopathy -Resolved, normal.  Post PCI LAD.   Continue with secondary risk factor prevention with Toprol.  On high intensity statin as well with Lipitor.  Labs in March showed LDL of 74 creatinine of 0.9.  ALT of 9.  No myalgias.  Doing well with current medical management.  Esophageal stricture -At prior visit we talked about giving him 6 months with dual antiplatelet therapy which began in February 2021 and then allowing to proceed with EGD and stricture procedure.  Optimally 12 months for 1 year dual antiplatelet therapy would be ideal.  He says it does not alter his lifestyle too much.  He does sometimes get pill stuck.  Able to eat chewed up food fairly well.  He has been dealing with this for about 3 years he states.   Medication Adjustments/Labs and Tests Ordered: Current medicines are reviewed at length with the patient today.  Concerns regarding medicines are outlined above.  No orders of the defined types were placed in this encounter.  No orders of the defined types were placed in this encounter.   Patient Instructions  Medication Instructions:  No changes *If you need a refill on your cardiac medications before your next appointment, please call your pharmacy*   Lab Work: none If you have labs (blood work) drawn today and your tests are completely normal, you will receive your results only by: Marland Kitchen MyChart Message (if you have MyChart) OR . A paper copy in the mail If you have any lab test that is abnormal or we need to change your treatment, we will call you to review the results.   Testing/Procedures: none   Follow-Up: At [facility], you and your health needs are our priority.  As part of our continuing mission to provide you with exceptional heart care, we have created designated Provider Care Teams.  These Care Teams include your primary Cardiologist (physician) and Advanced Practice Providers (APPs -  Physician Assistants and Nurse Practitioners) who all work together to provide you with the care you need, when  you need it.  We recommend signing up for the patient portal called "MyChart".  Sign up information is provided on this After Visit Summary.  MyChart is  used to connect with patients for Virtual Visits (Telemedicine).  Patients are able to view lab/test results, encounter notes, upcoming appointments, etc.  Non-urgent messages can be sent to your provider as well.   To learn more about what you can do with MyChart, go to [email or web address].    Your next appointment:   6 month(s)  The format for your next appointment:   In Person  Provider:   You may see Candee Furbish, MD or one of the following Advanced Practice Providers on your designated Care Team:    Truitt Merle, NP  Cecilie Kicks, NP  Kathyrn Drown, NP    Other Instructions      Signed, Candee Furbish, MD  07/06/2020 8:30 AM    [place]

## 2020-07-06 NOTE — Patient Instructions (Signed)
Medication Instructions:  No changes *If you need a refill on your cardiac medications before your next appointment, please call your pharmacy*   Lab Work: none If you have labs (blood work) drawn today and your tests are completely normal, you will receive your results only by: . MyChart Message (if you have MyChart) OR . A paper copy in the mail If you have any lab test that is abnormal or we need to change your treatment, we will call you to review the results.   Testing/Procedures: none   Follow-Up: At [facility], you and your health needs are our priority.  As part of our continuing mission to provide you with exceptional heart care, we have created designated Provider Care Teams.  These Care Teams include your primary Cardiologist (physician) and Advanced Practice Providers (APPs -  Physician Assistants and Nurse Practitioners) who all work together to provide you with the care you need, when you need it.  We recommend signing up for the patient portal called "MyChart".  Sign up information is provided on this After Visit Summary.  MyChart is used to connect with patients for Virtual Visits (Telemedicine).  Patients are able to view lab/test results, encounter notes, upcoming appointments, etc.  Non-urgent messages can be sent to your provider as well.   To learn more about what you can do with MyChart, go to [email or web address].    Your next appointment:   6 month(s)  The format for your next appointment:   In Person  Provider:   You may see Mark Skains, MD or one of the following Advanced Practice Providers on your designated Care Team:    Lori Gerhardt, NP  Laura Ingold, NP  Jill McDaniel, NP    Other Instructions   

## 2020-10-01 ENCOUNTER — Ambulatory Visit: Payer: Medicare Other | Attending: Internal Medicine

## 2020-10-01 DIAGNOSIS — Z23 Encounter for immunization: Secondary | ICD-10-CM

## 2020-10-01 NOTE — Progress Notes (Signed)
   Covid-19 Vaccination Clinic  Name:  Alfred Clay    MRN: [id number] DOB: 05-May-1951  10/01/2020  [name] was observed post Covid-19 immunization for 15 minutes without incident. He was provided with Vaccine Information Sheet and instruction to access the V-Safe system.   [name] was instructed to call 911 with any severe reactions post vaccine: Marland Kitchen Difficulty breathing  . Swelling of face and throat  . A fast heartbeat  . A bad rash all over body  . Dizziness and weakness   Immunizations Administered    Name Date Dose VIS Date Route   Moderna Covid-19 Booster Vaccine 10/01/2020  1:07 PM 0.25 mL 07/15/2020 Intramuscular   Manufacturer: Gala Murdoch   Lot: [id number]   NDC: 63785-885-02

## 2020-10-05 ENCOUNTER — Other Ambulatory Visit: Payer: Self-pay

## 2020-10-06 ENCOUNTER — Encounter: Payer: Self-pay | Admitting: Family Medicine

## 2020-10-06 ENCOUNTER — Ambulatory Visit (INDEPENDENT_AMBULATORY_CARE_PROVIDER_SITE_OTHER): Payer: Medicare Other | Admitting: Family Medicine

## 2020-10-06 VITALS — BP 108/68 | HR 72 | Temp 97.8°F | Ht 70.0 in | Wt 164.2 lb

## 2020-10-06 DIAGNOSIS — E1165 Type 2 diabetes mellitus with hyperglycemia: Secondary | ICD-10-CM | POA: Diagnosis not present

## 2020-10-06 DIAGNOSIS — Z23 Encounter for immunization: Secondary | ICD-10-CM | POA: Diagnosis not present

## 2020-10-06 DIAGNOSIS — Z85828 Personal history of other malignant neoplasm of skin: Secondary | ICD-10-CM

## 2020-10-06 LAB — LIPID PANEL
Cholesterol: 150 mg/dL (ref 0–200)
HDL: 56.5 mg/dL (ref >39.00)
LDL Cholesterol: 82 mg/dL (ref 0–99)
NonHDL: 93.69
Total CHOL/HDL Ratio: 3
Triglycerides: 56 mg/dL (ref 0.0–149.0)
VLDL: 11.2 mg/dL (ref 0.0–40.0)

## 2020-10-06 LAB — MICROALBUMIN / CREATININE URINE RATIO
Creatinine,U: 28.2 mg/dL
Microalb Creat Ratio: 2.5 mg/g (ref 0.0–30.0)
Microalb, Ur: <0.7 mg/dL (ref 0.0–1.9)

## 2020-10-06 LAB — COMPREHENSIVE METABOLIC PANEL
ALT: 35 U/L (ref 0–53)
AST: 26 U/L (ref 0–37)
Albumin: 4.4 g/dL (ref 3.5–5.2)
Alkaline Phosphatase: 85 U/L (ref 39–117)
BUN: 23 mg/dL (ref 6–23)
CO2: 29 mEq/L (ref 19–32)
Calcium: 9.8 mg/dL (ref 8.4–10.5)
Chloride: 99 mEq/L (ref 96–112)
Creatinine, Ser: 0.92 mg/dL (ref 0.40–1.50)
GFR: 84.85 mL/min (ref >60.00)
Glucose, Bld: 140 mg/dL — ABNORMAL HIGH (ref 70–99)
Potassium: 5.3 mEq/L — ABNORMAL HIGH (ref 3.5–5.1)
Sodium: 134 mEq/L — ABNORMAL LOW (ref 135–145)
Total Bilirubin: 1.2 mg/dL (ref 0.2–1.2)
Total Protein: 7 g/dL (ref 6.0–8.3)

## 2020-10-06 LAB — HEMOGLOBIN A1C: Hgb A1c MFr Bld: 7.1 % — ABNORMAL HIGH (ref 4.6–6.5)

## 2020-10-06 NOTE — Patient Instructions (Signed)
Give us 2-3 business days to get the results of your labs back.   Keep the diet clean and stay active.  Let us know if you need anything. 

## 2020-10-06 NOTE — Progress Notes (Signed)
Subjective:   Chief Complaint  Patient presents with  . Diabetes    Alfred Clay is a 70 y.o. male here for follow-up of diabetes.   [name]'s self monitored glucose range is low 100's.  Patient denies hypoglycemic reactions. He checks his glucose levels daily. Patient does not require insulin.   Medications include: metformin 500 mg bid Diet is healthy overall.  Exercise: walking No CP or SOB.   Hypertension Patient presents for hypertension follow up. He does monitor home blood pressures. Blood pressures ranging on average from 110's/70's. He is compliant with medication- Toprol XL 12.5 mg/d- not on ACEi as it would drop his BP too low. Patient has these side effects of medication: none Diet/exercise as above.   Past Medical History:  Diagnosis Date  . Acquired esophageal ring   . Cancer ([place])    skin- upper back and lower back  . Cataract    bilateral-removed  . DM type 2 (diabetes mellitus, type 2) ([place])   . GERD (gastroesophageal reflux disease)   . History of esophagitis   . Hyperlipidemia    no meds  . Hypertension    no meds     Related testing: Retinal exam: Done Pneumovax: Done  Objective:  BP 108/68 (BP Location: Left Arm, Patient Position: Sitting, Cuff Size: Normal)   Pulse 72   Temp 97.8 F (36.6 C) (Oral)   Ht 5\' 10"  (1.778 m)   Wt 164 lb 4 oz (74.5 kg)   SpO2 99%   BMI 23.57 kg/m  General:  Well developed, well nourished, in no apparent distress Skin:  Warm, no pallor or diaphoresis Head:  Normocephalic, atraumatic Eyes:  Pupils equal and round, sclera anicteric without injection  Lungs:  CTAB, no access msc use Cardio:  RRR, no bruits, no LE edema Musculoskeletal:  Symmetrical muscle groups noted without atrophy or deformity Neuro:  Sensation intact to pinprick on feet Psych: Age appropriate judgment and insight  Assessment:   Type 2 diabetes mellitus with hyperglycemia, without long-term current use of insulin (HCC) - Plan:  Comprehensive metabolic panel, Lipid panel, Hemoglobin A1c, Microalbumin / creatinine urine ratio  Need for influenza vaccination - Plan: Flu Vaccine QUAD High Dose(Fluad)   Plan:   1. Counseled on diet and exercise.  Continue metformin 500 mg twice daily.  Depending on his A1c, might be able to stop checking sugars. 2. continue current Toprol dosage.  He does have a history of a heart attack.  We will hold off on ACE inhibitor/ARB given his blood pressure. F/u in 6 months pending the above work-up.  The patient voiced understanding and agreement to the plan.  [place], DO 10/06/20 11:17 AM

## 2020-10-07 ENCOUNTER — Other Ambulatory Visit: Payer: Self-pay | Admitting: Family Medicine

## 2020-10-07 DIAGNOSIS — E875 Hyperkalemia: Secondary | ICD-10-CM

## 2020-10-07 DIAGNOSIS — E871 Hypo-osmolality and hyponatremia: Secondary | ICD-10-CM

## 2020-10-08 ENCOUNTER — Other Ambulatory Visit: Payer: Self-pay

## 2020-10-08 ENCOUNTER — Telehealth: Payer: Self-pay | Admitting: Physician Assistant

## 2020-10-08 NOTE — Telephone Encounter (Signed)
Referral from Dr. Blenda Nicely(?) Scheduled w/KRS 03/02/21 @10 

## 2020-10-09 ENCOUNTER — Other Ambulatory Visit (INDEPENDENT_AMBULATORY_CARE_PROVIDER_SITE_OTHER): Payer: Medicare Other

## 2020-10-09 DIAGNOSIS — E871 Hypo-osmolality and hyponatremia: Secondary | ICD-10-CM

## 2020-10-09 DIAGNOSIS — E875 Hyperkalemia: Secondary | ICD-10-CM

## 2020-10-09 LAB — BASIC METABOLIC PANEL
BUN: 24 mg/dL — ABNORMAL HIGH (ref 6–23)
CO2: 26 mEq/L (ref 19–32)
Calcium: 9.8 mg/dL (ref 8.4–10.5)
Chloride: 100 mEq/L (ref 96–112)
Creatinine, Ser: 1.1 mg/dL (ref 0.40–1.50)
GFR: 68.47 mL/min (ref >60.00)
Glucose, Bld: 108 mg/dL — ABNORMAL HIGH (ref 70–99)
Potassium: 4.8 mEq/L (ref 3.5–5.1)
Sodium: 136 mEq/L (ref 135–145)

## 2020-10-22 ENCOUNTER — Telehealth: Payer: Self-pay

## 2020-10-22 ENCOUNTER — Encounter: Payer: Self-pay | Admitting: Gastroenterology

## 2020-10-22 ENCOUNTER — Ambulatory Visit (INDEPENDENT_AMBULATORY_CARE_PROVIDER_SITE_OTHER): Payer: Medicare Other | Admitting: Gastroenterology

## 2020-10-22 VITALS — BP 110/68 | HR 71 | Ht 71.0 in | Wt 164.4 lb

## 2020-10-22 DIAGNOSIS — K222 Esophageal obstruction: Secondary | ICD-10-CM | POA: Diagnosis not present

## 2020-10-22 DIAGNOSIS — K449 Diaphragmatic hernia without obstruction or gangrene: Secondary | ICD-10-CM

## 2020-10-22 DIAGNOSIS — R634 Abnormal weight loss: Secondary | ICD-10-CM

## 2020-10-22 DIAGNOSIS — Z7902 Long term (current) use of antithrombotics/antiplatelets: Secondary | ICD-10-CM

## 2020-10-22 DIAGNOSIS — R1319 Other dysphagia: Secondary | ICD-10-CM

## 2020-10-22 NOTE — Telephone Encounter (Addendum)
Primary Cardiologist:Mark Marlou Porch, MD  Chart reviewed as part of pre-operative protocol coverage. Because of Alfred Clay past medical history and time since last visit, he/she will require a follow-up visit in order to better assess preoperative cardiovascular risk.  Pre-op covering staff: - Please schedule appointment and call patient to inform them. - Please contact requesting surgeon's office via preferred method (i.e, phone, fax) to inform them of need for appointment prior to surgery.  Patient received PCI with DES 12/23/2019.  He will not be eligible to hold antiplatelet therapy until after 12/22/2020.  If applicable, this message will also be routed to pharmacy pool and/or primary cardiologist for input on holding anticoagulant/antiplatelet agent as requested below so that this information is available at time of patient's appointment.   Deberah Pelton, NP  10/22/2020, 11:51 AM

## 2020-10-22 NOTE — Progress Notes (Signed)
P  Chief Complaint:    Dysphagia  GI History: 70 year old male with history of progressive dysphagia to solids over the last 2 years, without improvement with PPI, Carafate.  Recent EGDs with severe proximal esophageal stenosis and esophagitis.  Biopsies were concerning for lymphocytic esophagitis in 09/2018, and more recently suspicious for lichenoid esophagitis.   Endoscopic history: -EGD (06/2018, Dr. Alessandra Bevels): LA Grade C esophagitis (biopsy: Ulcerated mucosa with reflux changes), non-H. pylori gastritis. Normal duodenum with normal biopsies. -06/2017: Started PPI bid -EGD (09/2018, Dr. Alessandra Bevels): LA Grade C esophagitis, a few benign-appearing intrinsic moderate stenoses (biopsy: Increased lymphocytes concerning for lymphocytic esophagitis) -09/2018: ContinuedbidPPI, added Carafate with resolution of reflux symptoms - EGD (05/2019, Dr. Silverio Decamp): LA Grade C esophagitis 20-22 cm from incisors, severe stenosis at 22 cm 8 mm diameter, not traversable.  Recommended repeat at [facility] with pediatric endoscope -EGD (04/2019, Dr. Bryan Lemma): 2 stenoses located in proximal esophagus (18 cm from incisors) and GEJ (37 cm from incisors).  Dilated with 13 mm TTS balloon with mucosal rent.  Moderately severe esophagitis mucosal friability throughout esophagus.  Biopsies suggestive of lichenoid esophagitis.  No evidence of EOE or lymphocytic esophagitis.  3 cm HH.  Normal stomach and duodenum.  -EGD (07/2019, Dr. Bryan Lemma): LA Grade C esophagitis, 2-3 cm HH, pan esophageal narrowing with severe stenosis at 18 and 40 cm from incisors, dilated with 8,9,11 mm Savary dilator with appropriate mucosal rent. Biopsies negative for EoE, LyE -EGD (08/2019, Dr. Bryan Lemma): Narrowed esophagus with dominant stricture at 70 and 40 cm, dilated with 13 mm Savary. Esophagitis 24-40 cm, 2 cm HH. Bxs negative for LyE, EoE -EGD (09/2019, Dr. Bryan Lemma): Narrowed esophagus with dominant stricture x2 at 66 and 40 cm  dilated with 14 mm Savary. 2 cm HH.   -Previously followed in [place] with EGD approximately7/2019 (GERD, dysphagia, odynophagia) with Candida Esophagitismay have shown fungal infection and H. pylori per patient. No reports available for review. -Colonoscopy (approximately 02/2018, [place]): Normal per patient  HPI:     Patient is a 70 y.o. male presenting to the Gastroenterology Clinic for follow-up. Last seen in the office on 07/03/2019. Repeat EGD in 07/2019, 08/2019, and 09/2019 as above. States that his dysphagia had improved, but now starting to return. Gradual return of solid food dysphagia. Will occasionally regurgitate food back out. Continues to take Protonix 40 mg. No other issues.  Since last appointment, had STEMI in 10/2019 requiring PCI DES to LAD. Currently on Brilinta and ASA 81 mg. Followed up with Cardiology in 06/2020. EF dramatically improved on repeat echo to 60%. Plan at that time was for at least 6 months (optimally 12 months if possible) of dual antiplatelet therapy then okay to undergo EGD with dilation.  Review of systems:     No chest pain, no SOB, no fevers, no urinary sx   Past Medical History:  Diagnosis Date  . Acquired esophageal ring   . Cancer ([place])    skin- upper back and lower back  . Cataract    bilateral-removed  . DM type 2 (diabetes mellitus, type 2) ([place])   . GERD (gastroesophageal reflux disease)   . History of esophagitis   . Hyperlipidemia    no meds  . Hypertension    no meds    Patient's surgical history, family medical history, social history, medications and allergies were all reviewed in Epic    Current Outpatient Medications  Medication Sig Dispense Refill  . albuterol (VENTOLIN HFA) 108 (90 Base) MCG/ACT inhaler Inhale  2 puffs into the lungs every 6 (six) hours as needed for wheezing or shortness of breath. 18 g 1  . Apoaequorin (PREVAGEN EXTRA STRENGTH) 20 MG CAPS Take 20 mg by mouth daily.    Marland Kitchen aspirin EC 81 MG tablet Take  1 tablet (81 mg total) by mouth daily. 90 tablet 3  . atorvastatin (LIPITOR) 80 MG tablet Take 1 tablet (80 mg total) by mouth daily. 90 tablet 3  . fluticasone (FLOVENT HFA) 110 MCG/ACT inhaler Inhale 2 puffs into the lungs in the morning and at bedtime. 1 Inhaler 2  . metFORMIN (GLUCOPHAGE) 500 MG tablet Take 1 tablet (500 mg total) by mouth 2 (two) times daily with a meal. 180 tablet 2  . metoprolol succinate (TOPROL XL) 25 MG 24 hr tablet Take 0.5 tablets (12.5 mg total) by mouth daily. 45 tablet 3  . nitroGLYCERIN (NITROSTAT) 0.4 MG SL tablet Place 1 tablet (0.4 mg total) under the tongue every 5 (five) minutes as needed. 25 tablet 12  . pantoprazole (PROTONIX) 40 MG tablet Take 1 tablet (40 mg total) by mouth 2 (two) times daily. 180 tablet 2  . ticagrelor (BRILINTA) 90 MG TABS tablet Take 1 tablet (90 mg total) by mouth 2 (two) times daily. 180 tablet 3   No current facility-administered medications for this visit.    Physical Exam:     BP 110/68   Pulse 71   Ht 5\' 11"  (1.803 m)   Wt 164 lb 6 oz (74.6 kg)   BMI 22.93 kg/m   GENERAL:  Pleasant male in NAD PSYCH: : Cooperative, normal affect EENT:  conjunctiva pink, mucous membranes moist, neck supple without masses CARDIAC:  RRR, no murmur heard, no peripheral edema PULM: Normal respiratory effort, lungs CTA bilaterally, no wheezing ABDOMEN:  Nondistended, soft, nontender. No obvious masses, no hepatomegaly,  normal bowel sounds SKIN:  turgor, no lesions seen Musculoskeletal:  Normal muscle tone, normal strength NEURO: Alert and oriented x 3, no focal neurologic deficits   IMPRESSION and PLAN:    1) Dysphagia 2) Esophageal stricture 3) History of esophagitis  -Repeat EGD at [facility] with dilation and repeat esophageal biopsies to again rule out EOE, lymphocytic esophagitis. Discussed possibility of kenalog injection as well based on endoscopic appearance - Given need for pediatric endosocpe in the past, and given current  progressive symptoms, will perform at [facility] in the event that we need to convert to pediatric endoscope.   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 Low but real risk of cardiovascular event such as heart attack, stroke, embolism, thrombosis or ischemia/infarct of other organs off Brilinta explained and need to seek urgent help if this occurs. The patient consents to proceed. Will communicate by phone or EMR with patient's prescribing provider to confirm that holding Brilinta is reasonable in this case  6) Weight loss -Has lost a few pounds attributed to the above dysphagia  The indications, risks, and benefits of EGD were explained to the patient in detail. Risks include but are not limited to bleeding, perforation, adverse reaction to medications, and cardiopulmonary compromise. Sequelae include but are not limited to the possibility of surgery, hospitalization, and mortality. The patient verbalized understanding and wished to proceed. All questions answered, referred to scheduler. Further recommendations pending results of the exam.           Luanna Salk  V Unique Sillas ,DO, FACG 10/22/2020, 11:04 AM

## 2020-10-22 NOTE — Telephone Encounter (Signed)
Will update upcoming appointment note to include pre-op clearance.

## 2020-10-22 NOTE — Telephone Encounter (Signed)
Spoke  With Baxter Flattery CMA at [facility] who reports that patient is not eligible to hold his  Brilinta until 3/22 due to PCI with DES on 12/23/19. Patient has a follow up with Dr Marlou Porch on 01/04/21. He will provide is clearance to hold his blood thinner at that appointment.

## 2020-10-22 NOTE — Patient Instructions (Signed)
If you are age 70 or older, your body mass index should be between 23-30. Your Body mass index is 22.93 kg/m. If this is out of the aforementioned range listed, please consider follow up with your Primary Care Provider.  If you are age 43 or younger, your body mass index should be between 19-25. Your Body mass index is 22.93 kg/m. If this is out of the aformentioned range listed, please consider follow up with your Primary Care Provider.   You will be contacted with an appointment date and time for your EGD at [facility] will be contacted by our office prior to your procedure for directions on holding your Brilinta.  If you do not hear from our office 1 week prior to your scheduled procedure, please call [phone] to discuss.    It was a pleasure to see you today!  Vito Cirigliano, D.O.

## 2020-10-22 NOTE — Telephone Encounter (Signed)
[facility] Pre-operative Risk Assessment     Request for surgical clearance:     Endoscopy Procedure  What type of surgery is being performed?    EGD   When is this surgery scheduled?    Not yet scheduled  What type of clearance is required ?   Pharmacy  Are there any medications that need to be held prior to surgery and how long? [place] name and name of physician performing surgery?      [facility]  What is your office phone and fax number?      Phone- [phone]  [phone]  Anesthesia type (None, local, MAC, general) ?       MAC

## 2020-10-22 NOTE — Telephone Encounter (Addendum)
Called the requesting office of [facility] and spoke with Claiborne Billings. I informed her that the patient had received a PCI with DES on 12/23/19 and is not eligible to hold antiplatelet therapy until after 12/22/20. I also let her know patient is scheduled for an appointment on 01/04/21 with Dr. Marlou Porch and the clearance can be addressed at that appointment. She thanked me for calling.

## 2020-11-02 ENCOUNTER — Telehealth: Payer: Self-pay

## 2020-11-02 ENCOUNTER — Other Ambulatory Visit: Payer: Self-pay | Admitting: Gastroenterology

## 2020-11-02 DIAGNOSIS — R1319 Other dysphagia: Secondary | ICD-10-CM

## 2020-11-02 DIAGNOSIS — K222 Esophageal obstruction: Secondary | ICD-10-CM

## 2020-11-02 NOTE — Telephone Encounter (Signed)
[facility] Pre-operative Risk Assessment     Request for surgical clearance:     Endoscopy Procedure  What type of surgery is being performed?     EGD  When is this surgery scheduled?     12/07/20  What type of clearance is required ?   Pharmacy  Are there any medications that need to be held prior to surgery and how long? Brilinta 5 days  Practice name and name of physician performing surgery?      [facility]  What is your office phone and fax number?      Phone- [phone]  [phone]  Anesthesia type (None, local, MAC, general) ?       MAC

## 2020-11-02 NOTE — Telephone Encounter (Signed)
Yes okay to hold Brilinta for 5 days prior to procedure. Candee Furbish, MD

## 2020-11-02 NOTE — Telephone Encounter (Signed)
Patient notified to hold Brilinta for 5 days prior to 12/07/20 procedure. He voiced understanding.

## 2020-11-02 NOTE — Telephone Encounter (Signed)
   Primary Cardiologist: Candee Furbish, MD  Chart reviewed as part of pre-operative protocol coverage. We have been asked to provide guidance to hold brilinta prior to EGD.   Per Dr. Marlou Porch, [place] to hold brilinta for 5 days prior to procedure.   I will route this recommendation to the requesting party via Epic fax function and remove from pre-op pool. Please call with questions.  [place], PA 11/02/2020, 4:09 PM

## 2020-11-02 NOTE — Telephone Encounter (Signed)
Received preliminary anticoagulation  pre-op note from [place] PA to hold [place] prior to procedure. Waiting on Dr Marlou Porch for final approval. Patient notified that he will be notified once MD reviews. Patient voiced understanding.

## 2020-11-02 NOTE — Telephone Encounter (Signed)
Dr. Marlou Porch, Pt had DES-pLAD 11/05/19. May he hold brilinta for 5 days prior to a EGD on 12/07/20?

## 2020-11-15 IMAGING — DX DG CHEST 1V PORT
1 series · 1 of 1 positions shown · non-contrast
Comparison: None.

CLINICAL DATA: Chest pain

EXAM:
PORTABLE CHEST 1 VIEW

[chest ap]
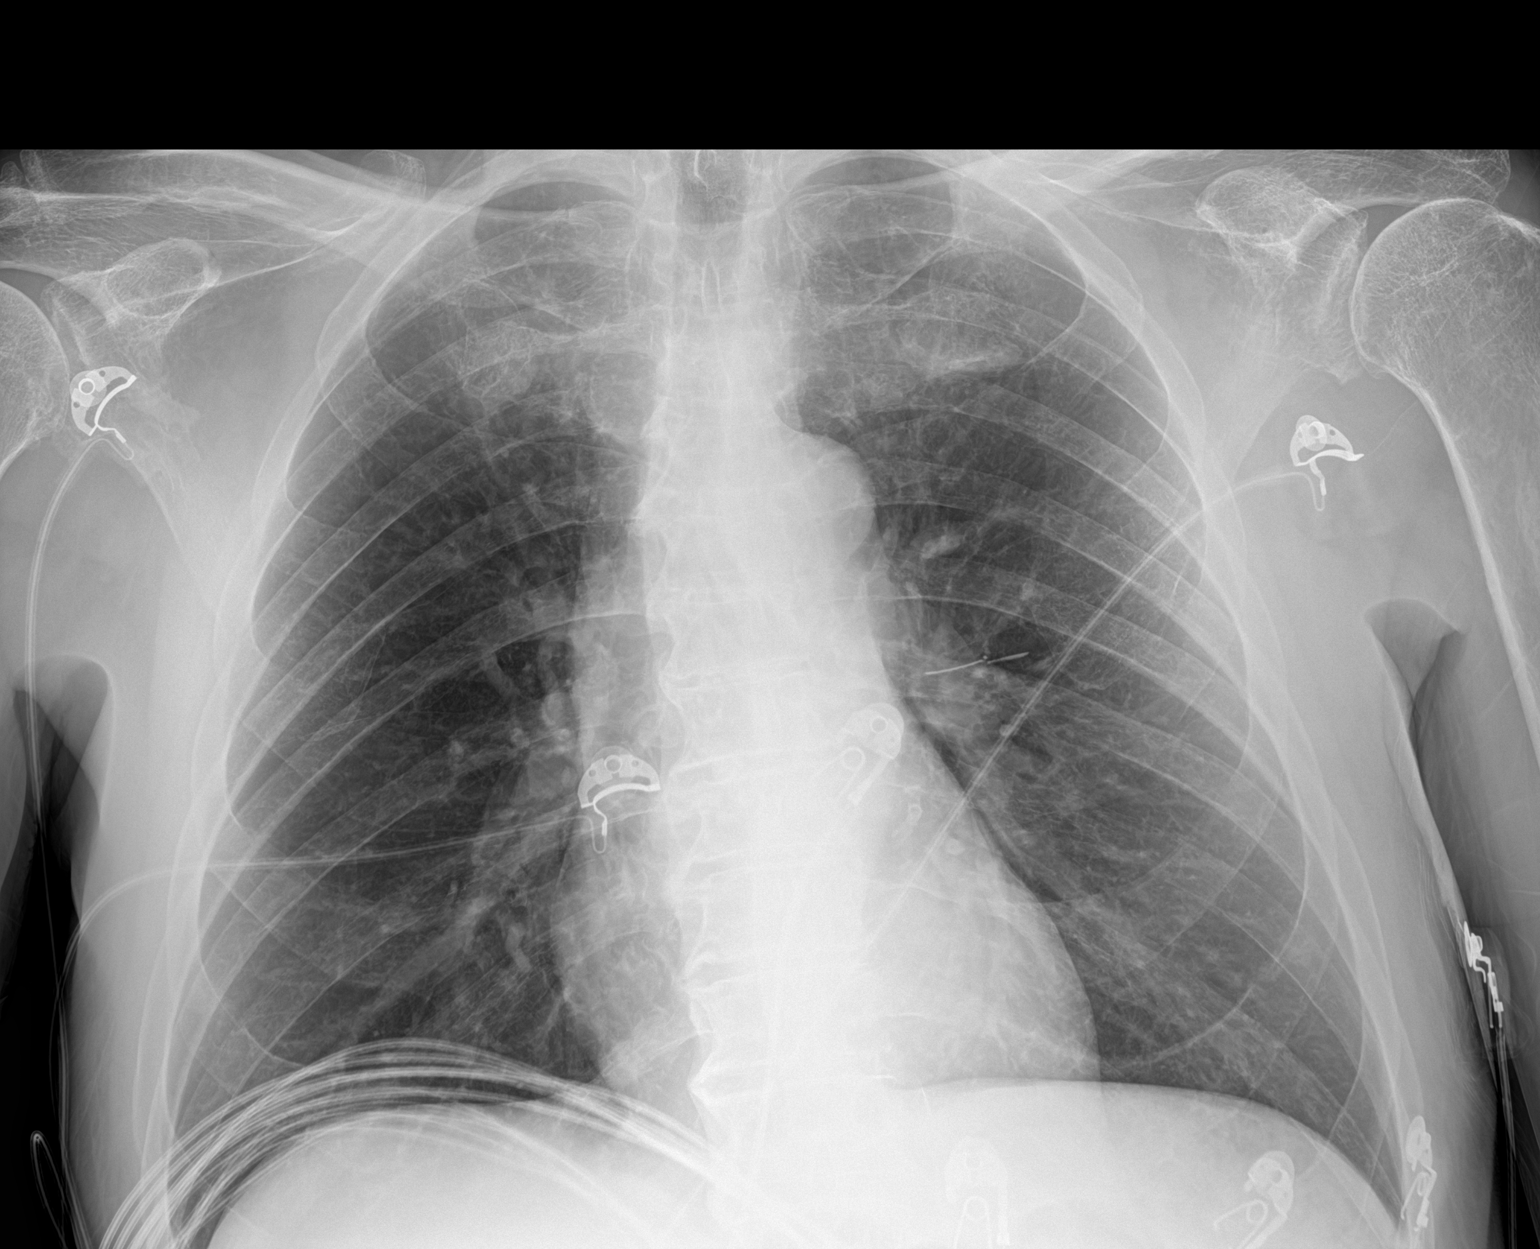

[1 of 1 positions shown; findings below may reference images not displayed]

FINDINGS: The heart size and mediastinal contours are within normal limits.
Both lungs are clear. The visualized skeletal structures are
unremarkable.

Linear radiopacity projecting over the left hilum which may reflect
a foreign body versus an item superficial to the patient.
IMPRESSION: No acute cardiopulmonary disease.

## 2020-11-26 NOTE — Progress Notes (Signed)
Attempted to obtain medical history via telephone, unable to reach at this time. I left a voicemail to return pre surgical testing department's phone call.  

## 2020-11-27 ENCOUNTER — Other Ambulatory Visit: Payer: Self-pay

## 2020-11-30 ENCOUNTER — Other Ambulatory Visit (HOSPITAL_COMMUNITY)
Admission: RE | Admit: 2020-11-30 | Discharge: 2020-11-30 | Disposition: A | Discharge status: Home / Self Care | Payer: Medicare Other | Source: Ambulatory Visit | Attending: Gastroenterology | Admitting: Gastroenterology

## 2020-11-30 DIAGNOSIS — Z20822 Contact with and (suspected) exposure to covid-19: Secondary | ICD-10-CM | POA: Insufficient documentation

## 2020-11-30 DIAGNOSIS — Z01812 Encounter for preprocedural laboratory examination: Secondary | ICD-10-CM | POA: Insufficient documentation

## 2020-11-30 LAB — SARS CORONAVIRUS 2 (TAT 6-24 HRS): SARS Coronavirus 2: NEGATIVE

## 2020-12-02 ENCOUNTER — Encounter (HOSPITAL_COMMUNITY): Payer: Self-pay | Admitting: Gastroenterology

## 2020-12-02 ENCOUNTER — Other Ambulatory Visit: Payer: Self-pay

## 2020-12-02 ENCOUNTER — Ambulatory Visit (HOSPITAL_COMMUNITY): Payer: Medicare Other | Admitting: Certified Registered"

## 2020-12-02 ENCOUNTER — Encounter (HOSPITAL_COMMUNITY)
Admission: RE | Disposition: A | Discharge status: Home / Self Care | Payer: Self-pay | Source: Home / Self Care | Attending: Gastroenterology

## 2020-12-02 ENCOUNTER — Ambulatory Visit (HOSPITAL_COMMUNITY)
Admission: RE | Admit: 2020-12-02 | Discharge: 2020-12-02 | Disposition: A | Discharge status: Home / Self Care | Payer: Medicare Other | Attending: Gastroenterology | Admitting: Gastroenterology

## 2020-12-02 DIAGNOSIS — K209 Esophagitis, unspecified without bleeding: Secondary | ICD-10-CM

## 2020-12-02 DIAGNOSIS — K449 Diaphragmatic hernia without obstruction or gangrene: Secondary | ICD-10-CM | POA: Diagnosis not present

## 2020-12-02 DIAGNOSIS — Z9841 Cataract extraction status, right eye: Secondary | ICD-10-CM | POA: Diagnosis not present

## 2020-12-02 DIAGNOSIS — R1310 Dysphagia, unspecified: Secondary | ICD-10-CM

## 2020-12-02 DIAGNOSIS — Z9842 Cataract extraction status, left eye: Secondary | ICD-10-CM | POA: Insufficient documentation

## 2020-12-02 DIAGNOSIS — R1319 Other dysphagia: Secondary | ICD-10-CM

## 2020-12-02 DIAGNOSIS — Z955 Presence of coronary angioplasty implant and graft: Secondary | ICD-10-CM | POA: Diagnosis not present

## 2020-12-02 DIAGNOSIS — Z85828 Personal history of other malignant neoplasm of skin: Secondary | ICD-10-CM | POA: Diagnosis not present

## 2020-12-02 DIAGNOSIS — I10 Essential (primary) hypertension: Secondary | ICD-10-CM | POA: Diagnosis not present

## 2020-12-02 DIAGNOSIS — K222 Esophageal obstruction: Secondary | ICD-10-CM | POA: Diagnosis not present

## 2020-12-02 DIAGNOSIS — I252 Old myocardial infarction: Secondary | ICD-10-CM | POA: Insufficient documentation

## 2020-12-02 HISTORY — PX: SUBMUCOSAL INJECTION: SHX5543

## 2020-12-02 HISTORY — PX: ESOPHAGOGASTRODUODENOSCOPY (EGD) WITH PROPOFOL: SHX5813

## 2020-12-02 HISTORY — PX: ESOPHAGEAL DILATION: SHX303

## 2020-12-02 LAB — GLUCOSE, CAPILLARY: Glucose-Capillary: 133 mg/dL — ABNORMAL HIGH (ref 70–99)

## 2020-12-02 SURGERY — ESOPHAGOGASTRODUODENOSCOPY (EGD) WITH PROPOFOL
Anesthesia: Monitor Anesthesia Care

## 2020-12-02 MED ORDER — TRIAMCINOLONE ACETONIDE 40 MG/ML IJ SUSP
INTRAMUSCULAR | Status: AC
  Filled 2020-12-02: qty 1

## 2020-12-02 MED ORDER — SUCRALFATE 1 GM/10ML PO SUSP: 1.0000 g | Freq: Four times a day (QID) | ORAL | 1 refills | Status: DC

## 2020-12-02 MED ORDER — TRIAMCINOLONE ACETONIDE 40 MG/ML IJ SUSP
INTRAMUSCULAR | Status: DC | PRN
  Administered 2020-12-02: 40 mg

## 2020-12-02 MED ORDER — PROPOFOL 500 MG/50ML IV EMUL
INTRAVENOUS | Status: DC | PRN
  Administered 2020-12-02: 125 ug/kg/min via INTRAVENOUS

## 2020-12-02 MED ORDER — PROPOFOL 10 MG/ML IV BOLUS
INTRAVENOUS | Status: AC
  Filled 2020-12-02: qty 20

## 2020-12-02 MED ORDER — LACTATED RINGERS IV SOLN: INTRAVENOUS | Status: DC

## 2020-12-02 MED ORDER — SODIUM CHLORIDE 0.9 % IV SOLN: INTRAVENOUS | Status: DC

## 2020-12-02 MED ORDER — PROPOFOL 500 MG/50ML IV EMUL
INTRAVENOUS | Status: AC
  Filled 2020-12-02: qty 50

## 2020-12-02 MED ORDER — PROPOFOL 10 MG/ML IV BOLUS
INTRAVENOUS | Status: DC | PRN
  Administered 2020-12-02: 20 mg via INTRAVENOUS

## 2020-12-02 SURGICAL SUPPLY — 14 items

## 2020-12-02 NOTE — H&P (Signed)
P  Chief Complaint:    Dysphagia  GI History: 70 year old male with history of progressive dysphagia to solids over the last 2 years, without improvement with PPI, Carafate. Recent EGDs with severe proximal esophageal stenosis and esophagitis. Biopsies were concerning for lymphocytic esophagitis in 09/2018, and more recently suspicious for lichenoid esophagitis.   Endoscopic history: -EGD (06/2018, Dr. Alessandra Bevels): LA Grade C esophagitis (biopsy: Ulcerated mucosa with reflux changes), non-H. pylori gastritis. Normal duodenum with normal biopsies. -06/2017: Started PPI bid -EGD (09/2018, Dr. Alessandra Bevels): LA Grade C esophagitis, a few benign-appearing intrinsic moderate stenoses (biopsy: Increased lymphocytes concerning for lymphocytic esophagitis) -09/2018: ContinuedbidPPI, added Carafate with resolution of reflux symptoms -EGD (05/2019, Dr. Silverio Decamp): LA Grade C esophagitis 20-22 cm from incisors, severe stenosis at 22 cm 8 mm diameter, not traversable. Recommended repeat at [facility] with pediatric endoscope -EGD (04/2019, Dr. Bryan Lemma): 2 stenoses located in proximal esophagus (18 cm from incisors) and GEJ (37 cm from incisors). Dilated with 13 mm TTS balloon with mucosal rent. Moderately severe esophagitis mucosal friability throughout esophagus. Biopsies suggestive of lichenoid esophagitis. No evidence of EOE or lymphocytic esophagitis. 3 cm HH. Normal stomach and duodenum.  -EGD (07/2019, Dr. Bryan Lemma): LA Grade C esophagitis, 2-3 cm HH, pan esophageal narrowing with severe stenosis at 18 and 40 cm from incisors, dilated with 8,9,11 mm Savary dilator with appropriate mucosal rent. Biopsies negative for EoE, LyE -EGD (08/2019, Dr. Bryan Lemma): Narrowed esophagus with dominant stricture at 20 and 40 cm, dilated with 13 mm Savary. Esophagitis 24-40 cm, 2 cm HH. Bxs negative for LyE, EoE -EGD (09/2019, Dr. Bryan Lemma): Narrowed esophagus with dominant stricture x2 at 36 and 40 cm  dilated with 14 mm Savary. 2 cm HH.   -Previously followed in [place] with EGD approximately7/2019 (GERD, dysphagia, odynophagia) with Candida Esophagitismay have shown fungal infection and H. pylori per patient. No reports available for review. -Colonoscopy (approximately 02/2018, [place]): Normal per patient  HPI:     Patient is a 70 y.o. male presenting to [facility] for EGD for continued esophageal dilation and esophageal biopsies.  Last seen in the office on 10/22/2020, and endorsed return of index dysphagia despite serial dilations in 2020/early 2021 as outlined above.  History of STEMI in 10/2019 requiring PCI DES to LAD.  Has now completed 6+ months of Brilinta/ASA 81 mg, and cleared by Cardiology to proceed with EGD with Brilinta hold.  Review of systems:     No chest pain, no SOB, no fevers, no urinary sx   Past Medical History:  Diagnosis Date  . Acquired esophageal ring   . Cancer ([place])    skin- upper back and lower back  . Cataract    bilateral-removed  . DM type 2 (diabetes mellitus, type 2) ([place])   . GERD (gastroesophageal reflux disease)   . History of esophagitis   . Hyperlipidemia    no meds  . Hypertension    no meds    Patient's surgical history, family medical history, social history, medications and allergies were all reviewed in Epic    No current facility-administered medications for this encounter.    Physical Exam:     Ht 5\' 11"  (1.803 m)   Wt 71.2 kg   BMI 21.90 kg/m   GENERAL:  Pleasant male in NAD PSYCH: : Cooperative, normal affect EENT:  conjunctiva pink, mucous membranes moist, neck supple without masses CARDIAC:  RRR, no murmur heard, no peripheral edema PULM: Normal respiratory effort, lungs CTA bilaterally, no wheezing  ABDOMEN:  Nondistended, soft, nontender. No obvious masses, no hepatomegaly,  normal bowel sounds SKIN:  turgor, no lesions seen Musculoskeletal:  Normal muscle tone, normal  strength NEURO: Alert and oriented x 3, no focal neurologic deficits   IMPRESSION and PLAN:    1) Dysphagia 2) Esophageal stricture 3) History of esophagitis  -Repeat EGD at [facility] today with esophageal dilation and repeat esophageal biopsies to again rule out EOE, lymphocytic esophagitis.  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

## 2020-12-02 NOTE — Discharge Instructions (Signed)
[email or web address]">  Esophageal Dilatation Esophageal dilatation, also called esophageal dilation, is a procedure to widen or open a blocked or narrowed part of the esophagus. The esophagus is the part of the body that moves food and liquid from the mouth to the stomach. You may need this procedure if:  You have a buildup of scar tissue in your esophagus that makes it difficult, painful, or impossible to swallow. This can be caused by gastroesophageal reflux disease (GERD).  You have cancer of the esophagus.  There is a problem with how food moves through your esophagus. In some cases, you may need this procedure repeated at a later time to dilate the esophagus gradually. Tell a health care provider about:  Any allergies you have.  All medicines you are taking, including vitamins, herbs, eye drops, creams, and over-the-counter medicines.  Any problems you or family members have had with anesthetic medicines.  Any blood disorders you have.  Any surgeries you have had.  Any medical conditions you have.  Any antibiotic medicines you are required to take before dental procedures.  Whether you are pregnant or may be pregnant. What are the risks? Generally, this is a safe procedure. However, problems may occur, including:  Bleeding due to a tear in the lining of the esophagus.  A hole, or perforation, in the esophagus. What happens before the procedure?  Ask your health care provider about: ? Changing or stopping your regular medicines. This is especially important if you are taking diabetes medicines or blood thinners. ? Taking medicines such as aspirin and ibuprofen. These medicines can thin your blood. Do not take these medicines unless your health care provider tells you to take them. ? Taking over-the-counter medicines, vitamins, herbs, and supplements.  Follow instructions from your health care  provider about eating or drinking restrictions.  Plan to have a responsible adult take you home from the hospital or clinic.  Plan to have a responsible adult care for you for the time you are told after you leave the hospital or clinic. This is important. What happens during the procedure?  You may be given a medicine to help you relax (sedative).  A numbing medicine may be sprayed into the back of your throat, or you may gargle the medicine.  Your health care provider may perform the dilatation using various surgical instruments, such as: ? Simple dilators. This instrument is carefully placed in the esophagus to stretch it. ? Guided wire bougies. This involves using an endoscope to insert a wire into the esophagus. A dilator is passed over this wire to enlarge the esophagus. Then the wire is removed. ? Balloon dilators. An endoscope with a small balloon is inserted into the esophagus. The balloon is inflated to stretch the esophagus and open it up. The procedure may vary among health care providers and hospitals. What can I expect after the procedure?  Your blood pressure, heart rate, breathing rate, and blood oxygen level will be monitored until you leave the hospital or clinic.  Your throat may feel slightly sore and numb. This will get better over time.  You will not be allowed to eat or drink until your throat is no longer numb.  When you are able to drink, urinate, and sit on the edge of the bed without nausea or dizziness, you may be able to return home. Follow these instructions at home:  Take over-the-counter and prescription medicines only as told by your health care provider.  If you were given a sedative during  the procedure, it can affect you for several hours. Do not drive or operate machinery until your health care provider says that it is safe.  Plan to have a responsible adult care for you for the time you are told. This is important.  Follow instructions from your  health care provider about any eating or drinking restrictions.  Do not use any products that contain nicotine or tobacco, such as cigarettes, e-cigarettes, and chewing tobacco. If you need help quitting, ask your health care provider.  Keep all follow-up visits. This is important. Contact a health care provider if:  You have a fever.  You have pain that is not relieved by medicine. Get help right away if:  You have chest pain.  You have trouble breathing.  You have trouble swallowing.  You vomit blood.  You have black, tarry, or bloody stools. These symptoms may represent a serious problem that is an emergency. Do not wait to see if the symptoms will go away. Get medical help right away. Call your local emergency services (911 in the [place] Do not drive yourself to the hospital. Summary  Esophageal dilatation, also called esophageal dilation, is a procedure to widen or open a blocked or narrowed part of the esophagus.  Plan to have a responsible adult take you home from the hospital or clinic.  For this procedure, a numbing medicine may be sprayed into the back of your throat, or you may gargle the medicine.  Do not drive or operate machinery until your health care provider says that it is safe. This information is not intended to replace advice given to you by your health care provider. Make sure you discuss any questions you have with your health care provider. Document Revised: 01/29/2020 Document Reviewed: 01/29/2020 Elsevier Patient Education  2021 Leonore HAD AN ENDOSCOPIC PROCEDURE TODAY: Refer to the procedure report and other information in the discharge instructions given to you for any specific questions about what was found during the examination. If this information does not answer your questions, please call [place] office at [phone] to clarify.   YOU SHOULD EXPECT: Some feelings of bloating in the abdomen. Passage of more gas than usual. Walking  can help get rid of the air that was put into your GI tract during the procedure and reduce the bloating. If you had a lower endoscopy (such as a colonoscopy or flexible sigmoidoscopy) you may notice spotting of blood in your stool or on the toilet paper. Some abdominal soreness may be present for a day or two, also.  DIET: Your first meal following the procedure should be a light meal and then it is ok to progress to your normal diet. A half-sandwich or bowl of soup is an example of a good first meal. Heavy or fried foods are harder to digest and may make you feel nauseous or bloated. Drink plenty of fluids but you should avoid alcoholic beverages for 24 hours. If you had a esophageal dilation, please see attached instructions for diet.    ACTIVITY: Your care partner should take you home directly after the procedure. You should plan to take it easy, moving slowly for the rest of the day. You can resume normal activity the day after the procedure however YOU SHOULD NOT DRIVE, use power tools, machinery or perform tasks that involve climbing or major physical exertion for 24 hours (because of the sedation medicines used during the test).   SYMPTOMS TO REPORT IMMEDIATELY: A gastroenterologist can be  reached at any hour. Please call [phone]  for any of the following symptoms:   . Following upper endoscopy (EGD, EUS, ERCP, esophageal dilation) Vomiting of blood or coffee ground material  New, significant abdominal pain  New, significant chest pain or pain under the shoulder blades  Painful or persistently difficult swallowing  New shortness of breath  Black, tarry-looking or red, bloody stools  FOLLOW UP:  If any biopsies were taken you will be contacted by phone or by letter within the next 1-3 weeks. Call [phone]  if you have not heard about the biopsies in 3 weeks.  Please also call with any specific questions about appointments or follow up tests.

## 2020-12-02 NOTE — Interval H&P Note (Signed)
History and Physical Interval Note:  12/02/2020 10:33 AM  Alfred Clay  has presented today for surgery, with the diagnosis of Dysphagia.  The various methods of treatment have been discussed with the patient and family. After consideration of risks, benefits and other options for treatment, the patient has consented to  Procedure(s): ESOPHAGOGASTRODUODENOSCOPY (EGD) WITH PROPOFOL (N/A) with esophageal dilation, biopsies, and possible kenalog injection as a surgical intervention.  The patient's history has been reviewed, patient examined, no change in status, stable for surgery.  I have reviewed the patient's chart and labs.  Questions were answered to the patient's satisfaction.     [name] Alfred Clay

## 2020-12-02 NOTE — Anesthesia Postprocedure Evaluation (Signed)
Anesthesia Post Note  Patient: Alfred Clay  Procedure(s) Performed: ESOPHAGOGASTRODUODENOSCOPY (EGD) WITH PROPOFOL (N/A ) SUBMUCOSAL INJECTION ESOPHAGEAL DILATION     Patient location during evaluation: PACU Anesthesia Type: MAC Level of consciousness: awake and alert and oriented Pain management: pain level controlled Vital Signs Assessment: post-procedure vital signs reviewed and stable Respiratory status: spontaneous breathing, nonlabored ventilation and respiratory function stable Cardiovascular status: blood pressure returned to baseline Postop Assessment: no apparent nausea or vomiting Anesthetic complications: no   No complications documented.  Last Vitals:  Vitals:   12/02/20 1241 12/02/20 1251  BP: 114/69 124/75  Pulse: (!) 53 (!) 56  Resp: (!) 25 14  Temp:    SpO2: 100% 99%    Last Pain:  Vitals:   12/02/20 1251  TempSrc:   PainSc: 0-No pain                 Brennan Bailey

## 2020-12-02 NOTE — Anesthesia Preprocedure Evaluation (Addendum)
Anesthesia Evaluation  Patient identified by MRN, date of birth, ID band Patient awake    Reviewed: Allergy & Precautions, NPO status , Patient's Chart, lab work & pertinent test results, reviewed documented beta blocker date and time   History of Anesthesia Complications Negative for: history of anesthetic complications  Airway Mallampati: II  TM Distance: >3 FB Neck ROM: Full    Dental no notable dental hx.    Pulmonary neg pulmonary ROS,    Pulmonary exam normal        Cardiovascular hypertension, Pt. on home beta blockers + Past MI  Normal cardiovascular exam     Neuro/Psych negative neurological ROS  negative psych ROS   GI/Hepatic Neg liver ROS, GERD  ,dysphagia   Endo/Other  diabetes, Type 2, Oral Hypoglycemic Agents  Renal/GU negative Renal ROS  negative genitourinary   Musculoskeletal negative musculoskeletal ROS (+)   Abdominal   Peds  Hematology negative hematology ROS (+)   Anesthesia Other Findings Day of surgery medications reviewed with patient.  Reproductive/Obstetrics negative OB ROS                            Anesthesia Physical Anesthesia Plan  ASA: III  Anesthesia Plan: MAC   Post-op Pain Management:    Induction:   PONV Risk Score and Plan: Treatment may vary due to age or medical condition and Propofol infusion  Airway Management Planned: Natural Airway and Nasal Cannula  Additional Equipment: None  Intra-op Plan:   Post-operative Plan:   Informed Consent: I have reviewed the patients History and Physical, chart, labs and discussed the procedure including the risks, benefits and alternatives for the proposed anesthesia with the patient or authorized representative who has indicated his/her understanding and acceptance.       Plan Discussed with: CRNA  Anesthesia Plan Comments:         Anesthesia Quick Evaluation

## 2020-12-02 NOTE — Transfer of Care (Signed)
Immediate Anesthesia Transfer of Care Note  Patient: DELFIN SQUILLACE  Procedure(s) Performed: ESOPHAGOGASTRODUODENOSCOPY (EGD) WITH PROPOFOL (N/A ) SUBMUCOSAL INJECTION ESOPHAGEAL DILATION  Patient Location: PACU  Anesthesia Type:MAC  Level of Consciousness: sedated  Airway & Oxygen Therapy: Patient Spontanous Breathing and Patient connected to face mask oxygen  Post-op Assessment: Report given to RN and Post -op Vital signs reviewed and stable  Post vital signs: Reviewed and stable  Last Vitals:  Vitals Value Taken Time  BP    Temp    Pulse    Resp    SpO2      Last Pain:  Vitals:   12/02/20 1038  TempSrc: Oral  PainSc: 0-No pain         Complications: No complications documented.

## 2020-12-02 NOTE — Op Note (Signed)
[facility] Patient Name: Alfred Clay Procedure Date: 12/02/2020 MRN: [id number] Attending MD: Gerrit Heck , MD Date of Birth: [birth year] CSN: [id number] Age: 70 Admit Type: Outpatient Procedure:                Upper GI endoscopy Indications:              Dysphagia, Stenosis of the esophagus, For therapy                            of esophageal stenosis                           History of esophageal stenosis, requiring serial                            dilation as below, now with worsening dysphagia.                           -EGD (06/2018, Dr. Alessandra Bevels): LA Grade C                            esophagitis (biopsy: Ulcerated mucosa with reflux                            changes), non-H. pylori gastritis. Normal duodenum                            with normal biopsies.                           -EGD (09/2018, Dr. Alessandra Bevels): LA Grade C                            esophagitis, a few benign-appearing intrinsic                            moderate stenoses (biopsy: Increased lymphocytes                            concerning for lymphocytic esophagitis)                           -EGD (07/2019, Dr. Bryan Lemma): LA Grade C                            esophagitis, 2-3 cm HH, pan esophageal narrowing                            with severe stenosis at 18 and 40 cm from incisors,                            dilated with 8,9,11 mm Savary dilator with                           appropriate mucosal rent. Biopsies negative for  EoE, LyE                           -EGD (05/2019, Dr. Silverio Decamp): LA Grade C esophagitis                            20-22 cm from incisors, severe stenosis at 22 cm 8                            mm diameter, not traversable. Recommended repeat at                            [facility] with pediatric                           endoscope                           -EGD (04/2019, Dr. Bryan Lemma): 2 stenoses located                            in  proximal esophagus (18 cm from incisors) and GE                            J (37 cm from incisors). Dilated with 13 mm TTS                            balloon with mucosal rent.                           Moderately severe esophagitis mucosal friability                            throughout esophagus. Biopsies suggestive of                            lichenoid esophagitis. No evidence of EOE or                            lymphocytic esophagitis. 3 cm HH. Normal                           stomach and duodenum. Recommended repeat 4 weeks                           -EGD (08/2019, Dr. Bryan Lemma): Narrowed esophagus                            with dominant stricture at 18 and 40 cm, dilated                            with 13 mm Savary. Esophagitis 24-40 cm, 2 cm HH.                            +Bxs negative for  LyE, EoE                           ?"EGD (09/2019, Dr. Bryan Lemma): Narrowed esophagus                            with dominant stricture x2 at 47 and 40 cm dilated                            with 14 mm Savary. 2 cm HH. Providers:                Gerrit Heck, MD, Baird Cancer, RN, Laverda Sorenson, Technician, Jefm Miles CRNA Referring MD:              Medicines:                Monitored Anesthesia Care Complications:            No immediate complications. Estimated Blood Loss:     Estimated blood loss was minimal. Procedure:                Pre-Anesthesia Assessment:                           - Prior to the procedure, a History and Physical                            was performed, and patient medications and                            allergies were reviewed. The patient's tolerance of                            previous anesthesia was also reviewed. The risks                            and benefits of the procedure and the sedation                            options and risks were discussed with the patient.                            All questions were  answered, and informed consent                            was obtained. Prior Anticoagulants: The patient has                            Brilinta, last dose was 5 days prior to procedure.                            ASA Grade Assessment: III - A patient with severe  systemic disease. After reviewing the risks and                            benefits, the patient was deemed in satisfactory                            condition to undergo the procedure.                           After obtaining informed consent, the endoscope was                            passed under direct vision. Throughout the                            procedure, the patient's blood pressure, pulse, and                            oxygen saturations were monitored continuously. The                            GIF-H190 ([id number]) Olympus gastroscope was                            introduced through the mouth, and advanced to the                            second part of duodenum. The upper GI endoscopy was                            technically difficult and complex due to stenosis.                            The patient tolerated the procedure well. Scope In: Scope Out: Findings:      A benign-appearing, intrinsic severe stenosis was found 18 cm from the       incisors. The narrowest point measured 6 mm (inner diameter). The       stenosis was traversed only after dilation. A TTS dilator was passed       through the scope. Dilation with a 6-7-8 mm balloon dilator was       performed to 8 mm. The dilation site was examined and showed mild       mucosal disruption and moderate improvement in luminal narrowing.       Estimated blood loss was minimal. Area was then successfully injected       with 2 mL of triamcinolone (10 mg/mL), delivered in 0.5 mL aliquots in 4       quadrants. Estimated blood loss was minimal.      One benign-appearing, intrinsic moderate stenosis was found 40 cm from       the  incisors. This stenosis measured 9 mm (inner diameter). The stenosis       was traversed. A TTS dilator was passed through the scope. Dilation with       a 07-07-11 mm balloon dilator was performed to 12 mm. The dilation site  was examined and showed moderate mucosal disruption and moderate       improvement in luminal narrowing. Area was then successfully injected       with 2 mL of triamcinolone (10 mg/mL), delivered in 0.5 mL aliquots in 4       quadrants. Estimated blood loss was minimal.      Esophagitis with no bleeding and diffuse luminal narrowing was found in       the entire esophagus.      A 3 cm hiatal hernia was present.      The entire examined stomach was normal.      The examined duodenum was normal. Impression:               - Benign-appearing esophageal stenoses. Dilated.                            Injected.                           - Benign-appearing esophageal stenosis. Dilated.                            Injected.                           - Esophagitis with no bleeding.                           - 3 cm hiatal hernia.                           - Normal stomach.                           - Normal examined duodenum.                           - No specimens collected. Moderate Sedation:      Not Applicable - Patient had care per Anesthesia. Recommendation:           - Patient has a contact number available for                            emergencies. The signs and symptoms of potential                            delayed complications were discussed with the                            patient. Return to normal activities tomorrow.                            Written discharge instructions were provided to the                            patient.                           - Full liquid diet today then  advance to soft foods                            tomorrow and slowlly advance diet as tolerated.                           - Continue present medications.                            - Repeat upper endoscopy in 4 weeks for                            retreatment. To schedule at [facility]                            again. Will plan for repeat esophageal biopsies at                            that time.                           - Resume Brilinta in 2 days.                           - Start carafate 1 gm PO QID. Procedure Code(s):        --- Professional ---                           [id number], Esophagogastroduodenoscopy, flexible,                            transoral; with transendoscopic balloon dilation of                            esophagus (less than 30 mm diameter)                           43236, 59, Esophagogastroduodenoscopy, flexible,                            transoral; with directed submucosal injection(s),                            any substance Diagnosis Code(s):        --- Professional ---                           K22.2, Esophageal obstruction                           K20.90, Esophagitis, unspecified without bleeding                           K44.9, Diaphragmatic hernia without obstruction or                            gangrene  R13.10, Dysphagia, unspecified CPT copyright 2019 [employer]. All rights reserved. The codes documented in this report are preliminary and upon coder review may  be revised to meet current compliance requirements. Gerrit Heck, MD 12/02/2020 12:45:13 PM Number of Addenda: 0

## 2020-12-04 ENCOUNTER — Encounter (HOSPITAL_COMMUNITY): Payer: Self-pay | Admitting: Gastroenterology

## 2020-12-04 ENCOUNTER — Other Ambulatory Visit (HOSPITAL_COMMUNITY): Payer: 59

## 2020-12-28 ENCOUNTER — Encounter: Payer: Self-pay | Admitting: Cardiology

## 2020-12-28 ENCOUNTER — Other Ambulatory Visit: Payer: Self-pay

## 2020-12-28 ENCOUNTER — Ambulatory Visit (INDEPENDENT_AMBULATORY_CARE_PROVIDER_SITE_OTHER): Payer: Medicare Other | Admitting: Cardiology

## 2020-12-28 VITALS — BP 116/74 | HR 60 | Ht 71.0 in | Wt 161.4 lb

## 2020-12-28 DIAGNOSIS — I255 Ischemic cardiomyopathy: Secondary | ICD-10-CM | POA: Diagnosis not present

## 2020-12-28 DIAGNOSIS — I2510 Atherosclerotic heart disease of native coronary artery without angina pectoris: Secondary | ICD-10-CM

## 2020-12-28 DIAGNOSIS — Z955 Presence of coronary angioplasty implant and graft: Secondary | ICD-10-CM

## 2020-12-28 NOTE — Progress Notes (Signed)
Cardiology Office Note:    Date:  12/28/2020   ID:  Alfred Clay, DOB 08-27-1951, MRN [id number]  PCP:  Ileene Rubens   [facility]  Cardiologist:  Candee Furbish, MD  Advanced Practice Provider:  No care team member to display Electrophysiologist:  None       Referring MD: Shelda Pal*     History of Present Illness:    Alfred Clay is a 70 y.o. male here for preoperative evaluation.  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  At 1 point wore a LifeVest.  Coronary artery disease-PCI DES to LAD.  99% lesion. 11/05/2019  Seems to doing quite well.  Has had some esophageal dysphagia.  Has had occasional minor ecchymosis at times.  Plan 4 weeks EGD. Stricture. May inject steroid.   Past Medical History:  Diagnosis Date  . Acquired esophageal ring   . Cancer ([place])    skin- upper back and lower back  . Cataract    bilateral-removed  . DM type 2 (diabetes mellitus, type 2) ([place])   . GERD (gastroesophageal reflux disease)   . History of esophagitis   . Hyperlipidemia    no meds  . Hypertension    no meds    Past Surgical History:  Procedure Laterality Date  . BIOPSY  08/06/2019   Procedure: BIOPSY;  Surgeon: Lavena Bullion, DO;  Location: [facility];  Service: Gastroenterology;;  . CATARACT EXTRACTION Bilateral 10/2018  . COLONOSCOPY  2018  . CORONARY STENT INTERVENTION N/A 11/05/2019   Procedure: CORONARY STENT INTERVENTION;  Surgeon: Burnell Blanks, MD;  Location: [place] [facility];  Service: Cardiovascular;  Laterality: N/A;  . CORONARY/GRAFT ACUTE MI REVASCULARIZATION N/A 11/05/2019   Procedure: Coronary/Graft Acute MI Revascularization;  Surgeon: Burnell Blanks, MD;  Location: [place] [facility];  Service: Cardiovascular;  Laterality: N/A;  . ESOPHAGEAL DILATION  12/02/2020   Procedure: ESOPHAGEAL DILATION;  Surgeon: Lavena Bullion, DO;  Location: [facility];  Service: Gastroenterology;;  . ESOPHAGOGASTRODUODENOSCOPY  10/16/2018  . ESOPHAGOGASTRODUODENOSCOPY (EGD) WITH PROPOFOL N/A 08/06/2019   Procedure: ESOPHAGOGASTRODUODENOSCOPY (EGD) WITH PROPOFOL;  Surgeon: Lavena Bullion, DO;  Location: [facility];  Service: Gastroenterology;  Laterality: N/A;  with dilation using ped scope  . ESOPHAGOGASTRODUODENOSCOPY (EGD) WITH PROPOFOL N/A 12/02/2020   Procedure: ESOPHAGOGASTRODUODENOSCOPY (EGD) WITH PROPOFOL;  Surgeon: Lavena Bullion, DO;  Location: [facility];  Service: Gastroenterology;  Laterality: N/A;  . HEMORRHOID SURGERY    . LEFT HEART CATH AND CORONARY ANGIOGRAPHY N/A 11/05/2019   Procedure: LEFT HEART CATH AND CORONARY ANGIOGRAPHY;  Surgeon: Burnell Blanks, MD;  Location: [place] [facility];  Service: Cardiovascular;  Laterality: N/A;  . SAVORY DILATION N/A 08/06/2019   Procedure: SAVORY DILATION;  Surgeon: Lavena Bullion, DO;  Location: [facility];  Service: Gastroenterology;  Laterality: N/A;  . SKIN CANCER EXCISION     back for skin cancer  . SUBMUCOSAL INJECTION  12/02/2020   Procedure: SUBMUCOSAL INJECTION;  Surgeon: Lavena Bullion, DO;  Location: [facility];  Service: Gastroenterology;;  . UPPER GASTROINTESTINAL ENDOSCOPY    . WISDOM TOOTH EXTRACTION      Current Medications: Current Meds  Medication Sig  . albuterol (VENTOLIN HFA) 108 (90 Base) MCG/ACT inhaler Inhale 2 puffs into the lungs every 6 (six) hours as needed for wheezing or shortness of breath.  . Apoaequorin (PREVAGEN EXTRA STRENGTH) 20  MG CAPS Take 20 mg by mouth daily.  Marland Kitchen aspirin EC 81 MG tablet Take 1 tablet (81 mg total) by mouth daily.  Marland Kitchen atorvastatin (LIPITOR) 80 MG tablet Take 1 tablet (80 mg total) by mouth daily.  . fluticasone (FLOVENT HFA) 110 MCG/ACT inhaler Inhale 2 puffs into the lungs in the morning and at bedtime.  . metFORMIN (GLUCOPHAGE) 500 MG tablet Take 1 tablet (500 mg total) by mouth 2  (two) times daily with a meal.  . metoprolol succinate (TOPROL XL) 25 MG 24 hr tablet Take 0.5 tablets (12.5 mg total) by mouth daily.  . nitroGLYCERIN (NITROSTAT) 0.4 MG SL tablet Place 1 tablet (0.4 mg total) under the tongue every 5 (five) minutes as needed.  . pantoprazole (PROTONIX) 40 MG tablet Take 40 mg by mouth 2 (two) times daily before a meal.  . sucralfate (CARAFATE) 1 GM/10ML suspension Take 10 mLs (1 g total) by mouth 4 (four) times daily.  . [DISCONTINUED] ticagrelor (BRILINTA) 90 MG TABS tablet Take 1 tablet (90 mg total) by mouth 2 (two) times daily.     Allergies:   Ace inhibitors   Social History   Socioeconomic History  . Marital status: Married    Spouse name: Not on file  . Number of children: Not on file  . Years of education: Not on file  . Highest education level: Not on file  Occupational History  . Not on file  Tobacco Use  . Smoking status: Never Smoker  . Smokeless tobacco: Never Used  Vaping Use  . Vaping Use: Never used  Substance and Sexual Activity  . Alcohol use: Yes    Comment: ocassionally liquor  . Drug use: Not Currently  . Sexual activity: Not on file  Other Topics Concern  . Not on file  Social History Narrative  . Not on file   Social Determinants of Health   Financial Resource Strain: Not on file  Food Insecurity: Not on file  Transportation Needs: Not on file  Physical Activity: Not on file  Stress: Not on file  Social Connections: Not on file     Family History: The patient's family history includes Colon cancer in his mother; Colon polyps in his mother; Diabetes in his mother. There is no history of Esophageal cancer, Rectal cancer, or Stomach cancer.  ROS:   Please see the history of present illness.     All other systems reviewed and are negative.  EKGs/Labs/Other Studies Reviewed:    The following studies were reviewed today:  Cath 11-05-2019: Diagnostic Dominance: Right    Intervention      EKG:  EKG is   ordered today.  The ekg ordered today demonstrates sinus rhythm 60 no other abnormalities  Recent Labs: 10/06/2020: ALT 35 10/09/2020: BUN 24; Creatinine, Ser 1.10; Potassium 4.8; Sodium 136  Recent Lipid Panel    Component Value Date/Time   CHOL 150 10/06/2020 1123   TRIG 56.0 10/06/2020 1123   HDL 56.50 10/06/2020 1123   CHOLHDL 3 10/06/2020 1123   VLDL 11.2 10/06/2020 1123   LDLCALC 82 10/06/2020 1123     Risk Assessment/Calculations:      Physical Exam:    VS:  BP 116/74   Pulse 60   Ht 5\' 11"  (1.803 m)   Wt 161 lb 6.4 oz (73.2 kg)   SpO2 98%   BMI 22.51 kg/m     Wt Readings from Last 3 Encounters:  12/28/20 161 lb 6.4 oz (73.2 kg)  12/02/20 157  lb (71.2 kg)  10/22/20 164 lb 6 oz (74.6 kg)     GEN:  Well nourished, well developed in no acute distress HEENT: Normal NECK: No JVD; No carotid bruits LYMPHATICS: No lymphadenopathy CARDIAC: RRR, no murmurs, rubs, gallops RESPIRATORY:  Clear to auscultation without rales, wheezing or rhonchi  ABDOMEN: Soft, non-tender, non-distended MUSCULOSKELETAL:  No edema; No deformity  SKIN: Warm and dry NEUROLOGIC:  Alert and oriented x 3 PSYCHIATRIC:  Normal affect   ASSESSMENT:    1. Coronary artery disease involving native coronary artery of native heart without angina pectoris   2. Ischemic cardiomyopathy   3. S/P coronary artery stent placement    PLAN:    In order of problems listed above:  Coronary artery disease prior anterior STEMI/ischemic cardiomyopathy -Angina overall well controlled, no chest pain, has nitroglycerin as needed.  EF has improved.  Works at [employer] with no restrictions.  Ischemic cardiomyopathy -Post PCI LAD 99% lesion.  EF originally was 30 to 35% but now normal.  Mixed hyperlipidemia -Doing well with atorvastatin high intensity with no myalgias.  Esophageal stricture -Seeing GI.  According to patient plans on a another EGD in 4 weeks with potential steroid injection.  He may proceed  with low to moderate overall cardiac risk given his prior CAD, LAD stent.  We are stopping his Brilinta today since he has been over 1 year dual antiplatelet therapy.  Continuing with aspirin 81 mg.  If aspirin can be continued during procedure I would prefer however if it must be stopped, hold for 5 days.     Medication Adjustments/Labs and Tests Ordered: Current medicines are reviewed at length with the patient today.  Concerns regarding medicines are outlined above.  No orders of the defined types were placed in this encounter.  No orders of the defined types were placed in this encounter.   Patient Instructions  Medication Instructions:  Your physician has recommended you make the following change in your medication: Stop Brilinta  *If you need a refill on your cardiac medications before your next appointment, please call your pharmacy*   Lab Work: none If you have labs (blood work) drawn today and your tests are completely normal, you will receive your results only by: Marland Kitchen MyChart Message (if you have MyChart) OR . A paper copy in the mail If you have any lab test that is abnormal or we need to change your treatment, we will call you to review the results.   Testing/Procedures: none   Follow-Up: At [facility], you and your health needs are our priority.  As part of our continuing mission to provide you with exceptional heart care, we have created designated Provider Care Teams.  These Care Teams include your primary Cardiologist (physician) and Advanced Practice Providers (APPs -  Physician Assistants and Nurse Practitioners) who all work together to provide you with the care you need, when you need it.  We recommend signing up for the patient portal called "MyChart".  Sign up information is provided on this After Visit Summary.  MyChart is used to connect with patients for Virtual Visits (Telemedicine).  Patients are able to view lab/test results, encounter notes, upcoming  appointments, etc.  Non-urgent messages can be sent to your provider as well.   To learn more about what you can do with MyChart, go to [email or web address].    Your next appointment:   6 month(s)  The format for your next appointment:   In Person  Provider:   You may  see Candee Furbish, MD or one of the following Advanced Practice Providers on your designated Care Team:    Kathyrn Drown, NP    Other Instructions      Signed, Candee Furbish, MD  12/28/2020 10:29 AM    [place]

## 2020-12-28 NOTE — Patient Instructions (Signed)
Medication Instructions:  Your physician has recommended you make the following change in your medication: Stop Brilinta  *If you need a refill on your cardiac medications before your next appointment, please call your pharmacy*   Lab Work: none If you have labs (blood work) drawn today and your tests are completely normal, you will receive your results only by: Marland Kitchen MyChart Message (if you have MyChart) OR . A paper copy in the mail If you have any lab test that is abnormal or we need to change your treatment, we will call you to review the results.   Testing/Procedures: none   Follow-Up: At [facility], you and your health needs are our priority.  As part of our continuing mission to provide you with exceptional heart care, we have created designated Provider Care Teams.  These Care Teams include your primary Cardiologist (physician) and Advanced Practice Providers (APPs -  Physician Assistants and Nurse Practitioners) who all work together to provide you with the care you need, when you need it.  We recommend signing up for the patient portal called "MyChart".  Sign up information is provided on this After Visit Summary.  MyChart is used to connect with patients for Virtual Visits (Telemedicine).  Patients are able to view lab/test results, encounter notes, upcoming appointments, etc.  Non-urgent messages can be sent to your provider as well.   To learn more about what you can do with MyChart, go to [email or web address].    Your next appointment:   6 month(s)  The format for your next appointment:   In Person  Provider:   You may see Candee Furbish, MD or one of the following Advanced Practice Providers on your designated Care Team:    Kathyrn Drown, NP    Other Instructions

## 2020-12-30 ENCOUNTER — Other Ambulatory Visit: Payer: Self-pay

## 2020-12-30 DIAGNOSIS — R1319 Other dysphagia: Secondary | ICD-10-CM

## 2020-12-30 DIAGNOSIS — K222 Esophageal obstruction: Secondary | ICD-10-CM

## 2020-12-30 NOTE — Telephone Encounter (Signed)
Per 12/28/20 cardiology note patient is no longer taking Brilinta, only Aspirin.   Ambulatory referral to GI in epic.

## 2021-01-04 ENCOUNTER — Ambulatory Visit: Payer: 59 | Admitting: Cardiology

## 2021-01-07 LAB — HM DIABETES EYE EXAM

## 2021-01-13 ENCOUNTER — Other Ambulatory Visit: Payer: Self-pay

## 2021-01-13 ENCOUNTER — Encounter (HOSPITAL_COMMUNITY): Payer: Self-pay | Admitting: Gastroenterology

## 2021-01-14 ENCOUNTER — Other Ambulatory Visit (HOSPITAL_COMMUNITY)
Admission: RE | Admit: 2021-01-14 | Discharge: 2021-01-14 | Disposition: A | Discharge status: Home / Self Care | Payer: Medicare Other | Source: Ambulatory Visit | Attending: Gastroenterology | Admitting: Gastroenterology

## 2021-01-14 DIAGNOSIS — Z20822 Contact with and (suspected) exposure to covid-19: Secondary | ICD-10-CM | POA: Insufficient documentation

## 2021-01-14 DIAGNOSIS — Z01812 Encounter for preprocedural laboratory examination: Secondary | ICD-10-CM | POA: Insufficient documentation

## 2021-01-14 LAB — SARS CORONAVIRUS 2 (TAT 6-24 HRS): SARS Coronavirus 2: NEGATIVE

## 2021-01-15 ENCOUNTER — Other Ambulatory Visit: Payer: Self-pay | Admitting: Family Medicine

## 2021-01-15 ENCOUNTER — Other Ambulatory Visit: Payer: Self-pay | Admitting: Gastroenterology

## 2021-01-17 ENCOUNTER — Encounter (HOSPITAL_COMMUNITY): Payer: Self-pay | Admitting: Gastroenterology

## 2021-01-18 ENCOUNTER — Telehealth: Payer: Self-pay

## 2021-01-18 ENCOUNTER — Encounter (HOSPITAL_COMMUNITY): Payer: Self-pay | Admitting: Gastroenterology

## 2021-01-18 ENCOUNTER — Ambulatory Visit (HOSPITAL_COMMUNITY)
Admission: RE | Admit: 2021-01-18 | Discharge: 2021-01-18 | Disposition: A | Discharge status: Home / Self Care | Payer: Medicare Other | Attending: Gastroenterology | Admitting: Gastroenterology

## 2021-01-18 ENCOUNTER — Encounter (HOSPITAL_COMMUNITY)
Admission: RE | Disposition: A | Discharge status: Home / Self Care | Payer: Self-pay | Source: Home / Self Care | Attending: Gastroenterology

## 2021-01-18 ENCOUNTER — Ambulatory Visit (HOSPITAL_COMMUNITY): Payer: Medicare Other | Admitting: Anesthesiology

## 2021-01-18 ENCOUNTER — Other Ambulatory Visit: Payer: Self-pay

## 2021-01-18 DIAGNOSIS — R1310 Dysphagia, unspecified: Secondary | ICD-10-CM

## 2021-01-18 DIAGNOSIS — K222 Esophageal obstruction: Secondary | ICD-10-CM | POA: Diagnosis not present

## 2021-01-18 DIAGNOSIS — I252 Old myocardial infarction: Secondary | ICD-10-CM | POA: Insufficient documentation

## 2021-01-18 DIAGNOSIS — E119 Type 2 diabetes mellitus without complications: Secondary | ICD-10-CM | POA: Insufficient documentation

## 2021-01-18 DIAGNOSIS — K2100 Gastro-esophageal reflux disease with esophagitis, without bleeding: Secondary | ICD-10-CM | POA: Diagnosis not present

## 2021-01-18 DIAGNOSIS — Z955 Presence of coronary angioplasty implant and graft: Secondary | ICD-10-CM | POA: Diagnosis not present

## 2021-01-18 DIAGNOSIS — K209 Esophagitis, unspecified without bleeding: Secondary | ICD-10-CM

## 2021-01-18 DIAGNOSIS — Z85828 Personal history of other malignant neoplasm of skin: Secondary | ICD-10-CM | POA: Diagnosis not present

## 2021-01-18 DIAGNOSIS — R1319 Other dysphagia: Secondary | ICD-10-CM

## 2021-01-18 DIAGNOSIS — I2510 Atherosclerotic heart disease of native coronary artery without angina pectoris: Secondary | ICD-10-CM | POA: Insufficient documentation

## 2021-01-18 DIAGNOSIS — K449 Diaphragmatic hernia without obstruction or gangrene: Secondary | ICD-10-CM | POA: Insufficient documentation

## 2021-01-18 DIAGNOSIS — K2210 Ulcer of esophagus without bleeding: Secondary | ICD-10-CM | POA: Insufficient documentation

## 2021-01-18 HISTORY — PX: BALLOON DILATION: SHX5330

## 2021-01-18 HISTORY — PX: ESOPHAGOGASTRODUODENOSCOPY (EGD) WITH PROPOFOL: SHX5813

## 2021-01-18 HISTORY — PX: BIOPSY: SHX5522

## 2021-01-18 HISTORY — PX: SCLEROTHERAPY: SHX6841

## 2021-01-18 LAB — GLUCOSE, CAPILLARY: Glucose-Capillary: 147 mg/dL — ABNORMAL HIGH (ref 70–99)

## 2021-01-18 SURGERY — ESOPHAGOGASTRODUODENOSCOPY (EGD) WITH PROPOFOL
Anesthesia: Monitor Anesthesia Care

## 2021-01-18 MED ORDER — GLYCOPYRROLATE 0.2 MG/ML IJ SOLN
INTRAMUSCULAR | Status: DC | PRN
  Administered 2021-01-18: .2 mg via INTRAVENOUS

## 2021-01-18 MED ORDER — TRIAMCINOLONE ACETONIDE 40 MG/ML IJ SUSP
INTRAMUSCULAR | Status: AC
  Filled 2021-01-18: qty 1

## 2021-01-18 MED ORDER — PROPOFOL 500 MG/50ML IV EMUL
INTRAVENOUS | Status: AC
  Filled 2021-01-18: qty 50

## 2021-01-18 MED ORDER — PROPOFOL 500 MG/50ML IV EMUL
INTRAVENOUS | Status: DC | PRN
  Administered 2021-01-18: 10 mg via INTRAVENOUS
  Administered 2021-01-18: 30 mg via INTRAVENOUS

## 2021-01-18 MED ORDER — LACTATED RINGERS IV SOLN: INTRAVENOUS | Status: DC

## 2021-01-18 MED ORDER — SODIUM CHLORIDE (PF) 0.9 % IJ SOLN
INTRAMUSCULAR | Status: AC
  Filled 2021-01-18: qty 10

## 2021-01-18 MED ORDER — PROPOFOL 1000 MG/100ML IV EMUL
INTRAVENOUS | Status: AC
  Filled 2021-01-18: qty 200

## 2021-01-18 MED ORDER — LACTATED RINGERS IV SOLN: INTRAVENOUS | Status: DC | PRN

## 2021-01-18 MED ORDER — PROPOFOL 500 MG/50ML IV EMUL
INTRAVENOUS | Status: DC | PRN
  Administered 2021-01-18: 125 ug/kg/min via INTRAVENOUS

## 2021-01-18 MED ORDER — SODIUM CHLORIDE 0.9 % IV SOLN
INTRAVENOUS | Status: DC
Start: 2021-01-18 — End: 2021-01-18

## 2021-01-18 MED ORDER — LIDOCAINE HCL (CARDIAC) PF 100 MG/5ML IV SOSY
PREFILLED_SYRINGE | INTRAVENOUS | Status: DC | PRN
  Administered 2021-01-18: 80 mg via INTRAVENOUS

## 2021-01-18 MED ORDER — TRIAMCINOLONE ACETONIDE 40 MG/ML IJ SUSP
INTRAMUSCULAR | Status: DC | PRN
Start: 2021-01-18 — End: 2021-01-18
  Administered 2021-01-18: 40 mg via INTRAMUSCULAR

## 2021-01-18 SURGICAL SUPPLY — 14 items

## 2021-01-18 NOTE — Telephone Encounter (Signed)
-----   Message from [place], DO sent at 01/18/2021  8:37 AM EDT ----- Needs repeat EGD with dilation at [facility] in 4-6 weeks. Thanks.

## 2021-01-18 NOTE — Interval H&P Note (Signed)
History and Physical Interval Note:  01/18/2021 7:37 AM  Alfred Clay  has presented today for surgery, with the diagnosis of dysphagia.  The various methods of treatment have been discussed with the patient and family. After consideration of risks, benefits and other options for treatment, the patient has consented to  Procedure(s) with comments: ESOPHAGOGASTRODUODENOSCOPY (EGD) WITH PROPOFOL (N/A) - with dilation BALLOON DILATION (N/A),  biopsies, and possible Kenalog injection as a surgical intervention.  The patient's history has been reviewed, patient examined, no change in status, stable for surgery.  I have reviewed the patient's chart and labs.  Questions were answered to the patient's satisfaction.     [name] [name]

## 2021-01-18 NOTE — Transfer of Care (Signed)
Immediate Anesthesia Transfer of Care Note  Patient: Alfred Clay  Procedure(s) Performed: Procedure(s) with comments: ESOPHAGOGASTRODUODENOSCOPY (EGD) WITH PROPOFOL (N/A) - with dilation BALLOON DILATION (N/A) SCLEROTHERAPY BIOPSY  Patient Location: PACU  Anesthesia Type:MAC  Level of Consciousness:  sedated, patient cooperative and responds to stimulation  Airway & Oxygen Therapy:Patient Spontanous Breathing and Patient connected to face mask oxgen  Post-op Assessment:  Report given to PACU RN and Post -op Vital signs reviewed and stable  Post vital signs:  Reviewed and stable  Last Vitals:  Vitals:   01/18/21 0631  BP: 127/74  Pulse: 70  Resp: 18  Temp: 36.7 C  SpO2: 811%    Complications: No apparent anesthesia complications

## 2021-01-18 NOTE — Op Note (Signed)
[facility] Patient Name: Alfred Clay Procedure Date: 01/18/2021 MRN: [id number] Attending MD: Gerrit Heck , MD Date of Birth: September 18, 1951 CSN: [id number] Age: 70 Admit Type: Outpatient Procedure:                Upper GI endoscopy Indications:              Dysphagia, For therapy of esophageal stenosis                           ?"EGD (06/2018, Dr. Alessandra Bevels): LA Grade C                            esophagitis (biopsy: Ulcerated mucosa with reflux                            changes), non-H. pylori gastritis. Normal duodenum                            with normal biopsies.                           ?"06/2017: Started PPI bid                           ?"EGD (09/2018, Dr. Alessandra Bevels): LA Grade C                            esophagitis, a few benign-appearing intrinsic                            moderate stenoses (biopsy: Increased lymphocytes                            concerning for lymphocytic esophagitis)                           ?"09/2018: ContinuedbidPPI, added Carafate with                            resolution of reflux symptoms                           ?"EGD (05/2019, Dr. Silverio Decamp): LA Grade C                            esophagitis 20?"22 cm from incisors, severe stenosis                            at 22 cm 8 mm diameter, not traversable.                            Recommended repeat at [facility] with pediatric endoscope                           ?"EGD (04/2019, Dr. Bryan Lemma): 2 stenoses located  in proximal esophagus (18 cm from incisors) and GEJ                            (37 cm from incisors). Dilated with 13 mm TTS                            balloon with mucosal rent. Moderately severe                            esophagitis mucosal friability throughout                            esophagus. Biopsies suggestive of lichenoid                            esophagitis. No evidence of EOE or lymphocytic                             esophagitis. 3 cm HH. Normal stomach and duodenum.                           -EGD (07/2019, Dr. Bryan Lemma): LA Grade C                            esophagitis, 2-3 cm HH, pan esophageal narrowing                            with severe stenosis at 18 and 40 cm from incisors,                            dilated with 8,9,11 mm Savary dilator with                            appropriate mucosal rent. Biopsies negative for                            EoE, LyE                           ?"EGD (08/2019, Dr. Gerber Penza):Narrowed esophagus                            with dominant stricture at 67 and 40 cm, dilated                            with 13 mm Savary. Esophagitis 24-40 cm, 2 cm HH.                            Bxs negative for LyE, EoE                           ?"EGD (09/2019, Dr. Bryan Lemma): Narrowedesophagus  with dominant stricture x2 at18 and 40 cm dilated                            with 14 mm Savary. 2 cm HH.                           ?" EGD (11/2020): Stenosis at 18 cm measuring 6 mm in                            diameter dilated with TTS balloon and injected with                            Kenalog. Stenosis at 40 cm measuring 9 mm in                            diameter dilated with 12 mm TTS balloon and                            injected with Kenalog. Providers:                Gerrit Heck, MD, Carmie End, RN, Tyna Jaksch Technician Referring MD:              Medicines:                Monitored Anesthesia Care Complications:            No immediate complications. Estimated Blood Loss:     Estimated blood loss was minimal. Procedure:                Pre-Anesthesia Assessment:                           - Prior to the procedure, a History and Physical                            was performed, and patient medications and                            allergies were reviewed. The patient's tolerance of                             previous anesthesia was also reviewed. The risks                            and benefits of the procedure and the sedation                            options and risks were discussed with the patient.                            All questions were answered, and informed consent  was obtained. Prior Anticoagulants: The patient has                            taken no previous anticoagulant or antiplatelet                            agents. ASA Grade Assessment: III - A patient with                            severe systemic disease. After reviewing the risks                            and benefits, the patient was deemed in                            satisfactory condition to undergo the procedure.                           After obtaining informed consent, the endoscope was                            passed under direct vision. Throughout the                            procedure, the patient's blood pressure, pulse, and                            oxygen saturations were monitored continuously. The                            [id number] Olympus gastroscope was                            introduced through the mouth, and advanced to the                            second part of duodenum. The upper GI endoscopy was                            accomplished without difficulty. The patient                            tolerated the procedure well. Scope In: Scope Out: Findings:      One benign-appearing, intrinsic moderate stenosis was found 18 cm from       the incisors. This stenosis measured 8 mm (inner diameter) x 1 cm (in       length). The stenosis was traversed wth gentle endoscope pressure. A TTS       dilator was passed through the scope. Dilation with an 05-04-09 mm balloon       and a 07-07-11 mm balloon dilator was performed to 12 mm. The balloon       was then deflated, and the endscope advanced into the distal esophagus.       The  balloon was reinflated to 12 mm  and dragged proximally through the       esophagus along with the endoscope and exited through the mouth. The       dilation site was examined and showed mild mucosal disruption and       moderate improvement in luminal narrowing. Area was successfully       injected with 2 mL of triamcinolone (20 mg/mL) in all 4 quadrants in 0.5       mL aliquots. Estimated blood loss was minimal.      One benign-appearing, intrinsic mild stenosis was found 40 cm from the       incisors. This stenosis measured 1 cm (in length). The stenosis was       traversed. A TTS dilator was passed through the scope. Dilation with a       15-16.5-18 mm balloon dilator was performed to 16.5 mm. The dilation       site was examined and showed moderate mucosal disruption and moderate       improvement in luminal narrowing. The balloon was then deflated, and the       endscope advanced into the distal esophagus. The balloon was reinflated       to 16.5 mm and dragged proximally through the esophagus, stopping and       deflating prior to the proximal stricture. Reinspection was notable for       an appropriate mucosal rent in the mid esophagus, consistent with       dilation of another mid esophageal stricture at 30 cm. Estimated blood       loss was minimal.      Esophagitis with no bleeding was found 24 to 40 cm from the incisors.       Biopsies were taken with a cold forceps for histology. Estimated blood       loss was minimal.      A 2 cm hiatal hernia was present.      The entire examined stomach was normal.      The examined duodenum was normal. Impression:               - Benign-appearing esophageal stenosis. Dilated.                            Injected.                           - Benign-appearing esophageal stenosis.                           - Esophagitis with no bleeding. Biopsied.                           - 2 cm hiatal hernia.                           - Normal stomach.                            - Normal examined duodenum. Moderate Sedation:      Not Applicable - Patient had care per Anesthesia. Recommendation:           - Patient has a contact number available for  emergencies. The signs and symptoms of potential                            delayed complications were discussed with the                            patient. Return to normal activities tomorrow.                            Written discharge instructions were provided to the                            patient.                           - Soft diet for 2 days.                           - Continue present medications.                           - Await pathology results.                           - Repeat upper endoscopy in 4-6 weeks for                            retreatment.                           - Continue Protonix and Carafate as prescribed. Procedure Code(s):        --- Professional ---                           [phone], Esophagogastroduodenoscopy, flexible,                            transoral; with transendoscopic balloon dilation of                            esophagus (less than 30 mm diameter)                           [id number], 59, Esophagogastroduodenoscopy, flexible,                            transoral; with directed submucosal injection(s),                            any substance                           [id number], 59, Esophagogastroduodenoscopy, flexible,                            transoral; with biopsy, single or multiple Diagnosis Code(s):        --- Professional ---  K22.2, Esophageal obstruction                           K20.90, Esophagitis, unspecified without bleeding                           K44.9, Diaphragmatic hernia without obstruction or                            gangrene                           R13.10, Dysphagia, unspecified CPT copyright 2019 American [employer]. All rights reserved. The codes documented in  this report are preliminary and upon coder review may  be revised to meet current compliance requirements. Gerrit Heck, MD 01/18/2021 8:28:13 AM Number of Addenda: 0

## 2021-01-18 NOTE — Anesthesia Preprocedure Evaluation (Signed)
Anesthesia Evaluation  Patient identified by MRN, date of birth, ID band Patient awake    Reviewed: Allergy & Precautions, NPO status , Patient's Chart, lab work & pertinent test results  Airway Mallampati: II       Dental  (+) Dental Advisory Given, Implants Permanent dental implants, dental advisory given:   Pulmonary    breath sounds clear to auscultation       Cardiovascular hypertension,  Rhythm:Regular Rate:Normal     Neuro/Psych    GI/Hepatic   Endo/Other  diabetes  Renal/GU      Musculoskeletal   Abdominal   Peds  Hematology   Anesthesia Other Findings   Reproductive/Obstetrics                             Anesthesia Physical Anesthesia Plan  ASA: III  Anesthesia Plan: MAC   Post-op Pain Management:    Induction: Intravenous  PONV Risk Score and Plan: Ondansetron and Propofol infusion  Airway Management Planned: Natural Airway and Nasal Cannula  Additional Equipment:   Intra-op Plan:   Post-operative Plan:   Informed Consent: I have reviewed the patients History and Physical, chart, labs and discussed the procedure including the risks, benefits and alternatives for the proposed anesthesia with the patient or authorized representative who has indicated his/her understanding and acceptance.     Dental advisory given  Plan Discussed with: Anesthesiologist  Anesthesia Plan Comments:         Anesthesia Quick Evaluation

## 2021-01-18 NOTE — Telephone Encounter (Signed)
Patient has been scheduled for a pre-visit with the nurse on Thursday, 02/04/21 at 1:30 PM. Pre-screening COVID test on Monday, 02/15/21 at 11:15 AM. Patient is scheduled for EGD with dilation at [facility] on Thursday, 02/18/21 at 7:30 AM, arriving at 83 AM.   Spoke with patient's wife and she is aware of all appointments. She verbalized understanding and had no concerns at the end of the call.  CASE ID: [id number]

## 2021-01-18 NOTE — Anesthesia Postprocedure Evaluation (Signed)
Anesthesia Post Note  Patient: MAURY GRONINGER  Procedure(s) Performed: ESOPHAGOGASTRODUODENOSCOPY (EGD) WITH PROPOFOL (N/A ) BALLOON DILATION (N/A ) SCLEROTHERAPY BIOPSY     Patient location during evaluation: Endoscopy Anesthesia Type: MAC Level of consciousness: awake and alert Pain management: pain level controlled Vital Signs Assessment: post-procedure vital signs reviewed and stable Respiratory status: spontaneous breathing, nonlabored ventilation, respiratory function stable and patient connected to nasal cannula oxygen Cardiovascular status: stable and blood pressure returned to baseline Postop Assessment: no apparent nausea or vomiting Anesthetic complications: no   No complications documented.  Last Vitals:  Vitals:   01/18/21 0830 01/18/21 0840  BP: 133/71 (!) 142/80  Pulse: 69 68  Resp: 13 18  Temp:    SpO2: 100% 100%    Last Pain:  Vitals:   01/18/21 0840  TempSrc:   PainSc: 0-No pain                 Kelsey Edman COKER

## 2021-01-18 NOTE — Discharge Instructions (Signed)
YOU HAD AN ENDOSCOPIC PROCEDURE TODAY: Refer to the procedure report and other information in the discharge instructions given to you for any specific questions about what was found during the examination. If this information does not answer your questions, please call [place] office at [phone] to clarify.   YOU SHOULD EXPECT: Some feelings of bloating in the abdomen. Passage of more gas than usual. Walking can help get rid of the air that was put into your GI tract during the procedure and reduce the bloating. If you had a lower endoscopy (such as a colonoscopy or flexible sigmoidoscopy) you may notice spotting of blood in your stool or on the toilet paper. Some abdominal soreness may be present for a day or two, also.  DIET: Your first meal following the procedure should be a light meal and then it is ok to progress to your normal diet. A half-sandwich or bowl of soup is an example of a good first meal. Heavy or fried foods are harder to digest and may make you feel nauseous or bloated. Drink plenty of fluids but you should avoid alcoholic beverages for 24 hours. If you had a esophageal dilation, please see attached instructions for diet.    ACTIVITY: Your care partner should take you home directly after the procedure. You should plan to take it easy, moving slowly for the rest of the day. You can resume normal activity the day after the procedure however YOU SHOULD NOT DRIVE, use power tools, machinery or perform tasks that involve climbing or major physical exertion for 24 hours (because of the sedation medicines used during the test).   SYMPTOMS TO REPORT IMMEDIATELY: A gastroenterologist can be reached at any hour. Please call [phone]  for any of the following symptoms:   Following upper endoscopy (EGD, EUS, ERCP, esophageal dilation) Vomiting of blood or coffee ground material  New, significant abdominal pain  New, significant chest pain or pain under the shoulder blades  Painful or  persistently difficult swallowing  New shortness of breath  Black, tarry-looking or red, bloody stools  FOLLOW UP:  If any biopsies were taken you will be contacted by phone or by letter within the next 1-3 weeks. Call [phone]  if you have not heard about the biopsies in 3 weeks.  Please also call with any specific questions about appointments or follow up tests.  

## 2021-01-18 NOTE — H&P (Signed)
P  Chief Complaint:    Dysphagia  GI History: 70 year old male with history of progressive dysphagia to solids over the last 2 years, without improvement with PPI, Carafate. Recent EGDs with severe proximal esophageal stenosis and esophagitis. Biopsies were concerning for lymphocytic esophagitis in 09/2018, and more recently suspicious for lichenoid esophagitis.   Endoscopic history: -EGD (06/2018, Dr. Alessandra Bevels): LA Grade C esophagitis (biopsy: Ulcerated mucosa with reflux changes), non-H. pylori gastritis. Normal duodenum with normal biopsies. -06/2017: Started PPI bid -EGD (09/2018, Dr. Alessandra Bevels): LA Grade C esophagitis, a few benign-appearing intrinsic moderate stenoses (biopsy: Increased lymphocytes concerning for lymphocytic esophagitis) -09/2018: ContinuedbidPPI, added Carafate with resolution of reflux symptoms -EGD (05/2019, Dr. Silverio Decamp): LA Grade C esophagitis 20-22 cm from incisors, severe stenosis at 22 cm 8 mm diameter, not traversable. Recommended repeat at [facility] with pediatric endoscope -EGD (04/2019, Dr. Bryan Lemma): 2 stenoses located in proximal esophagus (18 cm from incisors) and GEJ (37 cm from incisors). Dilated with 13 mm TTS balloon with mucosal rent. Moderately severe esophagitis mucosal friability throughout esophagus. Biopsies suggestive of lichenoid esophagitis. No evidence of EOE or lymphocytic esophagitis. 3 cm HH. Normal stomach and duodenum. -EGD (07/2019, Dr. Bryan Lemma): LA Grade C esophagitis, 2-3 cm HH, pan esophageal narrowing with severe stenosis at 18 and 40 cm from incisors, dilated with 8,9,11 mm Savary dilator with appropriate mucosal rent. Biopsies negative for EoE, LyE -EGD (08/2019, Dr. Amritha Yorke):Narrowed esophagus with dominant stricture at 45 and 40 cm, dilated with 13 mm Savary. Esophagitis 24-40 cm, 2 cm HH. Bxs negative for LyE, EoE -EGD (09/2019, Dr. Bryan Lemma): Narrowedesophagus with dominant stricture x2 at18 and 40 cm  dilated with 14 mm Savary. 2 cm HH. - EGD (11/2020): Stenosis at 18 cm measuring 6 mm in diameter dilated with TTS balloon and injected with Kenalog. Stenosis at 40 cm measuring 9 mm in diameter dilated with 12 mm TTS balloon and injected with Kenalog.   -Previously followed in [place] with EGD approximately7/2019 (GERD, dysphagia, odynophagia) with Candida Esophagitismay have shown fungal infection and H. pylori per patient. No reports available for review. -Colonoscopy (approximately 02/2018, [place]): Normal per patient  HPI:     Patient is a 70 y.o. male presenting to [facility] for repeat EGD with dilation, esophageal biopsies, and possible repeat Kenalog injection.  Last EGD in 11/2020 with stricture in proximal and distal esophagus, both dilated with TTS balloon and injected.  He did have some clinical improvement, but still ongoing dysphagia.  He presents today for repeat intervention.   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  Has now completed 6+ months of Brilinta/ASA 81 mg, and now maintained on ASA monotherapy.   Review of systems:     No chest pain, no SOB, no fevers, no urinary sx   Past Medical History:  Diagnosis Date  . Acquired esophageal ring   . Cancer ([place])    skin- upper back and lower back  . Cataract    bilateral-removed  . DM type 2 (diabetes mellitus, type 2) ([place])   . GERD (gastroesophageal reflux disease)   . History of esophagitis   . Hyperlipidemia    no meds  . Hypertension    no meds    Patient's surgical history, family medical history, social history, medications and allergies were all reviewed in Epic    Current Facility-Administered Medications  Medication Dose Route Frequency Provider Last Rate Last Admin  . 0.9 %  sodium chloride infusion   Intravenous Continuous Darsha Zumstein  V, DO      . lactated ringers infusion   Intravenous Continuous Michaele Amundson V, DO      . lactated ringers infusion    Intravenous Continuous Noella Kipnis V, DO        Physical Exam:     BP 127/74   Pulse 70   Temp 98 F (36.7 C) (Oral)   Resp 18   Ht 5\' 11"  (1.803 m)   Wt 72.6 kg   SpO2 100%   BMI 22.32 kg/m   GENERAL:  Pleasant male in NAD PSYCH: : Cooperative, normal affect EENT:  conjunctiva pink, mucous membranes moist, neck supple without masses CARDIAC:  RRR, no murmur heard, no peripheral edema PULM: Normal respiratory effort, lungs CTA bilaterally, no wheezing ABDOMEN:  Nondistended, soft, nontender. No obvious masses, no hepatomegaly,  normal bowel sounds SKIN:  turgor, no lesions seen Musculoskeletal:  Normal muscle tone, normal strength NEURO: Alert and oriented x 3, no focal neurologic deficits   IMPRESSION and PLAN:    1)Dysphagia 2) Esophageal stricture 3) History of esophagitis  -Repeat [facility] todaywith esophageal dilation and repeat esophageal biopsies to again rule out EOE, lymphocytic esophagitis.  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

## 2021-01-19 ENCOUNTER — Encounter (HOSPITAL_COMMUNITY): Payer: Self-pay | Admitting: Gastroenterology

## 2021-01-20 LAB — SURGICAL PATHOLOGY

## 2021-01-25 ENCOUNTER — Encounter: Payer: Self-pay | Admitting: Gastroenterology

## 2021-02-04 ENCOUNTER — Other Ambulatory Visit: Payer: Self-pay

## 2021-02-04 ENCOUNTER — Ambulatory Visit (AMBULATORY_SURGERY_CENTER): Payer: Self-pay

## 2021-02-04 VITALS — Ht 71.0 in | Wt 163.0 lb

## 2021-02-04 DIAGNOSIS — R1319 Other dysphagia: Secondary | ICD-10-CM

## 2021-02-04 DIAGNOSIS — K222 Esophageal obstruction: Secondary | ICD-10-CM

## 2021-02-04 NOTE — Progress Notes (Signed)
No egg or soy allergy known to patient  No issues with past sedation with any surgeries or procedures Patient denies ever being told they had issues or difficulty with intubation  No FH of Malignant Hyperthermia No diet pills per patient No home 02 use per patient  No blood thinners per patient  Pt denies issues with constipation  No A fib or A flutter  EMMI video via MyChart  COVID 19 guidelines implemented in [place] today with Pt and RN  Pt is fully vaccinated for Covid x 2 + booster; Due to the COVID-19 pandemic we are asking patients to follow certain guidelines.  Pt aware of COVID protocols and [facility] guidelines

## 2021-02-15 ENCOUNTER — Other Ambulatory Visit: Payer: Self-pay

## 2021-02-15 ENCOUNTER — Encounter (HOSPITAL_COMMUNITY): Payer: Self-pay | Admitting: Gastroenterology

## 2021-02-15 ENCOUNTER — Other Ambulatory Visit (HOSPITAL_COMMUNITY)
Admission: RE | Admit: 2021-02-15 | Discharge: 2021-02-15 | Disposition: A | Discharge status: Home / Self Care | Payer: Medicare Other | Source: Ambulatory Visit | Attending: Gastroenterology | Admitting: Gastroenterology

## 2021-02-15 DIAGNOSIS — Z20822 Contact with and (suspected) exposure to covid-19: Secondary | ICD-10-CM | POA: Diagnosis not present

## 2021-02-15 DIAGNOSIS — Z01812 Encounter for preprocedural laboratory examination: Secondary | ICD-10-CM | POA: Insufficient documentation

## 2021-02-15 LAB — SARS CORONAVIRUS 2 (TAT 6-24 HRS): SARS Coronavirus 2: NEGATIVE

## 2021-02-18 ENCOUNTER — Ambulatory Visit (HOSPITAL_COMMUNITY): Payer: Medicare Other | Admitting: Certified Registered Nurse Anesthetist

## 2021-02-18 ENCOUNTER — Encounter (HOSPITAL_COMMUNITY): Payer: Self-pay | Admitting: Gastroenterology

## 2021-02-18 ENCOUNTER — Ambulatory Visit (HOSPITAL_COMMUNITY)
Admission: RE | Admit: 2021-02-18 | Discharge: 2021-02-18 | Disposition: A | Discharge status: Home / Self Care | Payer: Medicare Other | Attending: Gastroenterology | Admitting: Gastroenterology

## 2021-02-18 ENCOUNTER — Encounter (HOSPITAL_COMMUNITY)
Admission: RE | Disposition: A | Discharge status: Home / Self Care | Payer: Self-pay | Source: Home / Self Care | Attending: Gastroenterology

## 2021-02-18 ENCOUNTER — Other Ambulatory Visit: Payer: Self-pay

## 2021-02-18 DIAGNOSIS — R1310 Dysphagia, unspecified: Secondary | ICD-10-CM | POA: Diagnosis present

## 2021-02-18 DIAGNOSIS — E119 Type 2 diabetes mellitus without complications: Secondary | ICD-10-CM | POA: Diagnosis not present

## 2021-02-18 DIAGNOSIS — K449 Diaphragmatic hernia without obstruction or gangrene: Secondary | ICD-10-CM | POA: Insufficient documentation

## 2021-02-18 DIAGNOSIS — R1319 Other dysphagia: Secondary | ICD-10-CM | POA: Diagnosis not present

## 2021-02-18 DIAGNOSIS — K222 Esophageal obstruction: Secondary | ICD-10-CM

## 2021-02-18 DIAGNOSIS — K219 Gastro-esophageal reflux disease without esophagitis: Secondary | ICD-10-CM | POA: Diagnosis not present

## 2021-02-18 DIAGNOSIS — I10 Essential (primary) hypertension: Secondary | ICD-10-CM | POA: Insufficient documentation

## 2021-02-18 HISTORY — PX: ESOPHAGOGASTRODUODENOSCOPY (EGD) WITH PROPOFOL: SHX5813

## 2021-02-18 HISTORY — PX: BALLOON DILATION: SHX5330

## 2021-02-18 LAB — GLUCOSE, CAPILLARY: Glucose-Capillary: 142 mg/dL — ABNORMAL HIGH (ref 70–99)

## 2021-02-18 SURGERY — ESOPHAGOGASTRODUODENOSCOPY (EGD) WITH PROPOFOL
Anesthesia: Monitor Anesthesia Care

## 2021-02-18 MED ORDER — PROPOFOL 1000 MG/100ML IV EMUL
INTRAVENOUS | Status: AC
  Filled 2021-02-18: qty 100

## 2021-02-18 MED ORDER — SODIUM CHLORIDE 0.9 % IV SOLN: INTRAVENOUS | Status: DC

## 2021-02-18 MED ORDER — LIDOCAINE HCL (CARDIAC) PF 100 MG/5ML IV SOSY
PREFILLED_SYRINGE | INTRAVENOUS | Status: DC | PRN
  Administered 2021-02-18: 100 mg via INTRAVENOUS

## 2021-02-18 MED ORDER — PROPOFOL 500 MG/50ML IV EMUL
INTRAVENOUS | Status: AC
  Filled 2021-02-18: qty 50

## 2021-02-18 MED ORDER — PROPOFOL 10 MG/ML IV BOLUS
INTRAVENOUS | Status: DC | PRN
  Administered 2021-02-18: 20 mg via INTRAVENOUS
  Administered 2021-02-18: 40 mg via INTRAVENOUS
  Administered 2021-02-18 (×3): 20 mg via INTRAVENOUS

## 2021-02-18 MED ORDER — PROPOFOL 500 MG/50ML IV EMUL
INTRAVENOUS | Status: DC | PRN
  Administered 2021-02-18: 125 ug/kg/min via INTRAVENOUS

## 2021-02-18 MED ORDER — LACTATED RINGERS IV SOLN
INTRAVENOUS | Status: AC | PRN
  Administered 2021-02-18: 20 mL/h via INTRAVENOUS

## 2021-02-18 SURGICAL SUPPLY — 14 items

## 2021-02-18 NOTE — Discharge Instructions (Signed)
YOU HAD AN ENDOSCOPIC PROCEDURE TODAY: Refer to the procedure report and other information in the discharge instructions given to you for any specific questions about what was found during the examination. If this information does not answer your questions, please call [place] office at [phone] to clarify.   YOU SHOULD EXPECT: Some feelings of bloating in the abdomen. Passage of more gas than usual. Walking can help get rid of the air that was put into your GI tract during the procedure and reduce the bloating. If you had a lower endoscopy (such as a colonoscopy or flexible sigmoidoscopy) you may notice spotting of blood in your stool or on the toilet paper. Some abdominal soreness may be present for a day or two, also.  DIET: Your first meal following the procedure should be a light meal and then it is ok to progress to your normal diet. A half-sandwich or bowl of soup is an example of a good first meal. Heavy or fried foods are harder to digest and may make you feel nauseous or bloated. Drink plenty of fluids but you should avoid alcoholic beverages for 24 hours. If you had a esophageal dilation, please see attached instructions for diet.    ACTIVITY: Your care partner should take you home directly after the procedure. You should plan to take it easy, moving slowly for the rest of the day. You can resume normal activity the day after the procedure however YOU SHOULD NOT DRIVE, use power tools, machinery or perform tasks that involve climbing or major physical exertion for 24 hours (because of the sedation medicines used during the test).   SYMPTOMS TO REPORT IMMEDIATELY: A gastroenterologist can be reached at any hour. Please call [phone]  for any of the following symptoms:   Following upper endoscopy (EGD, EUS, ERCP, esophageal dilation) Vomiting of blood or coffee ground material  New, significant abdominal pain  New, significant chest pain or pain under the shoulder blades  Painful or  persistently difficult swallowing  New shortness of breath  Black, tarry-looking or red, bloody stools  FOLLOW UP:  If any biopsies were taken you will be contacted by phone or by letter within the next 1-3 weeks. Call [phone]  if you have not heard about the biopsies in 3 weeks.  Please also call with any specific questions about appointments or follow up tests.  

## 2021-02-18 NOTE — Op Note (Signed)
[facility] Patient Name: Alfred Clay Procedure Date: 02/18/2021 MRN: [id number] Attending MD: Gerrit Heck , MD Date of Birth: 12-Jun-1951 CSN: [id number] Age: 70 Admit Type: Outpatient Procedure:                Upper GI endoscopy Indications:              Dysphagia, Follow-up of esophageal stricture, For                            therapy of esophageal stricture                           He has had multiple upper endoscopies in the recent                            past to include EGD in 06/2018, 09/2018, 04/2019,                            05/2019, 07/2019, 08/2019, 09/2019, 11/2020, and most                            recently 12/2020 as below.                           ?" EGD (12/2020): Stenosis at 18 cm dilated with 12                            mm TTS balloon and injected with Kenalog. Stenosis                            at 40 cm dilated with 16.5 mm TTS balloon and                            dilation of mid esophageal stricture at 30 cm with                            16.5 mm balloon. Esophagitis. 2 cm HH Providers:                Gerrit Heck, MD, Grace Isaac, RN, Tyna Jaksch Technician Referring MD:              Medicines:                Monitored Anesthesia Care Complications:            No immediate complications. Estimated Blood Loss:     Estimated blood loss was minimal. Procedure:                Pre-Anesthesia Assessment:                           - Prior to the procedure, a History and Physical  was performed, and patient medications and                            allergies were reviewed. The patient's tolerance of                            previous anesthesia was also reviewed. The risks                            and benefits of the procedure and the sedation                            options and risks were discussed with the patient.                            All questions were answered,  and informed consent                            was obtained. Prior Anticoagulants: The patient has                            taken no previous anticoagulant or antiplatelet                            agents. ASA Grade Assessment: II - A patient with                            mild systemic disease. After reviewing the risks                            and benefits, the patient was deemed in                            satisfactory condition to undergo the procedure.                           After obtaining informed consent, the endoscope was                            passed under direct vision. Throughout the                            procedure, the patient's blood pressure, pulse, and                            oxygen saturations were monitored continuously. The                            [id number]-[id number] ([id number]) was introduced through the                            mouth, and advanced to the second part of duodenum.  The upper GI endoscopy was accomplished without                            difficulty. The patient tolerated the procedure                            well. Scope In: Scope Out: Findings:      Two benign-appearing, intrinsic stenoses were found at 30 cm and 40 cm       from the incisors. The stenoses were traversed. A TTS dilator was passed       through the scope. Dilation with a 15-16.5-18 mm balloon dilator was       performed to 15 mm. The dilation site was examined and showed moderate       mucosal disruption with 4 mucosal rents at 40 cm and 3 mucosal rents at       30 cm with moderate improvement in luminal narrowing, all consistent       with successful dilation. The previously noted proximal stricture at 18       cm was not readily seen, but when the inflated 15 mm balloon was dragged       proximally thorugh the esophagus, a small mucosal rent at 18 cm was       noted, consistent with dilation of a proximal stricture again. Estimated        blood loss was minimal. Overall, the esophageal lumen was not very       compliant and with reduced motion noted endoscopically.      A 2 cm hiatal hernia was present.      The entire examined stomach was normal.      The examined duodenum was normal. Impression:               - Benign-appearing esophageal stenoses. Dilated                            with 15 mm TTS balloon with appropriate mucosal                            rents at 40, 30 and 18 cm consistent with                            successful dilation.                           - 2 cm hiatal hernia.                           - Normal stomach.                           - Normal examined duodenum.                           - No specimens collected. Moderate Sedation:      Not Applicable - Patient had care per Anesthesia. Recommendation:           - Patient has a contact number available for  emergencies. The signs and symptoms of potential                            delayed complications were discussed with the                            patient. Return to normal activities tomorrow.                            Written discharge instructions were provided to the                            patient.                           - Soft diet for 2 days.                           - Continue present medications.                           - Repeat upper endoscopy in 3 months for                            retreatment and for surveillance.                           - Return to GI clinic at appointment to be                            scheduled. Procedure Code(s):        --- Professional ---                           [phone], Esophagogastroduodenoscopy, flexible,                            transoral; with transendoscopic balloon dilation of                            esophagus (less than 30 mm diameter) Diagnosis Code(s):        --- Professional ---                           K22.2, Esophageal obstruction                            K44.9, Diaphragmatic hernia without obstruction or                            gangrene                           R13.10, Dysphagia, unspecified CPT copyright 2019 [employer] Association. All rights reserved. The codes documented in this report are preliminary and upon coder review may  be revised to meet current compliance requirements. Gerrit Heck, MD 02/18/2021 8:14:58 AM Number  of Addenda: 0

## 2021-02-18 NOTE — Transfer of Care (Signed)
Immediate Anesthesia Transfer of Care Note  Patient: Alfred Clay  Procedure(s) Performed: ESOPHAGOGASTRODUODENOSCOPY (EGD) WITH PROPOFOL (N/A ) BALLOON DILATION (N/A )  Patient Location: Endoscopy Unit  Anesthesia Type:MAC  Level of Consciousness: awake and patient cooperative  Airway & Oxygen Therapy: Patient Spontanous Breathing and Patient connected to face mask oxygen  Post-op Assessment: Report given to RN and Post -op Vital signs reviewed and stable  Post vital signs: Reviewed and stable  Last Vitals:  Vitals Value Taken Time  BP 120/67 0805  Temp    Pulse 70   Resp    SpO2 100%     Last Pain:  Vitals:   02/18/21 0714  TempSrc: Oral  PainSc: 0-No pain         Complications: No complications documented.

## 2021-02-18 NOTE — H&P (Signed)
P  Chief Complaint:    Dysphagia  GI History: 70 year old male with history of progressive dysphagia to solids over the last 2 years, without improvement with PPI, Carafate. Recent EGDs with severe proximal esophageal stenosis and esophagitis. Biopsies were concerning for lymphocytic esophagitis in 09/2018, and more recently suspicious for lichenoid esophagitis.   Endoscopic history: -EGD (06/2018, Dr. Alessandra Bevels): LA Grade C esophagitis (biopsy: Ulcerated mucosa with reflux changes), non-H. pylori gastritis. Normal duodenum with normal biopsies. -06/2017: Started PPI bid -EGD (09/2018, Dr. Alessandra Bevels): LA Grade C esophagitis, a few benign-appearing intrinsic moderate stenoses (biopsy: Increased lymphocytes concerning for lymphocytic esophagitis) -09/2018: ContinuedbidPPI, added Carafate with resolution of reflux symptoms -EGD (05/2019, Dr. Silverio Decamp): LA Grade C esophagitis 20-22 cm from incisors, severe stenosis at 22 cm 8 mm diameter, not traversable. Recommended repeat at [facility] with pediatric endoscope -EGD (04/2019, Dr. Bryan Lemma): 2 stenoses located in proximal esophagus (18 cm from incisors) and GEJ (37 cm from incisors). Dilated with 13 mm TTS balloon with mucosal rent. Moderately severe esophagitis mucosal friability throughout esophagus. Biopsies suggestive of lichenoid esophagitis. No evidence of EOE or lymphocytic esophagitis. 3 cm HH. Normal stomach and duodenum. -EGD (07/2019, Dr. Bryan Lemma): LA Grade C esophagitis, 2-3 cm HH, pan esophageal narrowing with severe stenosis at 18 and 40 cm from incisors, dilated with 8,9,11 mm Savary dilator with appropriate mucosal rent. Biopsies negative for EoE, LyE -EGD (08/2019, Dr. Gerre Ranum):Narrowed esophagus with dominant stricture at 21 and 40 cm, dilated with 13 mm Savary. Esophagitis 24-40 cm, 2 cm HH. Bxs negative for LyE, EoE -EGD (09/2019, Dr. Bryan Lemma): Narrowedesophagus with dominant stricture x2 at18 and 40 cm  dilated with 14 mm Savary. 2 cm HH. - EGD (11/2020): Stenosis at 18 cm measuring 6 mm in diameter dilated with TTS balloon and injected with Kenalog. Stenosis at 40 cm measuring 9 mm in diameter dilated with 12 mm TTS balloon and injected with Kenalog. - EGD (12/2020): Stenosis at 18 cm dilated with 12 mm TTS balloon and injected with Kenalog.  Stenosis at 40 cm dilated with 16.5 mm TTS balloon and dilation of mid esophageal stricture at 30 cm with 16.5 mm balloon.  Esophagitis.  2 cm HH   -Previously followed in [place] with EGD approximately7/2019 (GERD, dysphagia, odynophagia) with Candida Esophagitismay have shown fungal infection and H. pylori per patient. No reports available for review. -Colonoscopy (approximately 02/2018, [place]): Normal per patient  HPI:     Patient is a 70 y.o. male presenting to [facility] long hospital Endoscopy Unit for repeat upper endoscopy with esophageal dilation.  Dysphagia continues to improve with serial dilations.  Review of systems:     No chest pain, no SOB, no fevers, no urinary sx   Past Medical History:  Diagnosis Date  . Acquired esophageal ring   . Asthma    uses inhaler if needed  . Cancer ([place])    skin- upper back and lower back  . Cataract    bilateral-removed  . DM type 2 (diabetes mellitus, type 2) ([place])   . GERD (gastroesophageal reflux disease)    on meds  . History of esophagitis   . Hyperlipidemia    on meds  . Hypertension    on meds    Patient's surgical history, family medical history, social history, medications and allergies were all reviewed in Epic    Current Facility-Administered Medications  Medication Dose Route Frequency Provider Last Rate Last Admin  . 0.9 %  sodium chloride infusion   Intravenous Continuous Antrell Tipler,  Grant Swager V, DO      . lactated ringers infusion    Continuous PRN Omolola Mittman V, DO 20 mL/hr at 02/18/21 0727 20 mL/hr at 02/18/21 0727   Facility-Administered Medications Ordered in Other  Encounters  Medication Dose Route Frequency Provider Last Rate Last Admin  . lidocaine (cardiac) 100 mg/60mL (XYLOCAINE) injection 2%   Intravenous Anesthesia Intra-op Raenette Rover, CRNA   100 mg at 02/18/21 0744    Physical Exam:     BP (!) 144/85   Pulse 63   Temp 98.1 F (36.7 C) (Oral)   Resp 15   Ht 5\' 11"  (1.803 m)   Wt 72.6 kg   SpO2 100%   BMI 22.32 kg/m   GENERAL:  Pleasant male in NAD PSYCH: : Cooperative, normal affect EENT:  conjunctiva pink, mucous membranes moist, neck supple without masses CARDIAC:  RRR, no murmur heard, no peripheral edema PULM: Normal respiratory effort, lungs CTA bilaterally, no wheezing ABDOMEN:  Nondistended, soft, nontender. No obvious masses, no hepatomegaly,  normal bowel sounds SKIN:  turgor, no lesions seen Musculoskeletal:  Normal muscle tone, normal strength NEURO: Alert and oriented x 3, no focal neurologic deficits   IMPRESSION and PLAN:    1) Dysphagia 2) Esophageal stricture - Repeat upper endoscopy with serial dilation today  3) GERD with erosive esophagitis - Continue high-dose PPI and Carafate          Akbar Sacra V Joyanna Kleman ,DO, FACG 02/18/2021, 7:45 AM

## 2021-02-18 NOTE — Anesthesia Postprocedure Evaluation (Signed)
Anesthesia Post Note  Patient: BUEL MOLDER  Procedure(s) Performed: ESOPHAGOGASTRODUODENOSCOPY (EGD) WITH PROPOFOL (N/A ) BALLOON DILATION (N/A )     Patient location during evaluation: PACU Anesthesia Type: MAC Level of consciousness: awake and alert Pain management: pain level controlled Vital Signs Assessment: post-procedure vital signs reviewed and stable Respiratory status: spontaneous breathing, nonlabored ventilation, respiratory function stable and patient connected to nasal cannula oxygen Cardiovascular status: stable and blood pressure returned to baseline Postop Assessment: no apparent nausea or vomiting Anesthetic complications: no   No complications documented.  Last Vitals:  Vitals:   02/18/21 0810 02/18/21 0820  BP: 122/66 136/72  Pulse: 62 (!) 59  Resp: 13 15  Temp:    SpO2: 100% 98%    Last Pain:  Vitals:   02/18/21 0820  TempSrc:   PainSc: 0-No pain                 Delancey Moraes S

## 2021-02-18 NOTE — Anesthesia Preprocedure Evaluation (Signed)
Anesthesia Evaluation  Patient identified by MRN, date of birth, ID band Patient awake    Reviewed: Allergy & Precautions, NPO status , Patient's Chart, lab work & pertinent test results  Airway Mallampati: II  TM Distance: >3 FB Neck ROM: Full    Dental no notable dental hx.    Pulmonary asthma ,    Pulmonary exam normal breath sounds clear to auscultation       Cardiovascular hypertension, + CAD, + Past MI and + Cardiac Stents  Normal cardiovascular exam Rhythm:Regular Rate:Normal     Neuro/Psych negative neurological ROS  negative psych ROS   GI/Hepatic Neg liver ROS, GERD  Medicated,  Endo/Other  diabetes, Type 2  Renal/GU negative Renal ROS  negative genitourinary   Musculoskeletal negative musculoskeletal ROS (+)   Abdominal   Peds negative pediatric ROS (+)  Hematology negative hematology ROS (+)   Anesthesia Other Findings   Reproductive/Obstetrics negative OB ROS                             Anesthesia Physical Anesthesia Plan  ASA: III  Anesthesia Plan: MAC   Post-op Pain Management:    Induction: Intravenous  PONV Risk Score and Plan: 1 and Propofol infusion and Treatment may vary due to age or medical condition  Airway Management Planned: Simple Face Mask  Additional Equipment:   Intra-op Plan:   Post-operative Plan:   Informed Consent: I have reviewed the patients History and Physical, chart, labs and discussed the procedure including the risks, benefits and alternatives for the proposed anesthesia with the patient or authorized representative who has indicated his/her understanding and acceptance.     Dental advisory given  Plan Discussed with: CRNA and Surgeon  Anesthesia Plan Comments:         Anesthesia Quick Evaluation

## 2021-02-18 NOTE — Interval H&P Note (Signed)
History and Physical Interval Note:  02/18/2021 7:48 AM  Alfred Clay  has presented today for surgery, with the diagnosis of dysphagia.  The various methods of treatment have been discussed with the patient and family. After consideration of risks, benefits and other options for treatment, the patient has consented to  Procedure(s): ESOPHAGOGASTRODUODENOSCOPY (EGD) WITH PROPOFOL (N/A) BALLOON DILATION (N/A) as a surgical intervention.  The patient's history has been reviewed, patient examined, no change in status, stable for surgery.  I have reviewed the patient's chart and labs.  Questions were answered to the patient's satisfaction.     [name] Abdulahi Schor

## 2021-02-21 ENCOUNTER — Encounter (HOSPITAL_COMMUNITY): Payer: Self-pay | Admitting: Gastroenterology

## 2021-03-02 ENCOUNTER — Other Ambulatory Visit: Payer: Self-pay

## 2021-03-02 ENCOUNTER — Encounter: Payer: Self-pay | Admitting: Physician Assistant

## 2021-03-02 ENCOUNTER — Ambulatory Visit (INDEPENDENT_AMBULATORY_CARE_PROVIDER_SITE_OTHER): Payer: Medicare Other | Admitting: Physician Assistant

## 2021-03-02 DIAGNOSIS — L570 Actinic keratosis: Secondary | ICD-10-CM | POA: Diagnosis not present

## 2021-03-02 DIAGNOSIS — Z85828 Personal history of other malignant neoplasm of skin: Secondary | ICD-10-CM | POA: Diagnosis not present

## 2021-03-02 DIAGNOSIS — B356 Tinea cruris: Secondary | ICD-10-CM | POA: Diagnosis not present

## 2021-03-02 DIAGNOSIS — Z1283 Encounter for screening for malignant neoplasm of skin: Secondary | ICD-10-CM | POA: Diagnosis not present

## 2021-03-02 DIAGNOSIS — L720 Epidermal cyst: Secondary | ICD-10-CM

## 2021-03-02 DIAGNOSIS — I255 Ischemic cardiomyopathy: Secondary | ICD-10-CM | POA: Diagnosis not present

## 2021-03-02 DIAGNOSIS — D485 Neoplasm of uncertain behavior of skin: Secondary | ICD-10-CM

## 2021-03-02 MED ORDER — KETOCONAZOLE 2 % EX CREA
1.0000 | TOPICAL_CREAM | Freq: Two times a day (BID) | CUTANEOUS | 10 refills | Status: AC
Start: 2021-03-02 — End: 2021-04-01

## 2021-03-02 NOTE — Patient Instructions (Signed)

## 2021-03-02 NOTE — Progress Notes (Signed)
   New Patient   Subjective  Alfred Clay is a 70 y.o. male who presents for the following: New Patient (Initial Visit) (Patient here today for skin check, no new concerns. Per patient he does have a personal history of non mole skin cancer. No personal history of atypical moles or melanoma. No family history of atypical moles, melanoma or non mole skin cancer. ). No records were received from patient regarding specific skin cancer information.   The following portions of the chart were reviewed this encounter and updated as appropriate:  Tobacco  Allergies  Meds  Problems  Med Hx  Surg Hx  Fam Hx      Objective  Well appearing patient in no apparent distress; mood and affect are within normal limits.  A full examination was performed including scalp, head, eyes, ears, nose, lips, neck, chest, axillae, abdomen, back, buttocks, bilateral upper extremities, bilateral lower extremities, hands, feet, fingers, toes, fingernails, and toenails. All findings within normal limits unless otherwise noted below.  Objective  buttocks and genital area: Erythema and scale  Objective  Right Buccal Cheek (2): Erythematous patches with gritty scale.  Objective  Left Upper Back: Thick pink crust on a nodule      Assessment & Plan  Tinea cruris buttocks and genital area  ketoconazole (NIZORAL) 2 % cream - buttocks and genital area  AK (actinic keratosis) (2) Right Buccal Cheek  Destruction of lesion - Right Buccal Cheek Complexity: simple   Destruction method: cryotherapy   Informed consent: discussed and consent obtained   Timeout:  patient name, date of birth, surgical site, and procedure verified Lesion destroyed using liquid nitrogen: Yes   Cryotherapy cycles:  3 Outcome: patient tolerated procedure well with no complications    Neoplasm of uncertain behavior of skin Left Upper Back  Skin / nail biopsy Type of biopsy: tangential   Informed consent: discussed and  consent obtained   Timeout: patient name, date of birth, surgical site, and procedure verified   Procedure prep:  Patient was prepped and draped in usual sterile fashion (Non sterile) Prep type:  Chlorhexidine Anesthesia: the lesion was anesthetized in a standard fashion   Anesthetic:  1% lidocaine w/ epinephrine 1-100,000 local infiltration Instrument used: flexible razor blade   Outcome: patient tolerated procedure well   Post-procedure details: wound care instructions given    Specimen 1 - Surgical pathology Differential Diagnosis: bcc vs scc, cyst  Check Margins: yes     I, Mickelle Goupil, PA-C, have reviewed all documentation for this visit. The documentation on 03/02/21 for the exam, diagnosis, procedures, and orders are all accurate and complete.

## 2021-04-05 ENCOUNTER — Other Ambulatory Visit: Payer: Self-pay

## 2021-04-05 ENCOUNTER — Ambulatory Visit (INDEPENDENT_AMBULATORY_CARE_PROVIDER_SITE_OTHER): Payer: Medicare Other | Admitting: Family Medicine

## 2021-04-05 ENCOUNTER — Encounter: Payer: Self-pay | Admitting: Family Medicine

## 2021-04-05 VITALS — BP 118/68 | HR 79 | Temp 98.2°F | Ht 71.0 in | Wt 159.0 lb

## 2021-04-05 DIAGNOSIS — I5022 Chronic systolic (congestive) heart failure: Secondary | ICD-10-CM | POA: Diagnosis not present

## 2021-04-05 DIAGNOSIS — J4530 Mild persistent asthma, uncomplicated: Secondary | ICD-10-CM | POA: Diagnosis not present

## 2021-04-05 DIAGNOSIS — R053 Chronic cough: Secondary | ICD-10-CM

## 2021-04-05 DIAGNOSIS — E1165 Type 2 diabetes mellitus with hyperglycemia: Secondary | ICD-10-CM

## 2021-04-05 DIAGNOSIS — I25119 Atherosclerotic heart disease of native coronary artery with unspecified angina pectoris: Secondary | ICD-10-CM | POA: Diagnosis not present

## 2021-04-05 MED ORDER — FLUTICASONE PROPIONATE HFA 110 MCG/ACT IN AERO: 2.0000 | INHALATION_SPRAY | Freq: Two times a day (BID) | RESPIRATORY_TRACT | 2 refills | Status: DC

## 2021-04-05 NOTE — Patient Instructions (Addendum)
Give [place] 2-3 business days to get the results of your labs back.   Keep the diet clean and stay active.  Please consider getting your 2nd Covid vaccine booster.  The new Shingrix vaccine (for shingles) is a 2 shot series. It can make people feel low energy, achy and almost like they have the flu for 48 hours after injection. Please plan accordingly when deciding on when to get this shot. Call your pharmacy for an appointment to get this. The second shot of the series is less severe regarding the side effects, but it still lasts 48 hours.   Let us know if you need anything.

## 2021-04-05 NOTE — Progress Notes (Signed)
Subjective:   Chief Complaint  Patient presents with   Follow-up    Alfred Clay is a 70 y.o. male here for follow-up of diabetes.   [name]'s self monitored glucose range is mid-high 100's.  Patient denies hypoglycemic reactions. He checks his glucose levels 2 time(s) per day. Patient does require insulin.   Medications include: Metformin 500 mg bid Diet is fair.  Exercise: walking No Cp or SOB.  Asthma Using Flovent bid. Rinses mouth out after use. He has had a cough for 3 years that has not improved. He thinks the Flovent does help as well as albuterol. Rarely uses the SABA. No SOB, rare wheezing at night.   Past Medical History:  Diagnosis Date   Acquired esophageal ring    Asthma    uses inhaler if needed   Cancer ([place])    skin- upper back and lower back   Cataract    bilateral-removed   DM type 2 (diabetes mellitus, type 2) (HCC)    GERD (gastroesophageal reflux disease)    on meds   History of esophagitis    Hyperlipidemia    on meds   Hypertension    on meds     Related testing: Retinal exam: Done Pneumovax: done  Objective:  BP 118/68   Pulse 79   Temp 98.2 F (36.8 C) (Oral)   Ht 5\' 11"  (1.803 m)   Wt 159 lb (72.1 kg)   SpO2 99%   BMI 22.18 kg/m  General:  Well developed, well nourished, in no apparent distress Eyes:  Pupils equal and round, sclera anicteric without injection  Lungs:  +TUA noises, +rhonchi, no wheezing, no access msc use Cardio:  RRR, no bruits, no LE edema Psych: Age appropriate judgment and insight  Assessment:   Type 2 diabetes mellitus with hyperglycemia, without long-term current use of insulin (HCC) - Plan: Comprehensive metabolic panel, Lipid panel, Hemoglobin A1c  Mild persistent asthma without complication - Plan: fluticasone (FLOVENT HFA) 110 MCG/ACT inhaler  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  Check above labs.  Continue metformin 500 mg twice daily.  Counseled on diet and exercise. Chronic, unstable.  Needs to start taking Flovent daily, continue to rinse mouth out after use.  Continue albuterol as needed.  Coughing would be a reason to use F/u in 1 mo to recheck cough/asthma.  Could add Singulair or perhaps a long-acting beta agonist. The patient voiced understanding and agreement to the plan.  [place], DO 04/05/21 10:37 AM

## 2021-04-05 NOTE — Addendum Note (Signed)
Addended by: Kelle Darting A on: 04/05/2021 02:00 PM   Modules accepted: Orders

## 2021-04-06 ENCOUNTER — Other Ambulatory Visit: Payer: Self-pay | Admitting: Family Medicine

## 2021-04-06 ENCOUNTER — Other Ambulatory Visit (INDEPENDENT_AMBULATORY_CARE_PROVIDER_SITE_OTHER): Payer: Medicare Other

## 2021-04-06 DIAGNOSIS — E1165 Type 2 diabetes mellitus with hyperglycemia: Secondary | ICD-10-CM | POA: Diagnosis not present

## 2021-04-06 DIAGNOSIS — E875 Hyperkalemia: Secondary | ICD-10-CM

## 2021-04-06 LAB — LIPID PANEL
Cholesterol: 159 mg/dL (ref 0–200)
HDL: 48 mg/dL (ref >39.00)
LDL Cholesterol: 100 mg/dL — ABNORMAL HIGH (ref 0–99)
NonHDL: 110.79
Total CHOL/HDL Ratio: 3
Triglycerides: 54 mg/dL (ref 0.0–149.0)
VLDL: 10.8 mg/dL (ref 0.0–40.0)

## 2021-04-06 LAB — COMPREHENSIVE METABOLIC PANEL
ALT: 12 U/L (ref 0–53)
AST: 14 U/L (ref 0–37)
Albumin: 3.9 g/dL (ref 3.5–5.2)
Alkaline Phosphatase: 74 U/L (ref 39–117)
BUN: 19 mg/dL (ref 6–23)
CO2: 27 mEq/L (ref 19–32)
Calcium: 9.6 mg/dL (ref 8.4–10.5)
Chloride: 98 mEq/L (ref 96–112)
Creatinine, Ser: 0.9 mg/dL (ref 0.40–1.50)
GFR: 86.81 mL/min (ref >60.00)
Glucose, Bld: 160 mg/dL — ABNORMAL HIGH (ref 70–99)
Potassium: 5.3 mEq/L — ABNORMAL HIGH (ref 3.5–5.1)
Sodium: 134 mEq/L — ABNORMAL LOW (ref 135–145)
Total Bilirubin: 0.7 mg/dL (ref 0.2–1.2)
Total Protein: 6.9 g/dL (ref 6.0–8.3)

## 2021-04-06 LAB — HEMOGLOBIN A1C: Hgb A1c MFr Bld: 7.4 % — ABNORMAL HIGH (ref 4.6–6.5)

## 2021-04-06 NOTE — Progress Notes (Signed)
Bmp

## 2021-04-09 ENCOUNTER — Other Ambulatory Visit (INDEPENDENT_AMBULATORY_CARE_PROVIDER_SITE_OTHER): Payer: Medicare Other

## 2021-04-09 ENCOUNTER — Other Ambulatory Visit: Payer: Self-pay

## 2021-04-09 DIAGNOSIS — E875 Hyperkalemia: Secondary | ICD-10-CM | POA: Diagnosis not present

## 2021-04-09 LAB — BASIC METABOLIC PANEL
BUN: 18 mg/dL (ref 6–23)
CO2: 27 mEq/L (ref 19–32)
Calcium: 9.4 mg/dL (ref 8.4–10.5)
Chloride: 100 mEq/L (ref 96–112)
Creatinine, Ser: 0.82 mg/dL (ref 0.40–1.50)
GFR: 89.28 mL/min (ref >60.00)
Glucose, Bld: 137 mg/dL — ABNORMAL HIGH (ref 70–99)
Potassium: 5.1 mEq/L (ref 3.5–5.1)
Sodium: 135 mEq/L (ref 135–145)

## 2021-04-14 ENCOUNTER — Other Ambulatory Visit: Payer: Self-pay | Admitting: Cardiology

## 2021-04-22 ENCOUNTER — Other Ambulatory Visit: Payer: Self-pay | Admitting: Gastroenterology

## 2021-04-22 ENCOUNTER — Other Ambulatory Visit: Payer: Self-pay | Admitting: Cardiology

## 2021-05-07 ENCOUNTER — Other Ambulatory Visit: Payer: Self-pay

## 2021-05-07 ENCOUNTER — Ambulatory Visit (INDEPENDENT_AMBULATORY_CARE_PROVIDER_SITE_OTHER): Payer: Medicare Other | Admitting: Family Medicine

## 2021-05-07 ENCOUNTER — Encounter: Payer: Self-pay | Admitting: Family Medicine

## 2021-05-07 VITALS — BP 118/68 | HR 74 | Temp 98.1°F | Ht 71.0 in | Wt 165.0 lb

## 2021-05-07 DIAGNOSIS — J4530 Mild persistent asthma, uncomplicated: Secondary | ICD-10-CM

## 2021-05-07 MED ORDER — MONTELUKAST SODIUM 10 MG PO TABS: 10.0000 mg | ORAL_TABLET | Freq: Every day | ORAL | 3 refills | Status: DC

## 2021-05-07 NOTE — Patient Instructions (Signed)
Stay on the [place] for now.  We are adding a new medication.  Use the albuterol/rescue inhaler if you are having coughing, congestion, wheezing or shotness of breath. Use it before physical activity, exercise or chores.   Let us know if you need anything.

## 2021-05-07 NOTE — Progress Notes (Signed)
Chief Complaint  Patient presents with   Follow-up   Cough    Alfred Clay is a 70 y.o. male here for asthma.  Currently treated with Flovent bid. Compliance is excellent. Uses rescue inhaler 0 times per week. Reports breathing is good overall. Still coughing, chest congestion, wheezing. No nighttime awakenings.  Past Medical History:  Diagnosis Date   Acquired esophageal ring    Asthma    uses inhaler if needed   Cancer ([place])    skin- upper back and lower back   Cataract    bilateral-removed   DM type 2 (diabetes mellitus, type 2) (HCC)    GERD (gastroesophageal reflux disease)    on meds   History of esophagitis    Hyperlipidemia    on meds   Hypertension    on meds    BP 118/68   Pulse 74   Temp 98.1 F (36.7 C) (Oral)   Ht '5\' 11"'$  (1.803 m)   Wt 165 lb (74.8 kg)   SpO2 96%   BMI 23.01 kg/m  Gen: Awake, alert HEENT: MMM, nares patent, no polyps Heart: RRR, no LE edema Lungs: +TUA noises but otherwise clear, no accessory muscle use Msk: No clubbing or cyanosis Psych: Age appropriate judgment and insight  Mild persistent asthma, unspecified whether complicated - Plan: montelukast (SINGULAIR) 10 MG tablet  Chronic, uncontrolled. Cont Flonvent bid. Add Singulair 10 mg/d. Use SABA to see if this even helps.  F/u in 1 mo. The patient voiced understanding and agreement to the plan.  [place], DO 05/07/21 9:49 AM

## 2021-06-11 ENCOUNTER — Other Ambulatory Visit: Payer: Self-pay

## 2021-06-11 ENCOUNTER — Ambulatory Visit (INDEPENDENT_AMBULATORY_CARE_PROVIDER_SITE_OTHER): Payer: Medicare Other | Admitting: Family Medicine

## 2021-06-11 ENCOUNTER — Encounter: Payer: Self-pay | Admitting: Family Medicine

## 2021-06-11 VITALS — BP 118/76 | HR 78 | Temp 98.1°F | Ht 71.0 in | Wt 163.5 lb

## 2021-06-11 DIAGNOSIS — J4540 Moderate persistent asthma, uncomplicated: Secondary | ICD-10-CM | POA: Diagnosis not present

## 2021-06-11 MED ORDER — LEVOCETIRIZINE DIHYDROCHLORIDE 5 MG PO TABS: 5.0000 mg | ORAL_TABLET | Freq: Every evening | ORAL | 2 refills | Status: DC

## 2021-06-11 MED ORDER — FLUTICASONE-SALMETEROL 100-50 MCG/ACT IN AEPB: 1.0000 | INHALATION_SPRAY | Freq: Two times a day (BID) | RESPIRATORY_TRACT | 3 refills | Status: DC

## 2021-06-11 NOTE — Progress Notes (Signed)
Chief Complaint  Patient presents with   Follow-up    1 month    Subjective: Patient is a 70 y.o. male here for f/u asthma.  Started on Singulair. Did not help cough. Compliant with the Flovent bid. No AE's. Has not used inhaler. No SOB. Coughing, some congestion in chest. No hx of smoking. +2nd hand exposure through [employer], parents, sibling.   Past Medical History:  Diagnosis Date   Acquired esophageal ring    Asthma    uses inhaler if needed   Cancer ([place])    skin- upper back and lower back   Cataract    bilateral-removed   DM type 2 (diabetes mellitus, type 2) (HCC)    GERD (gastroesophageal reflux disease)    on meds   History of esophagitis    Hyperlipidemia    on meds   Hypertension    on meds    Objective: BP 118/76   Pulse 78   Temp 98.1 F (36.7 C) (Oral)   Ht '5\' 11"'$  (1.803 m)   Wt 163 lb 8 oz (74.2 kg)   SpO2 97%   BMI 22.80 kg/m  General: Awake, appears stated age HEENT: +clear rhinorrhea b/l, +post nasal drainage noted, MMM, no pharyngeal exudate Heart: RRR, no LE edema Lungs: +diffuse exp wheezing. No accessory muscle use Psych: Age appropriate judgment and insight, normal affect and mood  Assessment and Plan: Moderate persistent asthma without complication - Plan: fluticasone-salmeterol (ADVAIR) 100-50 MCG/ACT AEPB, levocetirizine (XYZAL) 5 MG tablet  Chronic, uncontrolled. Cont Singulair 10 mg/d. Add Xyzal for drainage. Change Flovent to Advair. Cont to rinse mouth out afterwards.  F/u in 1 mo. If still uncontrolled, will add nasal spray, increase Advair dosage and refer to allergy/asthma team.  The patient voiced understanding and agreement to the plan.  [place], DO 06/11/21  9:56 AM

## 2021-06-11 NOTE — Patient Instructions (Signed)
We are done with the Flovent. Still rinse mouth out after using the Advair.   Continue the Singulair.   Use the albuterol if you feel congested, short of breath, or that you are coughing.   Let us know if you need anything.

## 2021-07-12 ENCOUNTER — Other Ambulatory Visit: Payer: Self-pay

## 2021-07-12 ENCOUNTER — Ambulatory Visit (INDEPENDENT_AMBULATORY_CARE_PROVIDER_SITE_OTHER): Payer: Medicare Other | Admitting: Gastroenterology

## 2021-07-12 ENCOUNTER — Encounter: Payer: Self-pay | Admitting: Gastroenterology

## 2021-07-12 ENCOUNTER — Telehealth: Payer: Self-pay | Admitting: General Surgery

## 2021-07-12 VITALS — BP 128/72 | HR 67 | Ht 71.0 in | Wt 165.0 lb

## 2021-07-12 DIAGNOSIS — K219 Gastro-esophageal reflux disease without esophagitis: Secondary | ICD-10-CM

## 2021-07-12 DIAGNOSIS — Z7902 Long term (current) use of antithrombotics/antiplatelets: Secondary | ICD-10-CM

## 2021-07-12 DIAGNOSIS — K449 Diaphragmatic hernia without obstruction or gangrene: Secondary | ICD-10-CM | POA: Diagnosis not present

## 2021-07-12 DIAGNOSIS — R1319 Other dysphagia: Secondary | ICD-10-CM | POA: Diagnosis not present

## 2021-07-12 DIAGNOSIS — R053 Chronic cough: Secondary | ICD-10-CM | POA: Diagnosis not present

## 2021-07-12 DIAGNOSIS — K222 Esophageal obstruction: Secondary | ICD-10-CM

## 2021-07-12 NOTE — Addendum Note (Signed)
Addended by: Lanny Hurst A on: 07/12/2021 03:36 PM   Modules accepted: Orders, SmartSet

## 2021-07-12 NOTE — H&P (View-Only) (Signed)
Chief Complaint:    Procedure follow-up, dysphagia  GI History: 70 year old male with history of CAD with STEMI in 10/2019 requiring PCI DES to LAD, asthma, diabetes, HTN, hyperlipidemia.  He follows in the GI clinic for dysphagia 2/2 esophageal stenosis requiring serial esophageal dilations as outlined below.  Biopsies at one-point in 09/2018 were concerning for lymphocytic esophagitis, but not seen on repeat biopsies.  Endoscopic history: - EGD (06/2018, Dr. Alessandra Bevels): LA Grade C esophagitis (biopsy: Ulcerated mucosa with reflux changes), non-H. pylori gastritis.  Normal duodenum with normal biopsies. - 06/2017: Started PPI bid -EGD (09/2018, Dr. Alessandra Bevels): LA Grade C esophagitis, a few benign-appearing intrinsic moderate stenoses (biopsy: Increased lymphocytes concerning for lymphocytic esophagitis) -09/2018: Continued bid PPI, added Carafate with resolution of reflux symptoms - EGD (05/2019, Dr. Silverio Decamp): LA Grade C esophagitis 20-22 cm from incisors, severe stenosis at 22 cm 8 mm diameter, not traversable.  Recommended repeat at [facility] with pediatric endoscope -EGD (04/2019, Dr. Bryan Lemma): 2 stenoses located in proximal esophagus (18 cm from incisors) and GEJ (37 cm from incisors).  Dilated with 13 mm TTS balloon with mucosal rent.  Moderately severe esophagitis mucosal friability throughout esophagus.  Biopsies suggestive of lichenoid esophagitis.  No evidence of EOE or lymphocytic esophagitis.  3 cm HH.  Normal stomach and duodenum.  -EGD (07/2019, Dr. Bryan Lemma): LA Grade C esophagitis, 2-3 cm HH, pan esophageal narrowing with severe stenosis at 18 and 40 cm from incisors, dilated with 8,9,11 mm Savary dilator with appropriate mucosal rent. Biopsies negative for EoE, LyE -EGD (08/2019, Dr. Bryan Lemma): Narrowed esophagus with dominant stricture at 64 and 40 cm, dilated with 13 mm Savary. Esophagitis 24-40 cm, 2 cm HH. Bxs negative for LyE, EoE -EGD (09/2019, Dr. Bryan Lemma): Narrowed  esophagus with dominant stricture x2 at 69 and 40 cm dilated with 14 mm Savary. 2 cm HH. - EGD (11/2020, Dr. Bryan Lemma): Stenosis at 18 cm measuring 6 mm in diameter dilated with TTS balloon and injected with Kenalog. Stenosis at 40 cm measuring 9 mm in diameter dilated with 12 mm TTS balloon and injected with Kenalog. - EGD (12/2020, Dr. Harper Smoker):Stenosis at 18 cm dilated with 12 mm TTS balloon and injected with Kenalog. Stenosis at 40 cm dilated with 16.5 mm TTS balloon and dilation of mid esophageal stricture at 30 cm with 16.5 mm balloon. Esophagitis. 2 cm HH - EGD (01/2021, Dr. Bryan Lemma): Stenosis at 18, 30, and 40 cm, all dilated with 15 mm TTS balloon, decreased esophageal motility.  2 cm HH     -Previously followed in [place] with EGD approximately 03/2018 (GERD, dysphagia, odynophagia) with Candida Esophagitis may have shown fungal infection and H. pylori per patient.  No reports available for review. -Colonoscopy (approximately 02/2018, [place]): Normal per patient  HPI:     Patient is a 70 y.o. male presenting to the Gastroenterology Clinic for follow-up.  Last seen in the office by me on 10/22/2020 for continued esophageal dysphagia.  Has had continued serial EGDs with dilation along with Kenalog injection in 12/2020 as outlined above.  Recommended repeat upper endoscopy after another 3 months.  Today, he states his dysphagia improved with some above interventions in April and May, but has been worsening again over the last 3 weeks or so.  No reflux symptoms or nausea/vomiting.  Still taking Protonix 40 mg bid. Worse with meat, bread.   Has f/u with Dr. Marlou Porch on 11/4. Just ASA 81 mg now.   Does have increasing chronic, dry cough.  Not responsive to  multiple trials of inhalers by PCM.  Otherwise no heartburn, regurgitation.  Review of systems:     No chest pain, no SOB, no fevers, no urinary sx   Past Medical History:  Diagnosis Date   Acquired esophageal ring    Asthma     uses inhaler if needed   Cancer ([place])    skin- upper back and lower back   Cataract    bilateral-removed   DM type 2 (diabetes mellitus, type 2) (HCC)    GERD (gastroesophageal reflux disease)    on meds   History of esophagitis    Hyperlipidemia    on meds   Hypertension    on meds    Patient's surgical history, family medical history, social history, medications and allergies were all reviewed in Epic    Current Outpatient Medications  Medication Sig Dispense Refill   albuterol (VENTOLIN HFA) 108 (90 Base) MCG/ACT inhaler Inhale 2 puffs into the lungs every 6 (six) hours as needed for wheezing or shortness of breath. 18 g 1   aspirin EC 81 MG tablet Take 1 tablet (81 mg total) by mouth daily. (Patient taking differently: Take 81 mg by mouth in the morning.) 90 tablet 3   atorvastatin (LIPITOR) 80 MG tablet Take 1 tablet (80 mg total) by mouth in the morning. 90 tablet 1   Cobalamin Combinations (NEURIVA PLUS) CAPS Take 1 capsule by mouth daily.     fluticasone-salmeterol (ADVAIR) 100-50 MCG/ACT AEPB Inhale 1 puff into the lungs 2 (two) times daily. 1 each 3   levocetirizine (XYZAL) 5 MG tablet Take 1 tablet (5 mg total) by mouth every evening. 30 tablet 2   metFORMIN (GLUCOPHAGE) 500 MG tablet TAKE 1 TABLET (500 MG TOTAL) BY MOUTH 2 (TWO) TIMES DAILY WITH A MEAL. 180 tablet 2   metoprolol succinate (TOPROL-XL) 25 MG 24 hr tablet TAKE 1/2 TABLET EVERY DAY 45 tablet 3   montelukast (SINGULAIR) 10 MG tablet Take 1 tablet (10 mg total) by mouth at bedtime. 30 tablet 3   nitroGLYCERIN (NITROSTAT) 0.4 MG SL tablet Place 1 tablet (0.4 mg total) under the tongue every 5 (five) minutes as needed. (Patient taking differently: Place 0.4 mg under the tongue every 5 (five) minutes x 3 doses as needed for chest pain.) 25 tablet 12   pantoprazole (PROTONIX) 40 MG tablet TAKE 1 TABLET TWICE DAILY (Patient taking differently: Take 40 mg by mouth 2 (two) times daily.) 180 tablet 0   sucralfate  (CARAFATE) 1 GM/10ML suspension TAKE 10 ML (1 GRAM TOTAL) BY MOUTH FOUR TIMES DAILY. 420 mL 1   No current facility-administered medications for this visit.    Physical Exam:     BP 128/72   Pulse 67   Ht 5\' 11"  (1.803 m)   Wt 165 lb (74.8 kg)   SpO2 98%   BMI 23.01 kg/m   GENERAL:  Pleasant male in NAD PSYCH: : Cooperative, normal affect Musculoskeletal:  Normal muscle tone, normal strength NEURO: Alert and oriented x 3, no focal neurologic deficits   IMPRESSION and PLAN:    1) Dysphagia 2) Esophageal stricture  - Repeat EGD with repeat esophageal dilation.  Based on clinical history of previously requiring ultraslim scope, will again schedule to be done at [place] do not have any availability at [facility] currently, so will try to schedule to be done with one of my partners.  I discussed this possibility with the patient and his wife, and he consents to  having one of my partners do the procedure if needed - Okay to continue ASA 81 mg in the perioperative period - Continue cutting food into small pieces, chewing food thoroughly, drinking plenty of fluids with meals - Plan for Esophageal Manometry - Referral to Dr. Adria Devon at [facility] for evaluation and possible additional endoscopic therapeutic modalities given chronic recurrent nature  3) GERD with hiatal hernia 4) Chronic cough - Increasing chronic, dry cough not responsive to inhalers.  Otherwise heartburn, regurgitation well-controlled on Protonix - Schedule pH and impedance testing, to be done on PPI to evaluate for breakthrough refluxate -Continue Protonix as prescribed  5) CAD with history of STEMI 6) Chronic aspirin - Obtain Cardiology clearance for repeat upper endoscopy - Has follow-up with Dr. Marlou Porch on 07/30/2021 - No longer taking Brilinta.  Okay to continue ASA 81 mg in perioperative period  The indications, risks, and benefits of EGD were explained to the patient in detail. Risks include  but are not limited to bleeding, perforation, adverse reaction to medications, and cardiopulmonary compromise. Sequelae include but are not limited to the possibility of surgery, hospitalization, and mortality. The patient verbalized understanding and wished to proceed. All questions answered, referred to scheduler. Further recommendations pending results of the exam.        Alfred Clay ,DO, FACG 07/12/2021, 9:09 AM

## 2021-07-12 NOTE — Patient Instructions (Addendum)
If you are age 70 or older, your body mass index should be between 23-30. Your Body mass index is 23.01 kg/m. If this is out of the aforementioned range listed, please consider follow up with your Primary Care Provider.  If you are age 71 or younger, your body mass index should be between 19-25. Your Body mass index is 23.01 kg/m. If this is out of the aformentioned range listed, please consider follow up with your Primary Care Provider.   __________________________________________________________  The [facility] providers would like to encourage you to use [facility] to communicate with providers for non-urgent requests or questions.  Due to long hold times on the telephone, sending your provider a message by [facility] may be a faster and more efficient way to get a response.  Please allow 48 business hours for a response.  Please remember that this is for non-urgent requests._________________________________________________________   Alfred Clay have been scheduled for an Esophageal Manometry and 24 Hour pH study at [facility] on 10/20/2021 at 8:30 am. Please arrive 30 minutes prior to your procedure for registration. You will need to go to outpatient registration (1st floor of the hospital) first. Make certain to bring your insurance cards as well as a complete list of medications.  Please remember the following:  1) Do not take any muscle relaxants, xanax (alprazolam) or ativan for 1 day prior to your test as well as the day of the test.  2) Nothing to eat or drink after 12:00 midnight on the night before your test.  3) Hold all diabetic medications/insulin the morning of the test. You may eat and take your medications after the test.  4) For 7 days prior to your test, do not take: Reglan, Tagamet, Zantac, Phenergan, Axid or Pepcid.  5) You MAY use an antacid such as Rolaids or Tums up to 12 hours prior to your test.  6) Continue taking pantoprazole and  carafate  ------------------------------------------------------------------------------------------- ABOUT ESOPHAGEAL MANOMETRY Esophageal manometry (muh-NOM-uh-tree) is a test that gauges how well your esophagus works. Your esophagus is the long, muscular tube that connects your throat to your stomach. Esophageal manometry measures the rhythmic muscle contractions (peristalsis) that occur in your esophagus when you swallow. Esophageal manometry also measures the coordination and force exerted by the muscles of your esophagus.  During esophageal manometry, a thin, flexible tube (catheter) that contains sensors is passed through your nose, down your esophagus and into your stomach. Esophageal manometry can be helpful in diagnosing some mostly uncommon disorders that affect your esophagus.  Why it's done Esophageal manometry is used to evaluate the movement (motility) of food through the esophagus and into the stomach. The test measures how well the circular bands of muscle (sphincters) at the top and bottom of your esophagus open and close, as well as the pressure, strength and pattern of the wave of esophageal muscle contractions that moves food along.  What you can expect Esophageal manometry is an outpatient procedure done without sedation. Most people tolerate it well. You may be asked to change into a hospital gown before the test starts.  During esophageal manometry  While you are sitting up, a member of your health care team sprays your throat with a numbing medication or puts numbing gel in your nose or both.  A catheter is guided through your nose into your esophagus. The catheter may be sheathed in a water-filled sleeve. It doesn't interfere with your breathing. However, your eyes may water, and you may gag. You may  have a slight nosebleed from irritation.  After the catheter is in place, you may be asked to lie on your back on an exam table, or you may be asked to remain seated.  You then  swallow small sips of water. As you do, a computer connected to the catheter records the pressure, strength and pattern of your esophageal muscle contractions.  During the test, you'll be asked to breathe slowly and smoothly, remain as still as possible, and swallow only when you're asked to do so.  A member of your health care team may move the catheter down into your stomach while the catheter continues its measurements.  The catheter then is slowly withdrawn.  This test typically takes 30-45 minutes to complete.  ---------------------------------------------------------------------------------------------- ABOUT 24 HOUR PH PROBE An esophageal pH test measures and records the pH in your esophagus to determine if you have gastroesophageal reflux disease (GERD). The test can also be done to determine the effectiveness of medications or surgical treatment for GERD. What is esophageal reflux? Esophageal reflux is a condition in which stomach acid refluxes or moves back into the esophagus (the "food pipe" leading from the mouth to the stomach). How does the esophageal pH test work? A thin, small tube with an acid sensing device on the tip is gently passed through your nose, down the esophagus ("food tube"), and positioned about 2 inches above the lower esophageal sphincter. The tube is secured to the side of your face with clear tape. The end of the tube exiting from your nose is attached to a portable recorder that is worn on your belt or over your shoulder. The recorder has several buttons on it that you will press to mark certain events. A nurse will review the monitoring instructions with you. Once the test has begun, what do I need to know and do? Activity: Follow your usual daily routine. Do not reduce or change your activities during the monitoring period. Doing so can make the monitoring results less useful.  Note: do not take a tub bath or shower; the equipment can't get wet.  Eating: Eat your  regular meals at the usual times. If you do not eat during the monitoring period, your stomach will not produce acid as usual, and the test results will not be accurate. Eat at least 2 meals a day. Eat foods that tend to increase your symptoms (without making yourself miserable). Avoid snacking. Do not suck on hard candy or lozenges and do not chew gum during the monitoring period.  Lying down: Remain upright throughout the day. Do not lie down until you go to bed (unless napping or lying down during the day is part of your daily routine).  Medications: Continue to follow your doctor's advice regarding medications to avoid during the monitoring period.  Recording symptoms: Press the appropriate button on your recorder when symptoms occur (as discussed with the nurse).  Recording events: Record the time you start and stop eating and drinking (anything other than plain water). Record the time you lie down (even if just resting) and when you get back up. The nurse will explain this.  Unusual symptoms or side effects. If you think you may be experiencing any unusual symptoms or side effects, call your doctor.  You will return the next day to have the tube removed. The information on the recorder will be downloaded to a computer and the results will be analyzed.  After completion of the study Resume your normal diet and medications. Lozenges  or hard candy may help ease any sore throat caused by the tube.   It will take at least 2 weeks to receive the results of this test from your physician.    We will call you regarding your endoscopy at [facility].   Due to recent changes in healthcare laws, you may see the results of your imaging and laboratory studies on MyChart before your provider has had a chance to review them.  We understand that in some cases there may be results that are confusing or concerning to you. Not all laboratory results come back in the same time frame and the provider may be waiting  for multiple results in order to interpret others.  Please give [place] 48 hours in order for your provider to thoroughly review all the results before contacting the office for clarification of your results.   You will hear from Dr Gwenyth Bouillon office regarding an appointment.   Thank you for choosing me and [facility].  Vito Cirigliano, D.O.

## 2021-07-12 NOTE — Addendum Note (Signed)
Addended by: Lanny Hurst A on: 07/12/2021 03:39 PM   Modules accepted: Orders

## 2021-07-12 NOTE — Telephone Encounter (Signed)
Per Dr Laurie Panda Fuller Plan patient will have an EGD with Dilation at [facility] on 07/26/2021 at 76 with Dr Fuller Plan. Notified the patients wife and they verbalized understanding.  Told them I would send all instructions via mychart and as soon as I hear back from the cardiologist I will contact him.

## 2021-07-12 NOTE — Progress Notes (Signed)
Chief Complaint:    Procedure follow-up, dysphagia  GI History: 70 year old male with history of CAD with STEMI in 10/2019 requiring PCI DES to LAD, asthma, diabetes, HTN, hyperlipidemia.  He follows in the GI clinic for dysphagia 2/2 esophageal stenosis requiring serial esophageal dilations as outlined below.  Biopsies at one-point in 09/2018 were concerning for lymphocytic esophagitis, but not seen on repeat biopsies.  Endoscopic history: - EGD (06/2018, Dr. Alessandra Bevels): LA Grade C esophagitis (biopsy: Ulcerated mucosa with reflux changes), non-H. pylori gastritis.  Normal duodenum with normal biopsies. - 06/2017: Started PPI bid -EGD (09/2018, Dr. Alessandra Bevels): LA Grade C esophagitis, a few benign-appearing intrinsic moderate stenoses (biopsy: Increased lymphocytes concerning for lymphocytic esophagitis) -09/2018: Continued bid PPI, added Carafate with resolution of reflux symptoms - EGD (05/2019, Dr. Silverio Decamp): LA Grade C esophagitis 20-22 cm from incisors, severe stenosis at 22 cm 8 mm diameter, not traversable.  Recommended repeat at [facility] with pediatric endoscope -EGD (04/2019, Dr. Bryan Lemma): 2 stenoses located in proximal esophagus (18 cm from incisors) and GEJ (37 cm from incisors).  Dilated with 13 mm TTS balloon with mucosal rent.  Moderately severe esophagitis mucosal friability throughout esophagus.  Biopsies suggestive of lichenoid esophagitis.  No evidence of EOE or lymphocytic esophagitis.  3 cm HH.  Normal stomach and duodenum.  -EGD (07/2019, Dr. Bryan Lemma): LA Grade C esophagitis, 2-3 cm HH, pan esophageal narrowing with severe stenosis at 18 and 40 cm from incisors, dilated with 8,9,11 mm Savary dilator with appropriate mucosal rent. Biopsies negative for EoE, LyE -EGD (08/2019, Dr. Bryan Lemma): Narrowed esophagus with dominant stricture at 40 and 40 cm, dilated with 13 mm Savary. Esophagitis 24-40 cm, 2 cm HH. Bxs negative for LyE, EoE -EGD (09/2019, Dr. Bryan Lemma): Narrowed  esophagus with dominant stricture x2 at 67 and 40 cm dilated with 14 mm Savary. 2 cm HH. - EGD (11/2020, Dr. Bryan Lemma): Stenosis at 18 cm measuring 6 mm in diameter dilated with TTS balloon and injected with Kenalog. Stenosis at 40 cm measuring 9 mm in diameter dilated with 12 mm TTS balloon and injected with Kenalog. - EGD (12/2020, Dr. Dachelle Molzahn):Stenosis at 18 cm dilated with 12 mm TTS balloon and injected with Kenalog. Stenosis at 40 cm dilated with 16.5 mm TTS balloon and dilation of mid esophageal stricture at 30 cm with 16.5 mm balloon. Esophagitis. 2 cm HH - EGD (01/2021, Dr. Bryan Lemma): Stenosis at 18, 30, and 40 cm, all dilated with 15 mm TTS balloon, decreased esophageal motility.  2 cm HH     -Previously followed in [place] with EGD approximately 03/2018 (GERD, dysphagia, odynophagia) with Candida Esophagitis may have shown fungal infection and H. pylori per patient.  No reports available for review. -Colonoscopy (approximately 02/2018, [place]): Normal per patient  HPI:     Patient is a 70 y.o. male presenting to the Gastroenterology Clinic for follow-up.  Last seen in the office by me on 10/22/2020 for continued esophageal dysphagia.  Has had continued serial EGDs with dilation along with Kenalog injection in 12/2020 as outlined above.  Recommended repeat upper endoscopy after another 3 months.  Today, he states his dysphagia improved with some above interventions in April and May, but has been worsening again over the last 3 weeks or so.  No reflux symptoms or nausea/vomiting.  Still taking Protonix 40 mg bid. Worse with meat, bread.   Has f/u with Dr. Marlou Porch on 11/4. Just ASA 81 mg now.   Does have increasing chronic, dry cough.  Not responsive to  multiple trials of inhalers by PCM.  Otherwise no heartburn, regurgitation.  Review of systems:     No chest pain, no SOB, no fevers, no urinary sx   Past Medical History:  Diagnosis Date   Acquired esophageal ring    Asthma     uses inhaler if needed   Cancer ([place])    skin- upper back and lower back   Cataract    bilateral-removed   DM type 2 (diabetes mellitus, type 2) (HCC)    GERD (gastroesophageal reflux disease)    on meds   History of esophagitis    Hyperlipidemia    on meds   Hypertension    on meds    Patient's surgical history, family medical history, social history, medications and allergies were all reviewed in Epic    Current Outpatient Medications  Medication Sig Dispense Refill   albuterol (VENTOLIN HFA) 108 (90 Base) MCG/ACT inhaler Inhale 2 puffs into the lungs every 6 (six) hours as needed for wheezing or shortness of breath. 18 g 1   aspirin EC 81 MG tablet Take 1 tablet (81 mg total) by mouth daily. (Patient taking differently: Take 81 mg by mouth in the morning.) 90 tablet 3   atorvastatin (LIPITOR) 80 MG tablet Take 1 tablet (80 mg total) by mouth in the morning. 90 tablet 1   Cobalamin Combinations (NEURIVA PLUS) CAPS Take 1 capsule by mouth daily.     fluticasone-salmeterol (ADVAIR) 100-50 MCG/ACT AEPB Inhale 1 puff into the lungs 2 (two) times daily. 1 each 3   levocetirizine (XYZAL) 5 MG tablet Take 1 tablet (5 mg total) by mouth every evening. 30 tablet 2   metFORMIN (GLUCOPHAGE) 500 MG tablet TAKE 1 TABLET (500 MG TOTAL) BY MOUTH 2 (TWO) TIMES DAILY WITH A MEAL. 180 tablet 2   metoprolol succinate (TOPROL-XL) 25 MG 24 hr tablet TAKE 1/2 TABLET EVERY DAY 45 tablet 3   montelukast (SINGULAIR) 10 MG tablet Take 1 tablet (10 mg total) by mouth at bedtime. 30 tablet 3   nitroGLYCERIN (NITROSTAT) 0.4 MG SL tablet Place 1 tablet (0.4 mg total) under the tongue every 5 (five) minutes as needed. (Patient taking differently: Place 0.4 mg under the tongue every 5 (five) minutes x 3 doses as needed for chest pain.) 25 tablet 12   pantoprazole (PROTONIX) 40 MG tablet TAKE 1 TABLET TWICE DAILY (Patient taking differently: Take 40 mg by mouth 2 (two) times daily.) 180 tablet 0   sucralfate  (CARAFATE) 1 GM/10ML suspension TAKE 10 ML (1 GRAM TOTAL) BY MOUTH FOUR TIMES DAILY. 420 mL 1   No current facility-administered medications for this visit.    Physical Exam:     BP 128/72   Pulse 67   Ht 5\' 11"  (1.803 m)   Wt 165 lb (74.8 kg)   SpO2 98%   BMI 23.01 kg/m   GENERAL:  Pleasant male in NAD PSYCH: : Cooperative, normal affect Musculoskeletal:  Normal muscle tone, normal strength NEURO: Alert and oriented x 3, no focal neurologic deficits   IMPRESSION and PLAN:    1) Dysphagia 2) Esophageal stricture  - Repeat EGD with repeat esophageal dilation.  Based on clinical history of previously requiring ultraslim scope, will again schedule to be done at [facility] do not have any availability at [facility] currently, so will try to schedule to be done with one of my partners.  I discussed this possibility with the patient and his wife, and he consents to  having one of my partners do the procedure if needed - Okay to continue ASA 81 mg in the perioperative period - Continue cutting food into small pieces, chewing food thoroughly, drinking plenty of fluids with meals - Plan for Esophageal Manometry - Referral to Dr. Adria Devon at [facility] for evaluation and possible additional endoscopic therapeutic modalities given chronic recurrent nature  3) GERD with hiatal hernia 4) Chronic cough - Increasing chronic, dry cough not responsive to inhalers.  Otherwise heartburn, regurgitation well-controlled on Protonix - Schedule pH and impedance testing, to be done on PPI to evaluate for breakthrough refluxate -Continue Protonix as prescribed  5) CAD with history of STEMI 6) Chronic aspirin - Obtain Cardiology clearance for repeat upper endoscopy - Has follow-up with Dr. Marlou Porch on 07/30/2021 - No longer taking Brilinta.  Okay to continue ASA 81 mg in perioperative period  The indications, risks, and benefits of EGD were explained to the patient in detail. Risks include  but are not limited to bleeding, perforation, adverse reaction to medications, and cardiopulmonary compromise. Sequelae include but are not limited to the possibility of surgery, hospitalization, and mortality. The patient verbalized understanding and wished to proceed. All questions answered, referred to scheduler. Further recommendations pending results of the exam.        [name] Mikiya Nebergall ,DO, FACG 07/12/2021, 9:09 AM

## 2021-07-13 ENCOUNTER — Encounter: Payer: Self-pay | Admitting: Family Medicine

## 2021-07-13 ENCOUNTER — Other Ambulatory Visit: Payer: Self-pay

## 2021-07-13 ENCOUNTER — Ambulatory Visit (INDEPENDENT_AMBULATORY_CARE_PROVIDER_SITE_OTHER): Payer: Medicare Other | Admitting: Family Medicine

## 2021-07-13 VITALS — BP 122/84 | HR 67 | Temp 98.0°F | Ht 71.0 in | Wt 165.0 lb

## 2021-07-13 DIAGNOSIS — J309 Allergic rhinitis, unspecified: Secondary | ICD-10-CM

## 2021-07-13 DIAGNOSIS — J4530 Mild persistent asthma, uncomplicated: Secondary | ICD-10-CM | POA: Diagnosis not present

## 2021-07-13 DIAGNOSIS — Z23 Encounter for immunization: Secondary | ICD-10-CM

## 2021-07-13 NOTE — Patient Instructions (Signed)
If we are still having issues in ~6 weeks, send me a message and I will add a nasal spray to take with the rest.   We are done with the Advair and the like.  Let us know if you need anything.

## 2021-07-13 NOTE — Progress Notes (Signed)
Chief Complaint  Patient presents with   Follow-up    Alfred Clay is a 70 y.o. male here for asthma/allergy f/u.  Currently treated with Singulair 10 mg/d, Advair 100-50 mcg bid, Xyzal 5 mg/d. Also started Flonase.  Compliance is excellent. Doing better since starting Flonase. Reports feeling sluggish/lethargic on Advair and stopped. Noticed no changes in breathing when doing this. Reports breathing is good overall unless he has drainage from nose. No nighttime awakenings.  Past Medical History:  Diagnosis Date   Acquired esophageal ring    Asthma    uses inhaler if needed   Cancer ([place])    skin- upper back and lower back   Cataract    bilateral-removed   DM type 2 (diabetes mellitus, type 2) (HCC)    GERD (gastroesophageal reflux disease)    on meds   History of esophagitis    Hyperlipidemia    on meds   Hypertension    on meds    BP 122/84   Pulse 67   Temp 98 F (36.7 C) (Oral)   Ht 5\' 11"  (1.803 m)   Wt 165 lb (74.8 kg)   SpO2 95%   BMI 23.01 kg/m  Gen: Awake, alert HEENT: MMM, nares patent, no polyps Heart: RRR, no LE edema Lungs: CTAB, no accessory muscle use Msk: No clubbing or cyanosis Psych: Age appropriate judgment and insight  Mild persistent asthma without complication  Allergic rhinitis, unspecified seasonality, unspecified trigger  Need for influenza vaccination - Plan: Flu Vaccine QUAD High Dose(Fluad)  Cont Xyzal 5 mg/d, Flonase, Singulair 10 mg/d. Stop Advair.  F/u in 6 mo or prn. The patient voiced understanding and agreement to the plan.  [place], DO 07/13/21 11:13 AM

## 2021-07-15 ENCOUNTER — Encounter (HOSPITAL_COMMUNITY): Payer: Self-pay | Admitting: Gastroenterology

## 2021-07-16 NOTE — Telephone Encounter (Signed)
Erroneous encounter

## 2021-07-25 NOTE — Anesthesia Preprocedure Evaluation (Addendum)
Anesthesia Evaluation  Patient identified by MRN, date of birth, ID band Patient awake    Reviewed: Allergy & Precautions, NPO status , Patient's Chart, lab work & pertinent test results  History of Anesthesia Complications Negative for: history of anesthetic complications  Airway Mallampati: II  TM Distance: >3 FB Neck ROM: Full    Dental no notable dental hx. (+) Dental Advisory Given   Pulmonary asthma ,    Pulmonary exam normal        Cardiovascular hypertension, Pt. on home beta blockers + CAD, + Past MI and + Cardiac Stents  Normal cardiovascular exam     Neuro/Psych negative neurological ROS  negative psych ROS   GI/Hepatic Neg liver ROS, GERD  Medicated,  Endo/Other  diabetes, Type 2  Renal/GU negative Renal ROS  negative genitourinary   Musculoskeletal negative musculoskeletal ROS (+)   Abdominal   Peds negative pediatric ROS (+)  Hematology negative hematology ROS (+)   Anesthesia Other Findings   Reproductive/Obstetrics negative OB ROS                            Anesthesia Physical  Anesthesia Plan  ASA: 3  Anesthesia Plan: MAC   Post-op Pain Management:    Induction: Intravenous  PONV Risk Score and Plan: 1 and Propofol infusion and Treatment may vary due to age or medical condition  Airway Management Planned: Simple Face Mask  Additional Equipment:   Intra-op Plan:   Post-operative Plan:   Informed Consent: I have reviewed the patients History and Physical, chart, labs and discussed the procedure including the risks, benefits and alternatives for the proposed anesthesia with the patient or authorized representative who has indicated his/her understanding and acceptance.     Dental advisory given  Plan Discussed with: CRNA and Anesthesiologist  Anesthesia Plan Comments:        Anesthesia Quick Evaluation

## 2021-07-26 ENCOUNTER — Encounter (HOSPITAL_COMMUNITY)
Admission: RE | Disposition: A | Discharge status: Home / Self Care | Payer: Self-pay | Source: Home / Self Care | Attending: Gastroenterology

## 2021-07-26 ENCOUNTER — Ambulatory Visit (HOSPITAL_COMMUNITY)
Admission: RE | Admit: 2021-07-26 | Discharge: 2021-07-26 | Disposition: A | Discharge status: Home / Self Care | Payer: Medicare Other | Attending: Gastroenterology | Admitting: Gastroenterology

## 2021-07-26 ENCOUNTER — Other Ambulatory Visit: Payer: Self-pay

## 2021-07-26 ENCOUNTER — Ambulatory Visit (HOSPITAL_COMMUNITY): Payer: Medicare Other | Admitting: Anesthesiology

## 2021-07-26 ENCOUNTER — Encounter (HOSPITAL_COMMUNITY): Payer: Self-pay | Admitting: Gastroenterology

## 2021-07-26 DIAGNOSIS — J45909 Unspecified asthma, uncomplicated: Secondary | ICD-10-CM | POA: Diagnosis not present

## 2021-07-26 DIAGNOSIS — Z7951 Long term (current) use of inhaled steroids: Secondary | ICD-10-CM | POA: Insufficient documentation

## 2021-07-26 DIAGNOSIS — I2510 Atherosclerotic heart disease of native coronary artery without angina pectoris: Secondary | ICD-10-CM | POA: Insufficient documentation

## 2021-07-26 DIAGNOSIS — I252 Old myocardial infarction: Secondary | ICD-10-CM | POA: Insufficient documentation

## 2021-07-26 DIAGNOSIS — K449 Diaphragmatic hernia without obstruction or gangrene: Secondary | ICD-10-CM | POA: Insufficient documentation

## 2021-07-26 DIAGNOSIS — K219 Gastro-esophageal reflux disease without esophagitis: Secondary | ICD-10-CM | POA: Insufficient documentation

## 2021-07-26 DIAGNOSIS — R1314 Dysphagia, pharyngoesophageal phase: Secondary | ICD-10-CM | POA: Insufficient documentation

## 2021-07-26 DIAGNOSIS — E785 Hyperlipidemia, unspecified: Secondary | ICD-10-CM | POA: Insufficient documentation

## 2021-07-26 DIAGNOSIS — Z955 Presence of coronary angioplasty implant and graft: Secondary | ICD-10-CM | POA: Diagnosis not present

## 2021-07-26 DIAGNOSIS — R1319 Other dysphagia: Secondary | ICD-10-CM

## 2021-07-26 DIAGNOSIS — Z7982 Long term (current) use of aspirin: Secondary | ICD-10-CM | POA: Insufficient documentation

## 2021-07-26 DIAGNOSIS — Z7984 Long term (current) use of oral hypoglycemic drugs: Secondary | ICD-10-CM | POA: Diagnosis not present

## 2021-07-26 DIAGNOSIS — K222 Esophageal obstruction: Secondary | ICD-10-CM | POA: Insufficient documentation

## 2021-07-26 DIAGNOSIS — E119 Type 2 diabetes mellitus without complications: Secondary | ICD-10-CM | POA: Insufficient documentation

## 2021-07-26 DIAGNOSIS — R053 Chronic cough: Secondary | ICD-10-CM

## 2021-07-26 DIAGNOSIS — K2100 Gastro-esophageal reflux disease with esophagitis, without bleeding: Secondary | ICD-10-CM | POA: Diagnosis not present

## 2021-07-26 HISTORY — PX: ESOPHAGOGASTRODUODENOSCOPY (EGD) WITH PROPOFOL: SHX5813

## 2021-07-26 HISTORY — PX: BALLOON DILATION: SHX5330

## 2021-07-26 HISTORY — PX: KENALOG INJECTION: SHX5298

## 2021-07-26 LAB — GLUCOSE, CAPILLARY: Glucose-Capillary: 119 mg/dL — ABNORMAL HIGH (ref 70–99)

## 2021-07-26 SURGERY — ESOPHAGOGASTRODUODENOSCOPY (EGD) WITH PROPOFOL
Anesthesia: Monitor Anesthesia Care

## 2021-07-26 MED ORDER — LIDOCAINE HCL (CARDIAC) PF 100 MG/5ML IV SOSY
PREFILLED_SYRINGE | INTRAVENOUS | Status: DC | PRN
Start: 2021-07-26 — End: 2021-07-26
  Administered 2021-07-26: 100 mg via INTRAVENOUS

## 2021-07-26 MED ORDER — TRIAMCINOLONE ACETONIDE 40 MG/ML IJ SUSP
INTRAMUSCULAR | Status: AC
  Filled 2021-07-26: qty 1

## 2021-07-26 MED ORDER — SODIUM CHLORIDE 0.9 % IV SOLN: INTRAVENOUS | Status: DC

## 2021-07-26 MED ORDER — TRIAMCINOLONE ACETONIDE 40 MG/ML IJ SUSP
INTRAMUSCULAR | Status: DC | PRN
  Administered 2021-07-26: 40 mg

## 2021-07-26 MED ORDER — PROPOFOL 500 MG/50ML IV EMUL
INTRAVENOUS | Status: DC | PRN
  Administered 2021-07-26: 125 ug/kg/min via INTRAVENOUS

## 2021-07-26 MED ORDER — SODIUM CHLORIDE (PF) 0.9 % IJ SOLN
INTRAMUSCULAR | Status: AC
  Filled 2021-07-26: qty 10

## 2021-07-26 MED ORDER — PROPOFOL 1000 MG/100ML IV EMUL
INTRAVENOUS | Status: AC
  Filled 2021-07-26: qty 100

## 2021-07-26 MED ORDER — PROPOFOL 10 MG/ML IV BOLUS
INTRAVENOUS | Status: DC | PRN
  Administered 2021-07-26: 30 mg via INTRAVENOUS
  Administered 2021-07-26: 10 mg via INTRAVENOUS
  Administered 2021-07-26 (×2): 20 mg via INTRAVENOUS

## 2021-07-26 MED ORDER — TRIAMCINOLONE ACETONIDE 40 MG/ML IJ SUSP
INTRAMUSCULAR | Status: AC
  Filled 2021-07-26: qty 2

## 2021-07-26 MED ORDER — LACTATED RINGERS IV SOLN
INTRAVENOUS | Status: AC | PRN
  Administered 2021-07-26: 1000 mL via INTRAVENOUS

## 2021-07-26 SURGICAL SUPPLY — 14 items

## 2021-07-26 NOTE — Discharge Instructions (Signed)
YOU HAD AN ENDOSCOPIC PROCEDURE TODAY: Refer to the procedure report and other information in the discharge instructions given to you for any specific questions about what was found during the examination. If this information does not answer your questions, please call [place] office at [phone] to clarify.   YOU SHOULD EXPECT: Some feelings of bloating in the abdomen. Passage of more gas than usual. Walking can help get rid of the air that was put into your GI tract during the procedure and reduce the bloating. If you had a lower endoscopy (such as a colonoscopy or flexible sigmoidoscopy) you may notice spotting of blood in your stool or on the toilet paper. Some abdominal soreness may be present for a day or two, also.  DIET: Your first meal following the procedure should be a light meal and then it is ok to progress to your normal diet. A half-sandwich or bowl of soup is an example of a good first meal. Heavy or fried foods are harder to digest and may make you feel nauseous or bloated. Drink plenty of fluids but you should avoid alcoholic beverages for 24 hours. If you had a esophageal dilation, please see attached instructions for diet.    ACTIVITY: Your care partner should take you home directly after the procedure. You should plan to take it easy, moving slowly for the rest of the day. You can resume normal activity the day after the procedure however YOU SHOULD NOT DRIVE, use power tools, machinery or perform tasks that involve climbing or major physical exertion for 24 hours (because of the sedation medicines used during the test).   SYMPTOMS TO REPORT IMMEDIATELY: A gastroenterologist can be reached at any hour. Please call [phone]  for any of the following symptoms:   Following upper endoscopy (EGD, EUS, ERCP, esophageal dilation) Vomiting of blood or coffee ground material  New, significant abdominal pain  New, significant chest pain or pain under the shoulder blades  Painful or  persistently difficult swallowing  New shortness of breath  Black, tarry-looking or red, bloody stools  FOLLOW UP:  If any biopsies were taken you will be contacted by phone or by letter within the next 1-3 weeks. Call [phone]  if you have not heard about the biopsies in 3 weeks.  Please also call with any specific questions about appointments or follow up tests.  

## 2021-07-26 NOTE — Transfer of Care (Signed)
Immediate Anesthesia Transfer of Care Note  Patient: Alfred Clay  Procedure(s) Performed: ESOPHAGOGASTRODUODENOSCOPY (EGD) WITH PROPOFOL BALLOON DILATION KENALOG INJECTION  Patient Location: Endoscopy Unit  Anesthesia Type:MAC  Level of Consciousness: drowsy and patient cooperative  Airway & Oxygen Therapy: Patient Spontanous Breathing and Patient connected to face mask oxygen  Post-op Assessment: Report given to RN and Post -op Vital signs reviewed and stable  Post vital signs: Reviewed and stable  Last Vitals:  Vitals Value Taken Time  BP 147/77 0812  Temp    Pulse 76 07/26/21 0813  Resp 20 07/26/21 0813  SpO2 100 % 07/26/21 0813  Vitals shown include unvalidated device data.  Last Pain:  Vitals:   07/26/21 0716  TempSrc: Oral  PainSc: 0-No pain         Complications: No notable events documented.

## 2021-07-26 NOTE — Op Note (Signed)
[facility] Patient Name: Alfred Clay Procedure Date: 07/26/2021 MRN: [id number] Attending MD: Ladene Artist , MD Date of Birth: 06/03/1951 CSN: [id number] Age: 70 Admit Type: Outpatient Procedure:                Upper GI endoscopy Indications:              Dysphagia, For therapy of esophageal stricture Providers:                Pricilla Riffle. Fuller Plan, MD, Debi Mays, RN, Doristine Johns, RN, Luan Moore, Technician, Hedy Camara Referring MD:             Shelda Pal, DO Medicines:                Monitored Anesthesia Care Complications:            No immediate complications. Estimated Blood Loss:     Estimated blood loss was minimal. Procedure:                Pre-Anesthesia Assessment:                           - Prior to the procedure, a History and Physical                            was performed, and patient medications and                            allergies were reviewed. The patient's tolerance of                            previous anesthesia was also reviewed. The risks                            and benefits of the procedure and the sedation                            options and risks were discussed with the patient.                            All questions were answered, and informed consent                            was obtained. Prior Anticoagulants: The patient has                            taken no previous anticoagulant or antiplatelet                            agents. ASA Grade Assessment: II - A patient with  mild systemic disease. After reviewing the risks                            and benefits, the patient was deemed in                            satisfactory condition to undergo the procedure.                           After obtaining informed consent, the endoscope was                            passed under direct vision. Throughout the                             procedure, the patient's blood pressure, pulse, and                            oxygen saturations were monitored continuously. The                            GIF-H190 ([id number]) Olympus endoscope was introduced                            through the mouth, and advanced to the second part                            of duodenum. The upper GI endoscopy was                            accomplished without difficulty. The patient                            tolerated the procedure well. Scope In: Scope Out: Findings:      One benign-appearing, intrinsic moderate stenosis was found 18 cm from       the incisors. This stenosis measured 1 cm (inner diameter) x less than       one cm (in length). The stenosis was traversed. A TTS dilator was passed       through the scope. Dilation with a 12-13.5-15 mm balloon dilator was       performed to 15 mm. The dilation site was examined following endoscope       reinsertion and showed moderate mucosal disruption and moderate       improvement in luminal narrowing. With moderate mucosal disruptiojn I       elected not to inject steroids at this site. Estimated blood loss was       minimal.      One benign-appearing, intrinsic moderate stenosis was found 30 cm from       the incisors. This stenosis measured 1 cm (inner diameter) x less than       one cm (in length). The stenosis was traversed. A TTS dilator was passed       through the scope. Dilation with a 12-13.5-15 mm balloon dilator was       performed to 15 mm. The dilation  site was examined following endoscope       reinsertion and showed mild mucosal disruption and moderate improvement       in luminal narrowing. Area was successfully injected with 2 mL of       triamcinolone (40 mg/mL) for drug delivery in 4 quadrants. Estimated       blood loss was minimal.      One benign-appearing, intrinsic moderate stenosis was found 39 cm from       the incisors. This stenosis measured 1 cm  (inner diameter) x less than       one cm (in length). The stenosis was traversed. A TTS dilator was passed       through the scope. Dilation with a 12-13.5-15 mm balloon dilator was       performed to 15 mm. The dilation site was examined following endoscope       reinsertion and showed mild mucosal disruption and moderate improvement       in luminal narrowing. Area was successfully injected with 2 mL of       triamcinolone (40 mg/mL) for drug delivery in 4 quadrants. Estimated       blood loss was minimal.      The exam of the esophagus was otherwise normal.      A small-sized hiatal hernia, 2 cm, was present.      The exam of the stomach was otherwise normal.      The duodenal bulb and second portion of the duodenum were normal. Impression:               - Benign-appearing esophageal stenosis. Dilated.                           - Benign-appearing esophageal stenosis. Dilated.                            Injected.                           - Benign-appearing esophageal stenosis. Dilated.                            Injected.                           - Small-sized hiatal hernia.                           - Normal duodenal bulb and second portion of the                            duodenum.                           - No specimens collected. Moderate Sedation:      Not Applicable - Patient had care per Anesthesia. Recommendation:           - Patient has a contact number available for                            emergencies. The signs and symptoms of potential  delayed complications were discussed with the                            patient. Return to normal activities tomorrow.                            Written discharge instructions were provided to the                            patient.                           - Clear liquid diet for 2 hours, then advance as                            tolerated to soft diet today.                           - Resume prior  diet tomorrow.                           - Continue present medications.                           - Return to GI office, Dr. Bryan Lemma, in 6 weeks. Procedure Code(s):        --- Professional ---                           [phone], Esophagogastroduodenoscopy, flexible,                            transoral; with transendoscopic balloon dilation of                            esophagus (less than 30 mm diameter)                           43236, 59, Esophagogastroduodenoscopy, flexible,                            transoral; with directed submucosal injection(s),                            any substance Diagnosis Code(s):        --- Professional ---                           K22.2, Esophageal obstruction                           K44.9, Diaphragmatic hernia without obstruction or                            gangrene                           R13.10, Dysphagia, unspecified CPT copyright 2019 American Medical  Association. All rights reserved. The codes documented in this report are preliminary and upon coder review may  be revised to meet current compliance requirements. Ladene Artist, MD 07/26/2021 8:31:35 AM This report has been signed electronically. Number of Addenda: 0

## 2021-07-26 NOTE — Anesthesia Postprocedure Evaluation (Signed)
Anesthesia Post Note  Patient: Alfred Clay  Procedure(s) Performed: ESOPHAGOGASTRODUODENOSCOPY (EGD) WITH PROPOFOL BALLOON DILATION KENALOG INJECTION     Patient location during evaluation: Endoscopy Anesthesia Type: MAC Level of consciousness: awake and alert Pain management: pain level controlled Vital Signs Assessment: post-procedure vital signs reviewed and stable Respiratory status: spontaneous breathing, nonlabored ventilation, respiratory function stable and patient connected to nasal cannula oxygen Cardiovascular status: stable and blood pressure returned to baseline Postop Assessment: no apparent nausea or vomiting Anesthetic complications: no   No notable events documented.  Last Vitals:  Vitals:   07/26/21 0840 07/26/21 0850  BP: (!) 149/78 (!) 153/73  Pulse: 63 65  Resp: 20 18  Temp:    SpO2: 98% 97%    Last Pain:  Vitals:   07/26/21 0850  TempSrc:   PainSc: 0-No pain                 [name],Khameron DANIEL

## 2021-07-26 NOTE — Interval H&P Note (Signed)
History and Physical Interval Note:  07/26/2021 7:38 AM  Alfred Clay  has presented today for surgery, with the diagnosis of dysphagia, chronic cough.  The various methods of treatment have been discussed with the patient and family. After consideration of risks, benefits and other options for treatment, the patient has consented to  Procedure(s) with comments: ESOPHAGOGASTRODUODENOSCOPY (EGD) WITH PROPOFOL (N/A) - with dilation as a surgical intervention.  The patient's history has been reviewed, patient examined, no change in status, stable for surgery.  I have reviewed the patient's chart and labs.  Questions were answered to the patient's satisfaction.     Pricilla Riffle. Fuller Plan

## 2021-07-27 ENCOUNTER — Encounter (HOSPITAL_COMMUNITY): Payer: Self-pay | Admitting: Gastroenterology

## 2021-08-03 NOTE — Progress Notes (Signed)
Cardiology Office Note:    Date:  08/04/2021   ID:  Alfred Clay, DOB 1950-10-09, MRN [id number]  PCP:  Ileene Rubens   [facility]  Cardiologist:  Candee Furbish, MD  Advanced Practice Provider:  No care team member to display Electrophysiologist:  None       Referring MD: Shelda Pal*     History of Present Illness:    Alfred Clay is a 70 y.o. male here for follow-up of congestive heart failure, hypertension, and coronary artery disease. 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  At 1 point wore a LifeVest.  Coronary artery disease-PCI DES to LAD.  99% lesion. 11/05/2019  Plan 4 weeks EGD. Stricture. May inject steroid.   He was admitted to the hospital on 07/26/21 for an endoscopy. He was no longer taking Brilinta and deemed okay to continue ASA 81 mg in perioperative period.  Today, he is feeling good. Recently, he reports some congestion but, otherwise, has no major concerns. He is compliant and tolerating his medicine. His stricture is doing well after it was stretched.  He denies any palpitations, chest pain, or shortness of breath. No lightheadedness, headaches, syncope, orthopnea, PND, lower extremity edema or exertional symptoms.   Past Medical History:  Diagnosis Date   Acquired esophageal ring    Asthma    uses inhaler if needed   Cancer ([place])    skin- upper back and lower back   Cataract    bilateral-removed   DM type 2 (diabetes mellitus, type 2) ([place])    GERD (gastroesophageal reflux disease)    on meds   History of esophagitis    Hyperlipidemia    on meds   Hypertension    on meds    Past Surgical History:  Procedure Laterality Date   BALLOON DILATION N/A 01/18/2021   Procedure: BALLOON DILATION;  Surgeon: Lavena Bullion, DO;  [facility]: [facility];  Service: Gastroenterology;  Laterality: N/A;   BALLOON DILATION N/A  02/18/2021   Procedure: BALLOON DILATION;  Surgeon: Lavena Bullion, DO;  [facility];  Service: Gastroenterology;  Laterality: N/A;   BALLOON DILATION N/A 07/26/2021   Procedure: BALLOON DILATION;  Surgeon: Ladene Artist, MD;  [facility];  Service: Endoscopy;  Laterality: N/A;   BIOPSY  08/06/2019   Procedure: BIOPSY;  Surgeon: Lavena Bullion, DO;  [facility];  Service: Gastroenterology;;   BIOPSY  01/18/2021   Procedure: BIOPSY;  Surgeon: Lavena Bullion, DO;  [facility];  Service: Gastroenterology;;   CATARACT EXTRACTION Bilateral 10/2018   COLONOSCOPY  2018   CORONARY STENT INTERVENTION N/A 11/05/2019   Procedure: CORONARY STENT INTERVENTION;  Surgeon: Burnell Blanks, MD;  Location: [place] [facility];  Service: Cardiovascular;  Laterality: N/A;   CORONARY/GRAFT ACUTE MI REVASCULARIZATION N/A 11/05/2019   Procedure: Coronary/Graft Acute MI Revascularization;  Surgeon: Burnell Blanks, MD;  Location: [place] [facility];  Service: Cardiovascular;  Laterality: N/A;   ESOPHAGEAL DILATION  12/02/2020   Procedure: ESOPHAGEAL DILATION;  Surgeon: Lavena Bullion, DO;  [facility];  Service: Gastroenterology;;   ESOPHAGOGASTRODUODENOSCOPY  10/16/2018   ESOPHAGOGASTRODUODENOSCOPY (EGD) WITH PROPOFOL N/A 08/06/2019   Procedure: ESOPHAGOGASTRODUODENOSCOPY (EGD) WITH PROPOFOL;  Surgeon: Lavena Bullion, DO;  [facility];  Service: Gastroenterology;  Laterality: N/A;  with dilation using ped scope   ESOPHAGOGASTRODUODENOSCOPY (EGD) WITH PROPOFOL N/A  12/02/2020   Procedure: ESOPHAGOGASTRODUODENOSCOPY (EGD) WITH PROPOFOL;  Surgeon: Lavena Bullion, DO;  [facility];  Service: Gastroenterology;  Laterality: N/A;   ESOPHAGOGASTRODUODENOSCOPY (EGD) WITH PROPOFOL N/A 01/18/2021   Procedure: ESOPHAGOGASTRODUODENOSCOPY (EGD) WITH PROPOFOL;  Surgeon: Lavena Bullion, DO;  [facility];   Service: Gastroenterology;  Laterality: N/A;  with dilation   ESOPHAGOGASTRODUODENOSCOPY (EGD) WITH PROPOFOL N/A 02/18/2021   Procedure: ESOPHAGOGASTRODUODENOSCOPY (EGD) WITH PROPOFOL;  Surgeon: Lavena Bullion, DO;  [facility];  Service: Gastroenterology;  Laterality: N/A;   ESOPHAGOGASTRODUODENOSCOPY (EGD) WITH PROPOFOL N/A 07/26/2021   Procedure: ESOPHAGOGASTRODUODENOSCOPY (EGD) WITH PROPOFOL;  Surgeon: Ladene Artist, MD;  [facility];  Service: Endoscopy;  Laterality: N/A;  with dilation   HEMORRHOID SURGERY     KENALOG INJECTION  07/26/2021   Procedure: KENALOG INJECTION;  Surgeon: Ladene Artist, MD;  [facility];  Service: Endoscopy;;   LEFT HEART CATH AND CORONARY ANGIOGRAPHY N/A 11/05/2019   Procedure: LEFT HEART CATH AND CORONARY ANGIOGRAPHY;  Surgeon: Burnell Blanks, MD;  Location: [place] [facility];  Service: Cardiovascular;  Laterality: N/A;   SAVORY DILATION N/A 08/06/2019   Procedure: SAVORY DILATION;  Surgeon: Lavena Bullion, DO;  [facility];  Service: Gastroenterology;  Laterality: N/A;   SCLEROTHERAPY  01/18/2021   Procedure: SCLEROTHERAPY;  Surgeon: Lavena Bullion, DO;  [facility];  Service: Gastroenterology;;   SKIN CANCER EXCISION     back for skin cancer   SUBMUCOSAL INJECTION  12/02/2020   Procedure: SUBMUCOSAL INJECTION;  Surgeon: Lavena Bullion, DO;  [facility];  Service: Gastroenterology;;   UPPER GASTROINTESTINAL ENDOSCOPY     WISDOM TOOTH EXTRACTION      Current Medications: Current Meds  Medication Sig   albuterol (VENTOLIN HFA) 108 (90 Base) MCG/ACT inhaler Inhale 2 puffs into the lungs every 6 (six) hours as needed for wheezing or shortness of breath.   aspirin EC 81 MG tablet Take 1 tablet (81 mg total) by mouth daily.   atorvastatin (LIPITOR) 80 MG tablet Take 1 tablet (80 mg total) by mouth in the morning.   Cobalamin Combinations (NEURIVA PLUS) CAPS Take 1  capsule by mouth daily.   fluticasone (FLOVENT HFA) 110 MCG/ACT inhaler Inhale 2 puffs into the lungs 2 (two) times daily.   levocetirizine (XYZAL) 5 MG tablet Take 1 tablet (5 mg total) by mouth every evening.   metFORMIN (GLUCOPHAGE) 500 MG tablet TAKE 1 TABLET (500 MG TOTAL) BY MOUTH 2 (TWO) TIMES DAILY WITH A MEAL.   metoprolol succinate (TOPROL-XL) 25 MG 24 hr tablet TAKE 1/2 TABLET EVERY DAY   montelukast (SINGULAIR) 10 MG tablet Take 1 tablet (10 mg total) by mouth at bedtime.   nitroGLYCERIN (NITROSTAT) 0.4 MG SL tablet Place 1 tablet (0.4 mg total) under the tongue every 5 (five) minutes as needed.   pantoprazole (PROTONIX) 40 MG tablet TAKE 1 TABLET TWICE DAILY   sucralfate (CARAFATE) 1 GM/10ML suspension TAKE 10 ML (1 GRAM TOTAL) BY MOUTH FOUR TIMES DAILY.     Allergies:   Ace inhibitors   Social History   Socioeconomic History   Marital status: Married    Spouse name: Not on file   Number of children: Not on file   Years of education: Not on file   Highest education level: Not on file  Occupational History   Not on file  Tobacco Use   Smoking status: Never   Smokeless tobacco: Never  Vaping Use   Vaping  Use: Never used  Substance and Sexual Activity   Alcohol use: Yes    Alcohol/week: 1.0 standard drink    Types: 1 Standard drinks or equivalent per week   Drug use: Not Currently   Sexual activity: Not on file  Other Topics Concern   Not on file  Social History Narrative   Not on file   Social Determinants of Health   Financial Resource Strain: Not on file  Food Insecurity: Not on file  Transportation Needs: Not on file  Physical Activity: Not on file  Stress: Not on file  Social Connections: Not on file     Family History: The patient's family history includes Colon polyps (age of onset: 41) in his mother; Diabetes in his mother. There is no history of Esophageal cancer, Rectal cancer, Stomach cancer, or Colon cancer.  ROS:   Please see the history of  present illness.    (+) Congestion  All other systems reviewed and are negative.  EKGs/Labs/Other Studies Reviewed:    The following studies were reviewed today: Echo 12/23/19 1. Limited study to assess LV function; normal LV systolic and diastolic  function; mild LVH; trace AI.   2. Left ventricular ejection fraction, by estimation, is 60 to 65%. The  left ventricle has normal function. The left ventricle has no regional  wall motion abnormalities. There is mild left ventricular hypertrophy.  Left ventricular diastolic parameters  were normal.   3. Right ventricular systolic function is normal. The right ventricular  size is normal.   4. The mitral valve is normal in structure. Trivial mitral valve  regurgitation. No evidence of mitral stenosis.   5. The aortic valve was not assessed. Aortic valve regurgitation is  trivial.   6. The inferior vena cava is normal in size with greater than 50%  respiratory variability, suggesting right atrial pressure of 3 mmHg.   Echo 11/06/19  1. Left ventricular ejection fraction, by estimation, is 30 to 35%. The  left ventricle has moderately decreased function. The left ventrical  demonstrates regional wall motion abnormalities. There is apical, mid and  apical anterior, apical inferior,  apical inferolateral, mid anteroseptal and inferoseptal, apical lateral  and apical septal akinesis.Left ventricular diastolic parameters are  consistent with Grade I diastolic dysfunction (impaired relaxation).   2. Right ventricular systolic function is normal. The right ventricular  size is normal.   3. The mitral valve is normal in structure and function. no evidence of  mitral valve regurgitation. No evidence of mitral stenosis.   4. The aortic valve is tricuspid. Aortic valve regurgitation is trivial .  Mild to moderate aortic valve sclerosis/calcification is present, without  any evidence of aortic stenosis.   5. The inferior vena cava is dilated in  size with <50% respiratory  variability, suggesting right atrial pressure of 15 mmHg.   L Heart Cath 11/05/19 Prox RCA to Mid RCA lesion is 10% stenosed. 1st Mrg lesion is 30% stenosed. Mid Cx to Dist Cx lesion is 20% stenosed. Prox LAD to Mid LAD lesion is 99% stenosed. Mid LAD lesion is 30% stenosed. Dist LAD lesion is 20% stenosed. A drug-eluting stent was successfully placed using a SYNERGY XD 3.0X32. Post intervention, there is a 0% residual stenosis. The left ventricular systolic function is normal. LV end diastolic pressure is normal. The left ventricular ejection fraction is 35-45% by visual estimate. There is no mitral valve regurgitation. 1. NSTEMI secondary to sub-total occlusion of the mid LAD 2. The LAD is a large  caliber vessel that courses to the apex. The mid vessel has a thrombotic sub-total (99%) occlusion with flow into the distal vessel.  3. The Circumflex has mild disease 4. The RCA is a large dominant vessel with mild mid disease 5. Successful PTCA/DES x 1 mid LAD 6. Moderate segmental LV systolic dysfunction with hypokinesis of the anterior wall, apex and inferoapical wall   EKG:  EKG was not ordered today 12/28/20: sinus rhythm 60 no other abnormalities  Recent Labs: 04/06/2021: ALT 12 04/09/2021: BUN 18; Creatinine, Ser 0.82; Potassium 5.1; Sodium 135  Recent Lipid Panel    Component Value Date/Time   CHOL 159 04/06/2021 0740   TRIG 54.0 04/06/2021 0740   HDL 48.00 04/06/2021 0740   CHOLHDL 3 04/06/2021 0740   VLDL 10.8 04/06/2021 0740   LDLCALC 100 (H) 04/06/2021 0740     Risk Assessment/Calculations:      Physical Exam:    VS:  BP 130/70 (BP Location: Left Arm, Patient Position: Sitting, Cuff Size: Normal)   Pulse 69   Ht 5\' 11"  (1.803 m)   Wt 161 lb (73 kg)   SpO2 98%   BMI 22.45 kg/m     Wt Readings from Last 3 Encounters:  08/04/21 161 lb (73 kg)  07/26/21 163 lb (73.9 kg)  07/13/21 165 lb (74.8 kg)     GEN:  Well nourished, well  developed in no acute distress HEENT: Normal NECK: No JVD; No carotid bruits LYMPHATICS: No lymphadenopathy CARDIAC: RRR, no murmurs, rubs, gallops RESPIRATORY:  Clear to auscultation without rales or rhonchi,  mild wheezing RLL ABDOMEN: Soft, non-tender, non-distended MUSCULOSKELETAL:  No edema; No deformity  SKIN: Warm and dry NEUROLOGIC:  Alert and oriented x 3 PSYCHIATRIC:  Normal affect   ASSESSMENT:    1. Hyperlipidemia, unspecified hyperlipidemia type   2. Coronary artery disease involving native coronary artery of native heart with angina pectoris ([place])   3. Esophageal stenosis   4. Ischemic cardiomyopathy   5. Pure hypercholesterolemia   6. Diabetes mellitus with coincident hypertension (HCC)     PLAN:    Coronary artery disease involving native coronary artery of native heart with angina pectoris ([place]) Well-controlled angina on low-dose Toprol 12.5 mg daily.  Prior stent placement LAD 99% lesion.  Continue with diet and exercise.  He works at [employer] with no restrictions.  EF has improved.  Esophageal stenosis Doing well post esophageal dilatation, prednisone use.  Ischemic cardiomyopathy EF originally 35%.  Much improved.  Continue with low-dose Toprol.  Pure hypercholesterolemia Last LDL from outside labs 100.  Atorvastatin 80 mg.  I will have him sit down with our lipid clinic here to help initiate PCSK9 inhibitor.  Diabetes mellitus with coincident hypertension ([place]) Consider addition of angiotensin receptor blocker in the future, renal protection with his diabetes as well as for blood pressure control.  Coronary artery disease prior anterior STEMI/ischemic cardiomyopathy -Angina overall well controlled, no chest pain, has nitroglycerin as needed.  EF has improved.  Works at [employer] with no restrictions.  Ischemic cardiomyopathy -Post PCI LAD 99% lesion.  EF originally was 30 to 35% but now normal.  Mixed hyperlipidemia -Doing well with atorvastatin high  intensity with no myalgias.  Esophageal stricture -Seeing GI.  According to patient plans on a another EGD in 4 weeks with potential steroid injection.  He may proceed with low to moderate overall cardiac risk given his prior CAD, LAD stent.  We are stopping his Brilinta today since he has been over 1  year dual antiplatelet therapy.  Continuing with aspirin 81 mg.  If aspirin can be continued during procedure I would prefer however if it must be stopped, hold for 5 days.   Medication Adjustments/Labs and Tests Ordered: Current medicines are reviewed at length with the patient today.  Concerns regarding medicines are outlined above.  Orders Placed This Encounter  Procedures   AMB Referral to [facility]    No orders of the defined types were placed in this encounter.   Patient Instructions  Medication Instructions:  The current medical regimen is effective;  continue present plan and medications.  *If you need a refill on your cardiac medications before your next appointment, please call your pharmacy*   You have been referred to Lipid Cinic here at our office with the Pharmacist.  Follow-Up: At [facility], you and your health needs are our priority.  As part of our continuing mission to provide you with exceptional heart care, we have created designated Provider Care Teams.  These Care Teams include your primary Cardiologist (physician) and Advanced Practice Providers (APPs -  Physician Assistants and Nurse Practitioners) who all work together to provide you with the care you need, when you need it.  We recommend signing up for the patient portal called "MyChart".  Sign up information is provided on this After Visit Summary.  MyChart is used to connect with patients for Virtual Visits (Telemedicine).  Patients are able to view lab/test results, encounter notes, upcoming appointments, etc.  Non-urgent messages can be sent to your provider as well.   To learn more about what you  can do with MyChart, go to [email or web address].    Your next appointment:   1 year(s)  The format for your next appointment:   In Person  Provider:   Candee Furbish, MD     Thank you for choosing [place]!!     Wilhemina Bonito as a scribe for Candee Furbish, MD.,have documented all relevant documentation on the behalf of Candee Furbish, MD,as directed by  Candee Furbish, MD while in the presence of Candee Furbish, MD.  I, Candee Furbish, MD, have reviewed all documentation for this visit. The documentation on 08/04/21 for the exam, diagnosis, procedures, and orders are all accurate and complete.   Signed, Candee Furbish, MD  08/04/2021 8:56 AM    [facility]

## 2021-08-04 ENCOUNTER — Ambulatory Visit (INDEPENDENT_AMBULATORY_CARE_PROVIDER_SITE_OTHER): Payer: Medicare Other | Admitting: Cardiology

## 2021-08-04 ENCOUNTER — Encounter: Payer: Self-pay | Admitting: Cardiology

## 2021-08-04 ENCOUNTER — Other Ambulatory Visit: Payer: Self-pay

## 2021-08-04 VITALS — BP 130/70 | HR 69 | Ht 71.0 in | Wt 161.0 lb

## 2021-08-04 DIAGNOSIS — E785 Hyperlipidemia, unspecified: Secondary | ICD-10-CM

## 2021-08-04 DIAGNOSIS — I25119 Atherosclerotic heart disease of native coronary artery with unspecified angina pectoris: Secondary | ICD-10-CM | POA: Diagnosis not present

## 2021-08-04 DIAGNOSIS — I255 Ischemic cardiomyopathy: Secondary | ICD-10-CM | POA: Diagnosis not present

## 2021-08-04 DIAGNOSIS — K222 Esophageal obstruction: Secondary | ICD-10-CM

## 2021-08-04 DIAGNOSIS — E119 Type 2 diabetes mellitus without complications: Secondary | ICD-10-CM

## 2021-08-04 DIAGNOSIS — E7800 Pure hypercholesterolemia, unspecified: Secondary | ICD-10-CM

## 2021-08-04 DIAGNOSIS — I10 Essential (primary) hypertension: Secondary | ICD-10-CM

## 2021-08-04 NOTE — Patient Instructions (Signed)
Medication Instructions:  The current medical regimen is effective;  continue present plan and medications.  *If you need a refill on your cardiac medications before your next appointment, please call your pharmacy*   You have been referred to Lipid Cinic here at our office with the Pharmacist.  Follow-Up: At [facility], you and your health needs are our priority.  As part of our continuing mission to provide you with exceptional heart care, we have created designated Provider Care Teams.  These Care Teams include your primary Cardiologist (physician) and Advanced Practice Providers (APPs -  Physician Assistants and Nurse Practitioners) who all work together to provide you with the care you need, when you need it.  We recommend signing up for the patient portal called "MyChart".  Sign up information is provided on this After Visit Summary.  MyChart is used to connect with patients for Virtual Visits (Telemedicine).  Patients are able to view lab/test results, encounter notes, upcoming appointments, etc.  Non-urgent messages can be sent to your provider as well.   To learn more about what you can do with MyChart, go to [email or web address].    Your next appointment:   1 year(s)  The format for your next appointment:   In Person  Provider:   Candee Furbish, MD     Thank you for choosing [facility]!!

## 2021-08-04 NOTE — Assessment & Plan Note (Signed)
Doing well post esophageal dilatation, prednisone use.

## 2021-08-04 NOTE — Assessment & Plan Note (Signed)
Last LDL from outside labs 100.  Atorvastatin 80 mg.  I will have him sit down with our lipid clinic here to help initiate PCSK9 inhibitor.

## 2021-08-04 NOTE — Assessment & Plan Note (Signed)
EF originally 35%.  Much improved.  Continue with low-dose Toprol.

## 2021-08-04 NOTE — Assessment & Plan Note (Signed)
Consider addition of angiotensin receptor blocker in the future, renal protection with his diabetes as well as for blood pressure control.

## 2021-08-04 NOTE — Assessment & Plan Note (Signed)
Well-controlled angina on low-dose Toprol 12.5 mg daily.  Prior stent placement LAD 99% lesion.  Continue with diet and exercise.  He works at [employer] with no restrictions.  EF has improved.

## 2021-08-06 ENCOUNTER — Other Ambulatory Visit: Payer: Self-pay

## 2021-08-06 ENCOUNTER — Ambulatory Visit (INDEPENDENT_AMBULATORY_CARE_PROVIDER_SITE_OTHER): Payer: Medicare Other | Admitting: Pharmacist

## 2021-08-06 DIAGNOSIS — I255 Ischemic cardiomyopathy: Secondary | ICD-10-CM

## 2021-08-06 DIAGNOSIS — E7800 Pure hypercholesterolemia, unspecified: Secondary | ICD-10-CM | POA: Diagnosis not present

## 2021-08-06 DIAGNOSIS — I25119 Atherosclerotic heart disease of native coronary artery with unspecified angina pectoris: Secondary | ICD-10-CM | POA: Diagnosis not present

## 2021-08-06 NOTE — Progress Notes (Signed)
Patient ID: Alfred Clay                 DOB: 1951-08-27                    MRN: [id number]     HPI: Alfred Clay is a 70 y.o. male patient referred to lipid clinic by Dr Marlou Porch. PMH is significant for CAD, hx of NSTEMI, CHF, HTN and T2DM (A1c 7.4). Patient moved to [place] from [place] to continue working for [employer] but then had NSTEMI on 11/05/19.  Discharged on atorvastatin 80mg  daily.  Last LDL 100 on 04/06/21 and patient was referred to lipid clinic.  Patient and wife Alfred Clay present today in good spirits.  Has had no adverse effects of myalgias with rosuvastatin and reports compliance.  Reports a strong family history of CAD on both sides of his family.  Wife does most of the cooking at home and they infrequently eat out.  However he does have prepackaged sausage biscuits in the morning and hot dogs on a stick.  Wife cooks potatoes, beans, fish, chicken and beef.  Not many vegetables. He no longer adds salt to his meals.  He reports is not extremely physically active but can move around his house without diffculty.     Current Medications: atorvastatin 80mg  Intolerances: N/A Risk Factors: DM, CAD, family history, hx of NSTEMI LDL goal: <55 Family History: mother and father CAD  Labs:TC 50, LDL 100, HDL 72, Trigs 63 (04/06/21 on atorvastatin 80)  Past Medical History:  Diagnosis Date   Acquired esophageal ring    Asthma    uses inhaler if needed   Cancer ([place])    skin- upper back and lower back   Cataract    bilateral-removed   DM type 2 (diabetes mellitus, type 2) (HCC)    GERD (gastroesophageal reflux disease)    on meds   History of esophagitis    Hyperlipidemia    on meds   Hypertension    on meds    Current Outpatient Medications on File Prior to Visit  Medication Sig Dispense Refill   albuterol (VENTOLIN HFA) 108 (90 Base) MCG/ACT inhaler Inhale 2 puffs into the lungs every 6 (six) hours as needed for wheezing or shortness of breath. 18 g 1   aspirin EC 81 MG  tablet Take 1 tablet (81 mg total) by mouth daily. 90 tablet 3   atorvastatin (LIPITOR) 80 MG tablet Take 1 tablet (80 mg total) by mouth in the morning. 90 tablet 1   Cobalamin Combinations (NEURIVA PLUS) CAPS Take 1 capsule by mouth daily.     fluticasone (FLOVENT HFA) 110 MCG/ACT inhaler Inhale 2 puffs into the lungs 2 (two) times daily.     levocetirizine (XYZAL) 5 MG tablet Take 1 tablet (5 mg total) by mouth every evening. 30 tablet 2   metFORMIN (GLUCOPHAGE) 500 MG tablet TAKE 1 TABLET (500 MG TOTAL) BY MOUTH 2 (TWO) TIMES DAILY WITH A MEAL. 180 tablet 2   metoprolol succinate (TOPROL-XL) 25 MG 24 hr tablet TAKE 1/2 TABLET EVERY DAY 45 tablet 3   montelukast (SINGULAIR) 10 MG tablet Take 1 tablet (10 mg total) by mouth at bedtime. 30 tablet 3   nitroGLYCERIN (NITROSTAT) 0.4 MG SL tablet Place 1 tablet (0.4 mg total) under the tongue every 5 (five) minutes as needed. 25 tablet 12   pantoprazole (PROTONIX) 40 MG tablet TAKE 1 TABLET TWICE DAILY 180 tablet 0   sucralfate (CARAFATE) 1  GM/10ML suspension TAKE 10 ML (1 GRAM TOTAL) BY MOUTH FOUR TIMES DAILY. 420 mL 1   No current facility-administered medications on file prior to visit.    Allergies  Allergen Reactions   Ace Inhibitors Cough    Assessment/Plan:  1. Hyperlipidemia - Patient's most recent LDL 100 which is above goal of less than 55. Aggressive goal selected due to patient's DM, family history, and hx of NSTEMI. Since patient already on high intensity statin, needs further lipid lowering with PCSK9i.  Using demo pen educated patient on mechanism of action, storage, site selection, administration, and possible adverse effects.  Patient and wife voiced understanding.  Will complete PA and contact wife when complete.  Recheck lipid panel in 2-3 months.  Recommended patient try to eat a more heart healthy diet.  Recommended increasing vegetables, fruits, whole grains, healthy fats, and lean proteins.  Recommended patient increase  physical activity up to a minimum of 30 minutes daily at least 5 days a week.  Continue atorvastatin 80mg  daily Start Repatha/Praluent SQ q 14 days Recheck lipid panel in 2-3 months  Karren Cobble, PharmD, BCACP, [place], [place] [address] [place], [place] [address] Phone: [phone]; Fax: [phone] 08/06/2021 12:04 PM

## 2021-08-06 NOTE — Patient Instructions (Addendum)
It was nice meeting you today  We would like your LDL (bad cholesterol) to be less than 55  Continue your atorvastatin 80mg  once a day  We will start a new medication you will inject once every 2 weeks  We will complete the prior authorization for you and contact you when it is approved  Once you start the medication we will check your cholesterol levels in 2-3 months  Try to cut down on the processed foods.  Concentrate on fruits, vegetables, lean proteins, healthy fats, and whole grains  Try to increase your physical activity up to at least 30 minutes a day at least 5 days a week  Please call with any questions!  Karren Cobble, PharmD, BCACP, [place], [place] [address], [place], [place] [address] Phone: [phone]; Fax: [phone] 08/06/2021 10:59 AM

## 2021-09-07 ENCOUNTER — Ambulatory Visit (INDEPENDENT_AMBULATORY_CARE_PROVIDER_SITE_OTHER): Payer: Medicare Other

## 2021-09-07 VITALS — Ht 71.0 in | Wt 161.0 lb

## 2021-09-07 DIAGNOSIS — Z0000 Encounter for general adult medical examination without abnormal findings: Secondary | ICD-10-CM | POA: Diagnosis not present

## 2021-09-07 NOTE — Patient Instructions (Signed)
Mr. Alfred Clay , Thank you for taking time to complete your Medicare Wellness Visit. I appreciate your ongoing commitment to your health goals. Please review the following plan we discussed and let me know if I can assist you in the future.   Screening recommendations/referrals: Colonoscopy:  Completed 10/15/2016. Repeat every 10 years Recommended yearly ophthalmology/optometry visit for glaucoma screening and checkup Recommended yearly dental visit for hygiene and checkup  Vaccinations: Influenza vaccine: Up to date Pneumococcal vaccine: Due-May obtain vaccine at our office or your local pharmacy. Tdap vaccine: Discuss with pharmacy Shingles vaccine: Discuss with pharmacy   Covid-19: Booster available at the pharmacy  Advanced directives: Declined information today  Conditions/risks identified: See problem list  Next appointment: Follow up in one year for your annual wellness visit. 09/09/2022 @ 2:20 ( phone visit)  Preventive Care 70 Years and Older, Male Preventive care refers to lifestyle choices and visits with your health care provider that can promote health and wellness. What does preventive care include? A yearly physical exam. This is also called an annual well check. Dental exams once or twice a year. Routine eye exams. Ask your health care provider how often you should have your eyes checked. Personal lifestyle choices, including: Daily care of your teeth and gums. Regular physical activity. Eating a healthy diet. Avoiding tobacco and drug use. Limiting alcohol use. Practicing safe sex. Taking low doses of aspirin every day. Taking vitamin and mineral supplements as recommended by your health care provider. What happens during an annual well check? The services and screenings done by your health care provider during your annual well check will depend on your age, overall health, lifestyle risk factors, and family history of disease. Counseling  Your health care  provider may ask you questions about your: Alcohol use. Tobacco use. Drug use. Emotional well-being. Home and relationship well-being. Sexual activity. Eating habits. History of falls. Memory and ability to understand (cognition). Work and work Statistician. Screening  You may have the following tests or measurements: Height, weight, and BMI. Blood pressure. Lipid and cholesterol levels. These may be checked every 5 years, or more frequently if you are over 65 years old. Skin check. Lung cancer screening. You may have this screening every year starting at age 47 if you have a 30-pack-year history of smoking and currently smoke or have quit within the past 15 years. Fecal occult blood test (FOBT) of the stool. You may have this test every year starting at age 43. Flexible sigmoidoscopy or colonoscopy. You may have a sigmoidoscopy every 5 years or a colonoscopy every 10 years starting at age 37. Prostate cancer screening. Recommendations will vary depending on your family history and other risks. Hepatitis C blood test. Hepatitis B blood test. Sexually transmitted disease (STD) testing. Diabetes screening. This is done by checking your blood sugar (glucose) after you have not eaten for a while (fasting). You may have this done every 1-3 years. Abdominal aortic aneurysm (AAA) screening. You may need this if you are a current or former smoker. Osteoporosis. You may be screened starting at age 52 if you are at high risk. Talk with your health care provider about your test results, treatment options, and if necessary, the need for more tests. Vaccines  Your health care provider may recommend certain vaccines, such as: Influenza vaccine. This is recommended every year. Tetanus, diphtheria, and acellular pertussis (Tdap, Td) vaccine. You may need a Td booster every 10 years. Zoster vaccine. You may need this after age 21. Pneumococcal 13-valent  conjugate (PCV13) vaccine. One dose is  recommended after age 75. Pneumococcal polysaccharide (PPSV23) vaccine. One dose is recommended after age 68. Talk to your health care provider about which screenings and vaccines you need and how often you need them. This information is not intended to replace advice given to you by your health care provider. Make sure you discuss any questions you have with your health care provider. Document Released: 10/09/2015 Document Revised: 06/01/2016 Document Reviewed: 07/14/2015 Elsevier Interactive Patient Education  2017 [place] Prevention in the Home Falls can cause injuries. They can happen to people of all ages. There are many things you can do to make your home safe and to help prevent falls. What can I do on the outside of my home? Regularly fix the edges of walkways and driveways and fix any cracks. Remove anything that might make you trip as you walk through a door, such as a raised step or threshold. Trim any bushes or trees on the path to your home. Use bright outdoor lighting. Clear any walking paths of anything that might make someone trip, such as rocks or tools. Regularly check to see if handrails are loose or broken. Make sure that both sides of any steps have handrails. Any raised decks and porches should have guardrails on the edges. Have any leaves, snow, or ice cleared regularly. Use sand or salt on walking paths during winter. Clean up any spills in your garage right away. This includes oil or grease spills. What can I do in the bathroom? Use night lights. Install grab bars by the toilet and in the tub and shower. Do not use towel bars as grab bars. Use non-skid mats or decals in the tub or shower. If you need to sit down in the shower, use a plastic, non-slip stool. Keep the floor dry. Clean up any water that spills on the floor as soon as it happens. Remove soap buildup in the tub or shower regularly. Attach bath mats securely with double-sided non-slip rug  tape. Do not have throw rugs and other things on the floor that can make you trip. What can I do in the bedroom? Use night lights. Make sure that you have a light by your bed that is easy to reach. Do not use any sheets or blankets that are too big for your bed. They should not hang down onto the floor. Have a firm chair that has side arms. You can use this for support while you get dressed. Do not have throw rugs and other things on the floor that can make you trip. What can I do in the kitchen? Clean up any spills right away. Avoid walking on wet floors. Keep items that you use a lot in easy-to-reach places. If you need to reach something above you, use a strong step stool that has a grab bar. Keep electrical cords out of the way. Do not use floor polish or wax that makes floors slippery. If you must use wax, use non-skid floor wax. Do not have throw rugs and other things on the floor that can make you trip. What can I do with my stairs? Do not leave any items on the stairs. Make sure that there are handrails on both sides of the stairs and use them. Fix handrails that are broken or loose. Make sure that handrails are as long as the stairways. Check any carpeting to make sure that it is firmly attached to the stairs. Fix any carpet that  is loose or worn. Avoid having throw rugs at the top or bottom of the stairs. If you do have throw rugs, attach them to the floor with carpet tape. Make sure that you have a light switch at the top of the stairs and the bottom of the stairs. If you do not have them, ask someone to add them for you. What else can I do to help prevent falls? Wear shoes that: Do not have high heels. Have rubber bottoms. Are comfortable and fit you well. Are closed at the toe. Do not wear sandals. If you use a stepladder: Make sure that it is fully opened. Do not climb a closed stepladder. Make sure that both sides of the stepladder are locked into place. Ask someone to  hold it for you, if possible. Clearly mark and make sure that you can see: Any grab bars or handrails. First and last steps. Where the edge of each step is. Use tools that help you move around (mobility aids) if they are needed. These include: Canes. Walkers. Scooters. Crutches. Turn on the lights when you go into a dark area. Replace any light bulbs as soon as they burn out. Set up your furniture so you have a clear path. Avoid moving your furniture around. If any of your floors are uneven, fix them. If there are any pets around you, be aware of where they are. Review your medicines with your doctor. Some medicines can make you feel dizzy. This can increase your chance of falling. Ask your doctor what other things that you can do to help prevent falls. This information is not intended to replace advice given to you by your health care provider. Make sure you discuss any questions you have with your health care provider. Document Released: 07/09/2009 Document Revised: 02/18/2016 Document Reviewed: 10/17/2014 Elsevier Interactive Patient Education  2017 [employer].

## 2021-09-07 NOTE — Progress Notes (Signed)
Subjective:   Alfred Clay is a 70 y.o. male who presents for an Initial Medicare Annual Wellness Visit.  I connected with Vernor today by telephone and verified that I am speaking with the correct person using two identifiers. Location patient: home Location provider: work Persons participating in the virtual visit: patient, Marine scientist.    I discussed the limitations, risks, security and privacy concerns of performing an evaluation and management service by telephone and the availability of in person appointments. I also discussed with the patient that there may be a patient responsible charge related to this service. The patient expressed understanding and verbally consented to this telephonic visit.    Interactive audio and video telecommunications were attempted between this provider and patient, however failed, due to patient having technical difficulties OR patient did not have access to video capability.  We continued and completed visit with audio only.  Some vital signs may be absent or patient reported.   Time Spent with patient on telephone encounter: 20 minutes   Review of Systems     Cardiac Risk Factors include: advanced age (>30men, >24 women);dyslipidemia;diabetes mellitus;hypertension;male gender     Objective:    Today's Vitals   09/07/21 1354  Weight: 161 lb (73 kg)  Height: 5\' 11"  (1.803 m)   Body mass index is 22.45 kg/m.  Advanced Directives 09/07/2021 07/26/2021 02/18/2021 01/18/2021 12/02/2020 12/19/2019 11/05/2019  Does Patient Have a Medical Advance Directive? No No No No Yes Yes No  Type of Advance Directive - - - - Clinical cytogeneticist of [place];Living will -  Does patient want to make changes to medical advance directive? No - Patient declined - - - - No - Patient declined -  Copy of [place] in Chart? - - - - No - copy requested No - copy requested -  Would patient like information on creating a medical  advance directive? - No - Patient declined No - Patient declined No - Patient declined - - No - Patient declined    Current Medications (verified) Outpatient Encounter Medications as of 09/07/2021  Medication Sig   albuterol (VENTOLIN HFA) 108 (90 Base) MCG/ACT inhaler Inhale 2 puffs into the lungs every 6 (six) hours as needed for wheezing or shortness of breath.   aspirin EC 81 MG tablet Take 1 tablet (81 mg total) by mouth daily.   atorvastatin (LIPITOR) 80 MG tablet Take 1 tablet (80 mg total) by mouth in the morning.   Cobalamin Combinations (NEURIVA PLUS) CAPS Take 1 capsule by mouth daily.   fluticasone (FLOVENT HFA) 110 MCG/ACT inhaler Inhale 2 puffs into the lungs 2 (two) times daily.   levocetirizine (XYZAL) 5 MG tablet Take 1 tablet (5 mg total) by mouth every evening.   metFORMIN (GLUCOPHAGE) 500 MG tablet TAKE 1 TABLET (500 MG TOTAL) BY MOUTH 2 (TWO) TIMES DAILY WITH A MEAL.   metoprolol succinate (TOPROL-XL) 25 MG 24 hr tablet TAKE 1/2 TABLET EVERY DAY   montelukast (SINGULAIR) 10 MG tablet Take 1 tablet (10 mg total) by mouth at bedtime.   nitroGLYCERIN (NITROSTAT) 0.4 MG SL tablet Place 1 tablet (0.4 mg total) under the tongue every 5 (five) minutes as needed.   pantoprazole (PROTONIX) 40 MG tablet TAKE 1 TABLET TWICE DAILY   sucralfate (CARAFATE) 1 GM/10ML suspension TAKE 10 ML (1 GRAM TOTAL) BY MOUTH FOUR TIMES DAILY.   No facility-administered encounter medications on file as of 09/07/2021.    Allergies (verified) Ace inhibitors  History: Past Medical History:  Diagnosis Date   Acquired esophageal ring    Asthma    uses inhaler if needed   Cancer ([place])    skin- upper back and lower back   Cataract    bilateral-removed   DM type 2 (diabetes mellitus, type 2) ([place])    GERD (gastroesophageal reflux disease)    on meds   History of esophagitis    Hyperlipidemia    on meds   Hypertension    on meds   Past Surgical History:  Procedure Laterality Date    BALLOON DILATION N/A 01/18/2021   Procedure: BALLOON DILATION;  Surgeon: Lavena Bullion, DO;  [facility]: [facility];  Service: Gastroenterology;  Laterality: N/A;   BALLOON DILATION N/A 02/18/2021   Procedure: BALLOON DILATION;  Surgeon: Lavena Bullion, DO;  [facility];  Service: Gastroenterology;  Laterality: N/A;   BALLOON DILATION N/A 07/26/2021   Procedure: BALLOON DILATION;  Surgeon: Ladene Artist, MD;  [facility];  Service: Endoscopy;  Laterality: N/A;   BIOPSY  08/06/2019   Procedure: BIOPSY;  Surgeon: Lavena Bullion, DO;  [facility];  Service: Gastroenterology;;   BIOPSY  01/18/2021   Procedure: BIOPSY;  Surgeon: Lavena Bullion, DO;  [facility];  Service: Gastroenterology;;   CATARACT EXTRACTION Bilateral 10/2018   COLONOSCOPY  2018   CORONARY STENT INTERVENTION N/A 11/05/2019   Procedure: CORONARY STENT INTERVENTION;  Surgeon: Burnell Blanks, MD;  Location: [place] [facility];  Service: Cardiovascular;  Laterality: N/A;   CORONARY/GRAFT ACUTE MI REVASCULARIZATION N/A 11/05/2019   Procedure: Coronary/Graft Acute MI Revascularization;  Surgeon: Burnell Blanks, MD;  Location: [place] [facility];  Service: Cardiovascular;  Laterality: N/A;   ESOPHAGEAL DILATION  12/02/2020   Procedure: ESOPHAGEAL DILATION;  Surgeon: Lavena Bullion, DO;  [facility];  Service: Gastroenterology;;   ESOPHAGOGASTRODUODENOSCOPY  10/16/2018   ESOPHAGOGASTRODUODENOSCOPY (EGD) WITH PROPOFOL N/A 08/06/2019   Procedure: ESOPHAGOGASTRODUODENOSCOPY (EGD) WITH PROPOFOL;  Surgeon: Lavena Bullion, DO;  [facility];  Service: Gastroenterology;  Laterality: N/A;  with dilation using ped scope   ESOPHAGOGASTRODUODENOSCOPY (EGD) WITH PROPOFOL N/A 12/02/2020   Procedure: ESOPHAGOGASTRODUODENOSCOPY (EGD) WITH PROPOFOL;  Surgeon: Lavena Bullion, DO;  [facility];  Service: Gastroenterology;  Laterality: N/A;    ESOPHAGOGASTRODUODENOSCOPY (EGD) WITH PROPOFOL N/A 01/18/2021   Procedure: ESOPHAGOGASTRODUODENOSCOPY (EGD) WITH PROPOFOL;  Surgeon: Lavena Bullion, DO;  [facility];  Service: Gastroenterology;  Laterality: N/A;  with dilation   ESOPHAGOGASTRODUODENOSCOPY (EGD) WITH PROPOFOL N/A 02/18/2021   Procedure: ESOPHAGOGASTRODUODENOSCOPY (EGD) WITH PROPOFOL;  Surgeon: Lavena Bullion, DO;  [facility];  Service: Gastroenterology;  Laterality: N/A;   ESOPHAGOGASTRODUODENOSCOPY (EGD) WITH PROPOFOL N/A 07/26/2021   Procedure: ESOPHAGOGASTRODUODENOSCOPY (EGD) WITH PROPOFOL;  Surgeon: Ladene Artist, MD;  [facility];  Service: Endoscopy;  Laterality: N/A;  with dilation   HEMORRHOID SURGERY     KENALOG INJECTION  07/26/2021   Procedure: KENALOG INJECTION;  Surgeon: Ladene Artist, MD;  [facility];  Service: Endoscopy;;   LEFT HEART CATH AND CORONARY ANGIOGRAPHY N/A 11/05/2019   Procedure: LEFT HEART CATH AND CORONARY ANGIOGRAPHY;  Surgeon: Burnell Blanks, MD;  Location: [place] [facility];  Service: Cardiovascular;  Laterality: N/A;   SAVORY DILATION N/A 08/06/2019   Procedure: SAVORY DILATION;  Surgeon: Lavena Bullion, DO;  [facility];  Service: Gastroenterology;  Laterality: N/A;   SCLEROTHERAPY  01/18/2021   Procedure: SCLEROTHERAPY;  Surgeon: Bryan Lemma,  [name], DO;  [facility];  Service: Gastroenterology;;   SKIN CANCER EXCISION     back for skin cancer   SUBMUCOSAL INJECTION  12/02/2020   Procedure: SUBMUCOSAL INJECTION;  Surgeon: Lavena Bullion, DO;  [facility];  Service: Gastroenterology;;   UPPER GASTROINTESTINAL ENDOSCOPY     WISDOM TOOTH EXTRACTION     Family History  Problem Relation Age of Onset   Diabetes Mother    Colon polyps Mother 15   Esophageal cancer Neg Hx    Rectal cancer Neg Hx    Stomach cancer Neg Hx    Colon cancer Neg Hx    Social History   Socioeconomic History    Marital status: Married    Spouse name: Not on file   Number of children: Not on file   Years of education: Not on file   Highest education level: Not on file  Occupational History   Not on file  Tobacco Use   Smoking status: Never   Smokeless tobacco: Never  Vaping Use   Vaping Use: Never used  Substance and Sexual Activity   Alcohol use: Yes    Alcohol/week: 1.0 standard drink    Types: 1 Standard drinks or equivalent per week   Drug use: Not Currently   Sexual activity: Not on file  Other Topics Concern   Not on file  Social History Narrative   Not on file   Social Determinants of Health   Financial Resource Strain: Low Risk    Difficulty of Paying Living Expenses: Not hard at all  Food Insecurity: No Food Insecurity   Worried About Charity fundraiser in the Last Year: Never true   [place] in the Last Year: Never true  Transportation Needs: No Transportation Needs   Lack of Transportation (Medical): No   Lack of Transportation (Non-Medical): No  Physical Activity: Inactive   Days of Exercise per Week: 0 days   Minutes of Exercise per Session: 0 min  Stress: No Stress Concern Present   Feeling of Stress : Not at all  Social Connections: Moderately Isolated   Frequency of Communication with Friends and Family: More than three times a week   Frequency of Social Gatherings with Friends and Family: More than three times a week   Attends Religious Services: Never   Marine scientist or Organizations: No   Attends Music therapist: Never   Marital Status: Married    Tobacco Counseling Counseling given: Not Answered   Clinical Intake:  Pre-visit preparation completed: Yes  Pain : No/denies pain     BMI - recorded: 22.45 Nutritional Risks: None Diabetes: Yes CBG done?: No Did pt. bring in CBG monitor from home?: No (phone visit)  How often do you need to have someone help you when you read instructions, pamphlets, or other written  materials from your doctor or pharmacy?: 1 - Never  Diabetes:  Is the patient diabetic?  Yes  If diabetic, was a CBG obtained today?  No  Did the patient bring in their glucometer from home?  No phone visit How often do you monitor your CBG's? occasionally.   Financial Strains and Diabetes Management:  Are you having any financial strains with the device, your supplies or your medication? No .  Does the patient want to be seen by Chronic Care Management for management of their diabetes?  No  Would the patient like to be referred to a Nutritionist or for Diabetic  Management?  No   Diabetic Exams:  Diabetic Eye Exam: Completed 01/07/2021.   Diabetic Foot Exam: Completed 10/06/2020.    Interpreter Needed?: No  Information entered by :: Caroleen Hamman LPN   Activities of Daily Living In your present state of health, do you have any difficulty performing the following activities: 09/07/2021 04/05/2021  Hearing? N N  Vision? N N  Difficulty concentrating or making decisions? N N  Walking or climbing stairs? N N  Dressing or bathing? N N  Doing errands, shopping? N N  Preparing Food and eating ? N -  Using the Toilet? N -  In the past six months, have you accidently leaked urine? N -  Do you have problems with loss of bowel control? N -  Managing your Medications? N -  Managing your Finances? N -  Housekeeping or managing your Housekeeping? N -  Some recent data might be hidden    Patient Care Team: Shelda Pal, DO as PCP - General (Family Medicine) Jerline Pain, MD as PCP - Cardiology (Cardiology)  Indicate any recent Medical Services you may have received from other than [facility] providers in the past year (date may be approximate).     Assessment:   This is a routine wellness examination for Donnell.  Hearing/Vision screen Hearing Screening - Comments:: No issues Vision Screening - Comments:: Last eye exam-12/2020  Dietary issues and exercise activities  discussed: Current Exercise Habits: The patient does not participate in regular exercise at present, Exercise limited by: None identified   Goals Addressed             This Visit's Progress    Patient Stated       Continue eating a low cholesterol diet       Depression Screen PHQ 2/9 Scores 09/07/2021 04/05/2021 12/19/2019  PHQ - 2 Score 0 0 0  PHQ- 9 Score - - 1    Fall Risk Fall Risk  09/07/2021 04/05/2021  Falls in the past year? 0 0  Number falls in past yr: 0 0  Injury with Fall? 0 0  Risk for fall due to : - No Fall Risks  Follow up Falls prevention discussed Falls evaluation completed    [place]:  Any stairs in or around the home? Yes  If so, are there any without handrails? No  Home free of loose throw rugs in walkways, pet beds, electrical cords, etc? Yes  Adequate lighting in your home to reduce risk of falls? Yes   ASSISTIVE DEVICES UTILIZED TO PREVENT FALLS:  Life alert? No  Use of a cane, walker or w/c? No  Grab bars in the bathroom? No  Shower chair or bench in shower? No  Elevated toilet seat or a handicapped toilet? No   TIMED UP AND GO:  Was the test performed? No . Phone visit   Cognitive Function:Normal cognitive status assessed by this Nurse Health Advisor. No abnormalities found.          Immunizations Immunization History  Administered Date(s) Administered   Fluad Quad(high Dose 65+) 10/16/2019, 10/06/2020, 07/13/2021   Moderna SARS-COV2 Booster Vaccination 10/01/2020   Moderna Sars-Covid-2 Vaccination 12/13/2019, 01/14/2020    TDAP status: Due, Education has been provided regarding the importance of this vaccine. Advised may receive this vaccine at local pharmacy or Health Dept. Aware to provide a copy of the vaccination record if obtained from local pharmacy or Health Dept. Verbalized acceptance and understanding.  Flu Vaccine  status: Up to date  Pneumococcal vaccine status: Due, Education has  been provided regarding the importance of this vaccine. Advised may receive this vaccine at local pharmacy or Health Dept. Aware to provide a copy of the vaccination record if obtained from local pharmacy or Health Dept. Verbalized acceptance and understanding.  Covid-19 vaccine status: Information provided on how to obtain vaccines.   Qualifies for Shingles Vaccine? Yes   Zostavax completed No   Shingrix Completed?: No.    Education has been provided regarding the importance of this vaccine. Patient has been advised to call insurance company to determine out of pocket expense if they have not yet received this vaccine. Advised may also receive vaccine at local pharmacy or Health Dept. Verbalized acceptance and understanding.  Screening Tests Health Maintenance  Topic Date Due   Pneumonia Vaccine 16+ Years old (1 - PCV) Never done   Zoster Vaccines- Shingrix (1 of 2) Never done   COVID-19 Vaccine (3 - Moderna risk series) 10/29/2020   URINE MICROALBUMIN  10/06/2021   FOOT EXAM  10/06/2021   HEMOGLOBIN A1C  10/07/2021   OPHTHALMOLOGY EXAM  01/07/2022   COLONOSCOPY (Pts 45-59yrs Insurance coverage will need to be confirmed)  10/15/2026   INFLUENZA VACCINE  Completed   Hepatitis C Screening  Completed   HPV VACCINES  Aged Out   TETANUS/TDAP  Discontinued    Health Maintenance  Health Maintenance Due  Topic Date Due   Pneumonia Vaccine 31+ Years old (1 - PCV) Never done   Zoster Vaccines- Shingrix (1 of 2) Never done   COVID-19 Vaccine (3 - Moderna risk series) 10/29/2020   URINE MICROALBUMIN  10/06/2021    Colorectal cancer screening: Type of screening: Colonoscopy. Completed 10/15/2016. Repeat every 10 years  Lung Cancer Screening: (Low Dose CT Chest recommended if Age 67-80 years, 30 pack-year currently smoking OR have quit w/in 15years.) does not qualify.     Additional Screening:  Hepatitis C Screening: Completed 10/16/2019  Vision Screening: Recommended annual  ophthalmology exams for early detection of glaucoma and other disorders of the eye. Is the patient up to date with their annual eye exam?  Yes  Who is the provider or what is the name of the office in which the patient attends annual eye exams? [place] Screening: Recommended annual dental exams for proper oral hygiene  Community Resource Referral / Chronic Care Management: CRR required this visit?  No   CCM required this visit?  No      Plan:     I have personally reviewed and noted the following in the patients chart:   Medical and social history Use of alcohol, tobacco or illicit drugs  Current medications and supplements including opioid prescriptions. Patient is not currently taking opioid prescriptions. Functional ability and status Nutritional status Physical activity Advanced directives List of other physicians Hospitalizations, surgeries, and ER visits in previous 12 months Vitals Screenings to include cognitive, depression, and falls Referrals and appointments  In addition, I have reviewed and discussed with patient certain preventive protocols, quality metrics, and best practice recommendations. A written personalized care plan for preventive services as well as general preventive health recommendations were provided to patient.   Due to this being a telephonic visit, the after visit summary with patients personalized plan was offered to patient via mail or my-chart. Patient would like to access on my-chart.   Marta Antu, LPN   [id number]  Nurse health Advisor  Nurse Notes: None

## 2021-09-12 ENCOUNTER — Other Ambulatory Visit: Payer: Self-pay | Admitting: Gastroenterology

## 2021-09-22 ENCOUNTER — Encounter: Payer: Self-pay | Admitting: Pharmacist

## 2021-09-22 ENCOUNTER — Telehealth: Payer: Self-pay | Admitting: Pharmacist

## 2021-09-22 DIAGNOSIS — E7800 Pure hypercholesterolemia, unspecified: Secondary | ICD-10-CM

## 2021-09-22 NOTE — Telephone Encounter (Signed)
Pa submitted for repatha. Alfred Clay (Key: [id number])

## 2021-09-22 NOTE — Telephone Encounter (Signed)
Please complete prior authorization for:  Name of medication, dose, and frequency Repatha 140mg  sq q 14 days or Praluent 75mg  sq q 14 days  Lab Orders Requested? yes  Which labs? Lipid panel  Estimated date for labs to be scheduled 2-3 months  Does patient need activated copay card? no

## 2021-09-23 MED ORDER — REPATHA SURECLICK 140 MG/ML ~~LOC~~ SOAJ: 140.0000 mg | SUBCUTANEOUS | 11 refills | Status: DC

## 2021-09-23 NOTE — Telephone Encounter (Signed)
Repatha pa approved until 03/21/22, lipid panel ordered and released. Rxsent. Will notify patient via mychart msg so he can provide the healthwell card number since he was approved for $2500 to help cover the cost of the med.

## 2021-09-23 NOTE — Addendum Note (Signed)
Addended by: Allean Found on: 09/23/2021 03:56 PM   Modules accepted: Orders

## 2021-10-20 ENCOUNTER — Encounter (HOSPITAL_COMMUNITY)
Admission: RE | Disposition: A | Discharge status: Home / Self Care | Payer: Self-pay | Source: Home / Self Care | Attending: Gastroenterology

## 2021-10-20 ENCOUNTER — Encounter (HOSPITAL_COMMUNITY): Payer: Self-pay | Admitting: Gastroenterology

## 2021-10-20 ENCOUNTER — Ambulatory Visit (HOSPITAL_COMMUNITY)
Admission: RE | Admit: 2021-10-20 | Discharge: 2021-10-20 | Disposition: A | Discharge status: Home / Self Care | Payer: Medicare Other | Attending: Gastroenterology | Admitting: Gastroenterology

## 2021-10-20 DIAGNOSIS — R1310 Dysphagia, unspecified: Secondary | ICD-10-CM | POA: Insufficient documentation

## 2021-10-20 DIAGNOSIS — R1319 Other dysphagia: Secondary | ICD-10-CM | POA: Diagnosis not present

## 2021-10-20 HISTORY — PX: ESOPHAGEAL MANOMETRY: SHX5429

## 2021-10-20 SURGERY — MANOMETRY, ESOPHAGUS

## 2021-10-20 MED ORDER — LIDOCAINE VISCOUS HCL 2 % MT SOLN
OROMUCOSAL | Status: AC
  Filled 2021-10-20: qty 15

## 2021-10-20 SURGICAL SUPPLY — 2 items
FACESHIELD LNG OPTICON STERILE (SAFETY) IMPLANT
GLOVE BIO SURGEON STRL SZ8 (GLOVE) ×4 IMPLANT

## 2021-10-20 NOTE — Progress Notes (Signed)
Esophageal Manometry done per protocol. Patient tolerated procedure with persistent coughing.  Patient felt he was unable to proceed with 24 hour pH due to coughing so pH was cancelled.  Report to be sent to Harl Bowie, MD.

## 2021-10-21 ENCOUNTER — Encounter (HOSPITAL_COMMUNITY): Payer: Self-pay | Admitting: Gastroenterology

## 2021-11-15 LAB — LIPID PANEL
Chol/HDL Ratio: 2.1 ratio (ref 0.0–5.0)
Cholesterol, Total: 109 mg/dL (ref 100–199)
HDL: 52 mg/dL (ref >39)
LDL Chol Calc (NIH): 43 mg/dL (ref 0–99)
Triglycerides: 65 mg/dL (ref 0–149)
VLDL Cholesterol Cal: 14 mg/dL (ref 5–40)

## 2021-11-17 ENCOUNTER — Telehealth: Payer: Self-pay | Admitting: General Surgery

## 2021-11-17 NOTE — Telephone Encounter (Signed)
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  Can we please confirm that this is with the Esophageal Clinic.  Can otherwise follow-up with me in the GI clinic here in the interim.

## 2021-11-17 NOTE — Telephone Encounter (Signed)
Spoke with the patient and he did see the EM report on his mychart. We went over the test and the impression and recommendations. The patient confirmed his appointment at [place] is with the [place] clinic. We will forward all results to them. The patient verbalized understanding.

## 2022-01-11 ENCOUNTER — Other Ambulatory Visit: Payer: Self-pay | Admitting: Family Medicine

## 2022-01-11 ENCOUNTER — Ambulatory Visit (INDEPENDENT_AMBULATORY_CARE_PROVIDER_SITE_OTHER): Payer: Medicare Other | Admitting: Family Medicine

## 2022-01-11 ENCOUNTER — Encounter: Payer: Self-pay | Admitting: Family Medicine

## 2022-01-11 VITALS — BP 120/70 | HR 67 | Temp 97.9°F | Ht 71.0 in | Wt 166.1 lb

## 2022-01-11 DIAGNOSIS — E7800 Pure hypercholesterolemia, unspecified: Secondary | ICD-10-CM

## 2022-01-11 DIAGNOSIS — Z23 Encounter for immunization: Secondary | ICD-10-CM | POA: Diagnosis not present

## 2022-01-11 DIAGNOSIS — E1165 Type 2 diabetes mellitus with hyperglycemia: Secondary | ICD-10-CM

## 2022-01-11 LAB — CBC
HCT: 38 % — ABNORMAL LOW (ref 39.0–52.0)
Hemoglobin: 12.9 g/dL — ABNORMAL LOW (ref 13.0–17.0)
MCHC: 33.9 g/dL (ref 30.0–36.0)
MCV: 91.7 fl (ref 78.0–100.0)
Platelets: 238 10*3/uL (ref 150.0–400.0)
RBC: 4.14 Mil/uL — ABNORMAL LOW (ref 4.22–5.81)
RDW: 13.5 % (ref 11.5–15.5)
WBC: 6.8 10*3/uL (ref 4.0–10.5)

## 2022-01-11 LAB — COMPREHENSIVE METABOLIC PANEL
ALT: 23 U/L (ref 0–53)
AST: 19 U/L (ref 0–37)
Albumin: 4.2 g/dL (ref 3.5–5.2)
Alkaline Phosphatase: 89 U/L (ref 39–117)
BUN: 18 mg/dL (ref 6–23)
CO2: 27 mEq/L (ref 19–32)
Calcium: 9.6 mg/dL (ref 8.4–10.5)
Chloride: 100 mEq/L (ref 96–112)
Creatinine, Ser: 0.97 mg/dL (ref 0.40–1.50)
GFR: 78.92 mL/min (ref >60.00)
Glucose, Bld: 140 mg/dL — ABNORMAL HIGH (ref 70–99)
Potassium: 5.1 mEq/L (ref 3.5–5.1)
Sodium: 137 mEq/L (ref 135–145)
Total Bilirubin: 1.2 mg/dL (ref 0.2–1.2)
Total Protein: 6.8 g/dL (ref 6.0–8.3)

## 2022-01-11 LAB — MICROALBUMIN / CREATININE URINE RATIO
Creatinine,U: 117.6 mg/dL
Microalb Creat Ratio: 1.1 mg/g (ref 0.0–30.0)
Microalb, Ur: 1.3 mg/dL (ref 0.0–1.9)

## 2022-01-11 LAB — LIPID PANEL
Cholesterol: 102 mg/dL (ref 0–200)
HDL: 52.3 mg/dL (ref >39.00)
LDL Cholesterol: 41 mg/dL (ref 0–99)
NonHDL: 50.02
Total CHOL/HDL Ratio: 2
Triglycerides: 45 mg/dL (ref 0.0–149.0)
VLDL: 9 mg/dL (ref 0.0–40.0)

## 2022-01-11 LAB — HEMOGLOBIN A1C: Hgb A1c MFr Bld: 8.2 % — ABNORMAL HIGH (ref 4.6–6.5)

## 2022-01-11 MED ORDER — PIOGLITAZONE HCL 30 MG PO TABS: 30.0000 mg | ORAL_TABLET | Freq: Every day | ORAL | 3 refills | Status: DC

## 2022-01-11 MED ORDER — ATORVASTATIN CALCIUM 80 MG PO TABS: 80.0000 mg | ORAL_TABLET | Freq: Every morning | ORAL | 3 refills | Status: DC

## 2022-01-11 NOTE — Progress Notes (Signed)
Subjective:  ? ?Chief Complaint  ?Patient presents with  ? Follow-up  ?  6 month ?BS running high--average between 160 and 180  ? ? ?Alfred Clay is a 71 y.o. male here for follow-up of diabetes.  Here w wife.  ?Tremar's self monitored glucose range is 160-180's.  ?Patient denies hypoglycemic reactions. ?He checks his glucose levels 1 time(s) per day. ?Patient does not require insulin.   ?Medications include: metformin 500 mg bid ?Diet is healthy.  ?Exercise: active in yard ?No Cp or SOB. ? ?Hyperlipidemia ?Patient presents for hyperlipidemia follow up. ?Currently being treated with Lipitor 80 mg/d and compliance with treatment thus far has been good. ?He denies myalgias. ?Diet/exercise as above.  ?The patient is known to have coexisting coronary artery disease. ? ?Past Medical History:  ?Diagnosis Date  ? Acquired esophageal ring   ? Asthma   ? uses inhaler if needed  ? Cancer [facility])   ? skin- upper back and lower back  ? Cataract   ? bilateral-removed  ? DM type 2 (diabetes mellitus, type 2) ([place])   ? GERD (gastroesophageal reflux disease)   ? on meds  ? History of esophagitis   ? Hyperlipidemia   ? on meds  ? Hypertension   ? on meds  ?  ? ?Related testing: ?Retinal exam: Done ?Pneumovax: Due for PCV20 ? ?Objective:  ?BP 120/70   Pulse 67   Temp 97.9 ?F (36.6 ?C) (Oral)   Ht '5\' 11"'$  (1.803 m)   Wt 166 lb 2 oz (75.4 kg)   SpO2 95%   BMI 23.17 kg/m?  ?General:  Well developed, well nourished, in no apparent distress ?Skin:  Warm, no pallor or diaphoresis ?Lungs:  CTAB, no access msc use ?Cardio:  RRR, no bruits, no LE edema ?Musculoskeletal:  Symmetrical muscle groups noted without atrophy or deformity ?Neuro:  Sensation intact to pinprick on feet with exception of 3rd digit forefoot ?Psych: Age appropriate judgment and insight ? ?Assessment:  ? ?Type 2 diabetes mellitus with hyperglycemia, without long-term current use of insulin (HCC) - Plan: CBC, Comprehensive metabolic panel, Lipid panel, Hemoglobin  A1c, Microalbumin / creatinine urine ratio ? ?Pure hypercholesterolemia ? ?Need for pneumococcal 20-valent conjugate vaccination - Plan: Pneumococcal conjugate vaccine 20-valent (Prevnar 20)  ? ?Plan:  ? ?Chronic, unsure if controlled. Cont Metformin 500 mg bid for now. May need to add back Actos 30 mg/d. Counseled on diet and exercise. ?Chronic, stable. Cont Lipitor 80 mg/d. Receives Repatha from cards.  ?PCV20 today.  ?F/u in 6 mo. ?The patient voiced understanding and agreement to the plan. ? ?Shelda Pal, DO ?01/11/22 ?10:46 AM ? ?

## 2022-01-11 NOTE — Patient Instructions (Addendum)
Give [place] 2-3 business days to get the results of your labs back.  ? ?Keep the diet clean and stay active. ? ?The Shingrix vaccine (for shingles) is a 2 shot series spaced 2-6 months apart. It can make people feel low energy, achy and almost like they have the flu for 48 hours after injection. 1/5 people can have nausea and/or vomiting. Please plan accordingly when deciding on when to get this shot. Call your pharmacy to get this. The second shot of the series is less severe regarding the side effects, but it still lasts 48 hours.  ? ?Let us know if you need anything. ?

## 2022-02-08 ENCOUNTER — Encounter: Payer: Self-pay | Admitting: Gastroenterology

## 2022-02-08 NOTE — Telephone Encounter (Signed)
Spoke with pt and pt's wife and let them know we have an appt available Monday 5/22 at 10:00 am. Pt declined and stated he might as well wait for June 12 appt. Gave pt message about eating soft foods as tolerated and using boost and ensure to maintain caloric intake if needed. I only see one hospital day between now and 6/12 which is 6/6 and there are already 5 patients scheduled on that day.  ?

## 2022-02-08 NOTE — Telephone Encounter (Signed)
I can certainly get him in to see me in the clinic next week or the week after as an overbook appt for eval, but suspect he also needs repeat EGD with dilation to bridge him until his appt w/ [facility]. Can you please work on appt with me and seeing if we have any availability for EGD with dil at [facility] prior to 6/12? In the meantime, soft foods only as tolerated. Encourage to use Boost, Ensure, etc to maintain caloric intake if needing to supplement.  ?

## 2022-02-08 NOTE — Telephone Encounter (Signed)
Understood. If sxs worsening and not able to keep up with PO intake, then may need to come to the ER for eval and possible admission with EGD with dilation if needed. Otherwise, will keep the 5/22 appt open for him if he wishes to keep that.  ?

## 2022-03-02 ENCOUNTER — Ambulatory Visit: Payer: Medicare Other | Admitting: Physician Assistant

## 2022-04-12 ENCOUNTER — Encounter: Payer: Self-pay | Admitting: Family Medicine

## 2022-04-12 ENCOUNTER — Ambulatory Visit (INDEPENDENT_AMBULATORY_CARE_PROVIDER_SITE_OTHER): Payer: Medicare Other | Admitting: Family Medicine

## 2022-04-12 VITALS — BP 110/60 | HR 56 | Temp 97.8°F | Ht 71.0 in | Wt 155.1 lb

## 2022-04-12 DIAGNOSIS — E1165 Type 2 diabetes mellitus with hyperglycemia: Secondary | ICD-10-CM

## 2022-04-12 DIAGNOSIS — D692 Other nonthrombocytopenic purpura: Secondary | ICD-10-CM | POA: Diagnosis not present

## 2022-04-12 DIAGNOSIS — D649 Anemia, unspecified: Secondary | ICD-10-CM

## 2022-04-12 LAB — IBC + FERRITIN
Ferritin: 152.1 ng/mL (ref 22.0–322.0)
Iron: 72 ug/dL (ref 42–165)
Saturation Ratios: 23.3 % (ref 20.0–50.0)
TIBC: 309.4 ug/dL (ref 250.0–450.0)
Transferrin: 221 mg/dL (ref 212.0–360.0)

## 2022-04-12 LAB — CBC
HCT: 36.8 % — ABNORMAL LOW (ref 39.0–52.0)
Hemoglobin: 12.4 g/dL — ABNORMAL LOW (ref 13.0–17.0)
MCHC: 33.6 g/dL (ref 30.0–36.0)
MCV: 92.5 fl (ref 78.0–100.0)
Platelets: 258 10*3/uL (ref 150.0–400.0)
RBC: 3.98 Mil/uL — ABNORMAL LOW (ref 4.22–5.81)
RDW: 14.7 % (ref 11.5–15.5)
WBC: 7.6 10*3/uL (ref 4.0–10.5)

## 2022-04-12 LAB — HEMOGLOBIN A1C: Hgb A1c MFr Bld: 7.4 % — ABNORMAL HIGH (ref 4.6–6.5)

## 2022-04-12 MED ORDER — PIOGLITAZONE HCL 30 MG PO TABS: 30.0000 mg | ORAL_TABLET | Freq: Every day | ORAL | 2 refills | Status: DC

## 2022-04-12 NOTE — Patient Instructions (Addendum)
Give [place] 2-3 business days to get the results of your labs back.  Our follow up will be based on your results.   Keep the diet clean and stay active.  I will double check with the radiology team about the chest X-ray. If you don't hear from me by tomorrow, send me a message for an update.   Let us know if you need anything.

## 2022-04-12 NOTE — Progress Notes (Signed)
Subjective:   Chief Complaint  Patient presents with   Follow-up    3 month Send Actos to his mail order.    Alfred Clay is a 71 y.o. male here for follow-up of diabetes.   Alfred Clay does not monitor his sugars.  Patient does not require insulin.   Medications include: Metformin 500 mg bid, Actos 30 mg/d Diet is OK, dealing w a stricture.   Exercise: yardwork No chest pain or shortness of breath.  Patient has a history of normocytic anemia.  He denies any blood in the stool or urine.  He does have various areas of bruising on his arms.  He is not having any dizziness or lightheadedness.  Past Medical History:  Diagnosis Date   Acquired esophageal ring    Asthma    uses inhaler if needed   Cancer ([place])    skin- upper back and lower back   Cataract    bilateral-removed   DM type 2 (diabetes mellitus, type 2) (HCC)    GERD (gastroesophageal reflux disease)    on meds   History of esophagitis    Hyperlipidemia    on meds   Hypertension    on meds     Related testing: Retinal exam: Done Pneumovax: done  Objective:  BP 110/60   Pulse (!) 56   Temp 97.8 F (36.6 C) (Oral)   Ht '5\' 11"'$  (1.803 m)   Wt 155 lb 2 oz (70.4 kg)   SpO2 99%   BMI 21.64 kg/m  General:  Well developed, well nourished, in no apparent distress Skin:  Warm, no pallor or diaphoresis, no jaundice or pallor Lungs:  CTAB, no access msc use Cardio: Regular rhythm, bradycardic, no bruits, no LE edema Psych: Age appropriate judgment and insight  Assessment:   Type 2 diabetes mellitus with hyperglycemia, without long-term current use of insulin (HCC) - Plan: Hemoglobin A1c  Normocytic anemia - Plan: CBC, IBC + Ferritin  Senile purpura (HCC)   Plan:   Chronic, uncontrolled.  For now, continue metformin 500 mg twice daily and Actos 30 mg daily.  Goal A1c is less than 8.  Counseled on diet and exercise. Chronic, unsure if stable.  Recheck today.  Could be anemia of chronic disease. CXR from [facility]  showed bronchial thickening with recommendation to follow-up with a CT scan.  His cough has resolved since then.  After checking with the radiology team, agreed to hold off as he is a non-smoker and his cough as resolved. Message sent to family to update.   F/u in 3-6 mo. The patient voiced understanding and agreement to the plan.  [place], DO 04/12/22 10:40 AM

## 2022-05-02 ENCOUNTER — Other Ambulatory Visit: Payer: Self-pay | Admitting: Gastroenterology

## 2022-05-11 ENCOUNTER — Other Ambulatory Visit: Payer: Self-pay | Admitting: Family Medicine

## 2022-06-02 ENCOUNTER — Other Ambulatory Visit: Payer: Self-pay | Admitting: Cardiology

## 2022-06-29 ENCOUNTER — Ambulatory Visit: Payer: Medicare Other | Admitting: Physician Assistant

## 2022-07-13 ENCOUNTER — Ambulatory Visit: Payer: Medicare Other | Admitting: Family Medicine

## 2022-08-29 ENCOUNTER — Other Ambulatory Visit: Payer: Self-pay | Admitting: Cardiology

## 2022-09-08 ENCOUNTER — Ambulatory Visit
Admission: RE | Admit: 2022-09-08 | Discharge: 2022-09-08 | Disposition: A | Discharge status: Home / Self Care | Payer: Medicare Other | Source: Ambulatory Visit | Attending: Nurse Practitioner | Admitting: Nurse Practitioner

## 2022-09-08 ENCOUNTER — Ambulatory Visit (INDEPENDENT_AMBULATORY_CARE_PROVIDER_SITE_OTHER): Payer: Medicare Other

## 2022-09-08 VITALS — BP 115/73 | HR 76 | Temp 98.0°F | Resp 18

## 2022-09-08 DIAGNOSIS — R739 Hyperglycemia, unspecified: Secondary | ICD-10-CM

## 2022-09-08 DIAGNOSIS — R062 Wheezing: Secondary | ICD-10-CM | POA: Diagnosis not present

## 2022-09-08 DIAGNOSIS — R059 Cough, unspecified: Secondary | ICD-10-CM

## 2022-09-08 DIAGNOSIS — J189 Pneumonia, unspecified organism: Secondary | ICD-10-CM

## 2022-09-08 MED ORDER — DOXYCYCLINE HYCLATE 100 MG PO TABS: 100.0000 mg | ORAL_TABLET | Freq: Two times a day (BID) | ORAL | 0 refills | Status: AC

## 2022-09-08 MED ORDER — ALBUTEROL SULFATE HFA 108 (90 BASE) MCG/ACT IN AERS: 2.0000 | INHALATION_SPRAY | Freq: Four times a day (QID) | RESPIRATORY_TRACT | 0 refills | Status: DC | PRN

## 2022-09-08 MED ORDER — AMOXICILLIN-POT CLAVULANATE 875-125 MG PO TABS: 1.0000 | ORAL_TABLET | Freq: Two times a day (BID) | ORAL | 0 refills | Status: AC

## 2022-09-08 NOTE — Discharge Instructions (Signed)
The chest x-ray shows a right middle lobe pneumonia.  It also shows an area on the left chest that could not be visualized on x-ray.  Please follow-up with his primary care physician for further evaluation. Take medication as prescribed. Increase fluids and allow for plenty of rest. Continue use of Tylenol as needed for pain or discomfort. Recommend using a humidifier in the bedroom at nighttime and sleeping elevated while symptoms persist. Recommend following up with his primary care physician for reevaluation.  Suggest a chest x-ray over the next 3 to 4 weeks for reevaluation to its ensure pneumonia has resolved. If he develops worsening cough, shortness of breath, difficulty breathing, dizziness, or other concerns, please follow-up in the emergency department. Also recommend following up with his primary care physician for reevaluation of his elevated blood glucose levels. Follow-up as needed.

## 2022-09-08 NOTE — ED Triage Notes (Signed)
Pt reports he started with Flu like symptoms 1 week ago, blood sugar is high, 186 fasting this morning. Reports he still have cough and congestion, but is better that lats week; pt was dizzy yesterday when he was putting his pants.

## 2022-09-08 NOTE — ED Provider Notes (Signed)
[facility]    CSN: [id number] Arrival date & time: 09/08/22  0950      History   Chief Complaint Chief Complaint  Patient presents with   Blood Sugar Problem    Flu like symptoms. - Entered by patient   Appointment    1000    HPI Alfred Clay is a 71 y.o. male.   The history is provided by the patient and the spouse.   The patient presents with his spouse for a 1 week history of upper respiratory symptoms.  Patient continues to experience headache, nasal congestion, dizziness, and cough.  States that he had an episode of dizziness that lasted approximately 30 minutes 1 day ago, which has since resolved.  States patient did have a fever at the initial onset of his symptoms.  He denies shortness of breath, difficulty breathing, trouble breathing, ear pain, or GI symptoms.  Patient states that he has been taking Tylenol or ibuprofen for his symptoms.  Past Medical History:  Diagnosis Date   Acquired esophageal ring    Asthma    uses inhaler if needed   Cancer ([place])    skin- upper back and lower back   Cataract    bilateral-removed   DM type 2 (diabetes mellitus, type 2) ([place])    GERD (gastroesophageal reflux disease)    on meds   History of esophagitis    Hyperlipidemia    on meds   Hypertension    on meds    Patient Active Problem List   Diagnosis Date Noted   Senile purpura ([place]) 04/12/2022   Ischemic cardiomyopathy 08/04/2021   Pure hypercholesterolemia 08/04/2021   Moderate persistent asthma without complication 03/25/1600   Coronary artery disease involving native coronary artery of native heart with angina pectoris ([place]) 04/05/2021   Mild persistent asthma 04/05/2021   Hiatal hernia    Onychomycosis 10/16/2019   Hypertension    History of esophagitis    Type 2 diabetes mellitus with hyperglycemia, without long-term current use of insulin (HCC)    Esophageal dysphagia    Esophageal stenosis    Gastroesophageal reflux disease with  esophagitis without hemorrhage     Past Surgical History:  Procedure Laterality Date   BALLOON DILATION N/A 01/18/2021   Procedure: BALLOON DILATION;  Surgeon: Lavena Bullion, DO;  Location: [facility];  Service: Gastroenterology;  Laterality: N/A;   BALLOON DILATION N/A 02/18/2021   Procedure: BALLOON DILATION;  Surgeon: Lavena Bullion, DO;  Location: [facility];  Service: Gastroenterology;  Laterality: N/A;   BALLOON DILATION N/A 07/26/2021   Procedure: BALLOON DILATION;  Surgeon: Ladene Artist, MD;  Location: [facility];  Service: Endoscopy;  Laterality: N/A;   BIOPSY  08/06/2019   Procedure: BIOPSY;  Surgeon: Lavena Bullion, DO;  [facility]: [facility];  Service: Gastroenterology;;   BIOPSY  01/18/2021   Procedure: BIOPSY;  Surgeon: Lavena Bullion, DO;  [facility]: [facility];  Service: Gastroenterology;;   CATARACT EXTRACTION Bilateral 10/2018   COLONOSCOPY  2018   CORONARY STENT INTERVENTION N/A 11/05/2019   Procedure: CORONARY STENT INTERVENTION;  Surgeon: Burnell Blanks, MD;  Location: [place] [facility];  Service: Cardiovascular;  Laterality: N/A;   CORONARY/GRAFT ACUTE MI REVASCULARIZATION N/A 11/05/2019   Procedure: Coronary/Graft Acute MI Revascularization;  Surgeon: Burnell Blanks, MD;  Location: [place] [facility];  Service: Cardiovascular;  Laterality: N/A;   ESOPHAGEAL DILATION  12/02/2020   Procedure: ESOPHAGEAL DILATION;  Surgeon: Lavena Bullion, DO;  Location:  [facility];  Service: Gastroenterology;;   ESOPHAGEAL MANOMETRY N/A 10/20/2021   Procedure: ESOPHAGEAL MANOMETRY (EM);  Surgeon: Lavena Bullion, DO;  [facility];  Service: Gastroenterology;  Laterality: N/A;   ESOPHAGOGASTRODUODENOSCOPY  10/16/2018   ESOPHAGOGASTRODUODENOSCOPY (EGD) WITH PROPOFOL N/A 08/06/2019   Procedure: ESOPHAGOGASTRODUODENOSCOPY (EGD) WITH PROPOFOL;  Surgeon: Lavena Bullion, DO;  [facility];  Service: Gastroenterology;   Laterality: N/A;  with dilation using ped scope   ESOPHAGOGASTRODUODENOSCOPY (EGD) WITH PROPOFOL N/A 12/02/2020   Procedure: ESOPHAGOGASTRODUODENOSCOPY (EGD) WITH PROPOFOL;  Surgeon: Lavena Bullion, DO;  [facility];  Service: Gastroenterology;  Laterality: N/A;   ESOPHAGOGASTRODUODENOSCOPY (EGD) WITH PROPOFOL N/A 01/18/2021   Procedure: ESOPHAGOGASTRODUODENOSCOPY (EGD) WITH PROPOFOL;  Surgeon: Lavena Bullion, DO;  [facility];  Service: Gastroenterology;  Laterality: N/A;  with dilation   ESOPHAGOGASTRODUODENOSCOPY (EGD) WITH PROPOFOL N/A 02/18/2021   Procedure: ESOPHAGOGASTRODUODENOSCOPY (EGD) WITH PROPOFOL;  Surgeon: Lavena Bullion, DO;  [facility];  Service: Gastroenterology;  Laterality: N/A;   ESOPHAGOGASTRODUODENOSCOPY (EGD) WITH PROPOFOL N/A 07/26/2021   Procedure: ESOPHAGOGASTRODUODENOSCOPY (EGD) WITH PROPOFOL;  Surgeon: Ladene Artist, MD;  [facility];  Service: Endoscopy;  Laterality: N/A;  with dilation   HEMORRHOID SURGERY     KENALOG INJECTION  07/26/2021   Procedure: KENALOG INJECTION;  Surgeon: Ladene Artist, MD;  [facility]: [facility];  Service: Endoscopy;;   LEFT HEART CATH AND CORONARY ANGIOGRAPHY N/A 11/05/2019   Procedure: LEFT HEART CATH AND CORONARY ANGIOGRAPHY;  Surgeon: Burnell Blanks, MD;  Location: [place] [facility];  Service: Cardiovascular;  Laterality: N/A;   SAVORY DILATION N/A 08/06/2019   Procedure: SAVORY DILATION;  Surgeon: Lavena Bullion, DO;  [facility];  Service: Gastroenterology;  Laterality: N/A;   SCLEROTHERAPY  01/18/2021   Procedure: SCLEROTHERAPY;  Surgeon: Lavena Bullion, DO;  [facility];  Service: Gastroenterology;;   SKIN CANCER EXCISION     back for skin cancer   SUBMUCOSAL INJECTION  12/02/2020   Procedure: SUBMUCOSAL INJECTION;  Surgeon: Lavena Bullion, DO;  [facility];  Service: Gastroenterology;;   UPPER GASTROINTESTINAL ENDOSCOPY      WISDOM TOOTH EXTRACTION         Home Medications    Prior to Admission medications   Medication Sig Start Date End Date Taking? Authorizing Provider  albuterol (VENTOLIN HFA) 108 (90 Base) MCG/ACT inhaler Inhale 2 puffs into the lungs every 6 (six) hours as needed for wheezing or shortness of breath. 09/08/22  Yes Malcom Selmer-Warren, Alda Lea, NP  amoxicillin-clavulanate (AUGMENTIN) 875-125 MG tablet Take 1 tablet by mouth every 12 (twelve) hours for 5 days. 09/08/22 09/13/22 Yes Niv Darley-Warren, Alda Lea, NP  doxycycline (VIBRA-TABS) 100 MG tablet Take 1 tablet (100 mg total) by mouth 2 (two) times daily for 5 days. 09/08/22 09/13/22 Yes Esmee Fallaw-Warren, Alda Lea, NP  aspirin EC 81 MG tablet Take 1 tablet (81 mg total) by mouth daily. 11/08/19   Bhagat, Crista Luria, PA  atorvastatin (LIPITOR) 80 MG tablet Take 1 tablet (80 mg total) by mouth in the morning. 01/11/22   Shelda Pal, DO  Cobalamin Combinations (NEURIVA PLUS) CAPS Take 1 capsule by mouth daily.    [provider]  Evolocumab (REPATHA SURECLICK) 193 MG/ML SOAJ INJECT 140 MG INTO THE SKIN EVERY 14 DAYS 08/29/22   Jerline Pain, MD  fluticasone (FLOVENT HFA) 110 MCG/ACT inhaler Inhale 2 puffs into the lungs 2 (two) times daily.    [provider]  metFORMIN (GLUCOPHAGE) 500  MG tablet TAKE 1 TABLET (500 MG TOTAL) BY MOUTH 2 (TWO) TIMES DAILY WITH A MEAL. 05/12/22   Shelda Pal, DO  metoprolol succinate (TOPROL-XL) 25 MG 24 hr tablet TAKE 1/2 TABLET EVERY DAY 06/03/22   Jerline Pain, MD  nitroGLYCERIN (NITROSTAT) 0.4 MG SL tablet Place 1 tablet (0.4 mg total) under the tongue every 5 (five) minutes as needed. 11/08/19   Bhagat, Crista Luria, PA  pantoprazole (PROTONIX) 40 MG tablet TAKE 1 TABLET TWICE DAILY 05/02/22   Cirigliano, Vito V, DO  pioglitazone (ACTOS) 30 MG tablet Take 1 tablet (30 mg total) by mouth daily. 04/12/22   Shelda Pal, DO    Family History Family History  Problem  Relation Age of Onset   Diabetes Mother    Colon polyps Mother 34   Esophageal cancer Neg Hx    Rectal cancer Neg Hx    Stomach cancer Neg Hx    Colon cancer Neg Hx     Social History Social History   Tobacco Use   Smoking status: Never   Smokeless tobacco: Never  Vaping Use   Vaping Use: Never used  Substance Use Topics   Alcohol use: Yes    Alcohol/week: 1.0 standard drink of alcohol    Types: 1 Standard drinks or equivalent per week   Drug use: Not Currently     Allergies   Ace inhibitors   Review of Systems Review of Systems Per HPI  Physical Exam Triage Vital Signs ED Triage Vitals  Enc Vitals Group     BP 09/08/22 1004 115/73     Pulse Rate 09/08/22 1004 76     Resp 09/08/22 1004 18     Temp 09/08/22 1004 98 F (36.7 C)     Temp Source 09/08/22 1004 Oral     SpO2 09/08/22 1004 98 %     Weight --      Height --      Head Circumference --      Peak Flow --      Pain Score 09/08/22 1007 0     Pain Loc --      Pain Edu? --      Excl. in [place]? --    No data found.  Updated Vital Signs BP 115/73 (BP Location: Right Arm)   Pulse 76   Temp 98 F (36.7 C) (Oral)   Resp 18   SpO2 98%   Visual Acuity Right Eye Distance:   Left Eye Distance:   Bilateral Distance:    Right Eye Near:   Left Eye Near:    Bilateral Near:     Physical Exam Vitals and nursing note reviewed.  Constitutional:      General: He is not in acute distress.    Appearance: Normal appearance.  HENT:     Head: Normocephalic.     Right Ear: Tympanic membrane, ear canal and external ear normal.     Left Ear: Tympanic membrane, ear canal and external ear normal.     Nose: Congestion present.     Mouth/Throat:     Mouth: Mucous membranes are moist.     Pharynx: Posterior oropharyngeal erythema present.  Eyes:     Extraocular Movements: Extraocular movements intact.  Cardiovascular:     Rate and Rhythm: Normal rate and regular rhythm.     Pulses: Normal pulses.     Heart  sounds: Normal heart sounds.  Pulmonary:     Effort: Pulmonary effort is normal.  Breath sounds: Wheezing (expiratory wheezing in bilateral lower lobes) present.  Abdominal:     General: Bowel sounds are normal.     Palpations: Abdomen is soft.     Tenderness: There is no abdominal tenderness.  Musculoskeletal:     Cervical back: Normal range of motion.  Lymphadenopathy:     Cervical: No cervical adenopathy.  Skin:    General: Skin is warm and dry.  Neurological:     General: No focal deficit present.     Mental Status: He is alert and oriented to person, place, and time.  Psychiatric:        Mood and Affect: Mood normal.        Behavior: Behavior normal.      UC Treatments / Results  Labs (all labs ordered are listed, but only abnormal results are displayed) Labs Reviewed - No data to display  EKG   Radiology DG Chest 2 View  Result Date: 09/08/2022 CLINICAL DATA:  Cough and wheezing for the past 4 days. EXAM: CHEST - 2 VIEW COMPARISON:  Chest x-ray dated November 05, 2019. FINDINGS: The heart size and mediastinal contours are within normal limits. Normal pulmonary vascularity. New airspace disease in the right middle lobe. No pleural effusion or pneumothorax. Unchanged linear radiodensity projecting over the left hilum on the PA view, not clearly visualized on the lateral view. No acute osseous abnormality. IMPRESSION: 1. Right middle lobe pneumonia. 2. Unchanged linear radiodensity projecting over the left hilum on the PA view, not clearly visualized on the lateral view. This may be in the superficial anterior chest wall and obscured by bony structures. Consider non-contrast chest CT for definitive localization. Electronically Signed   By: Titus Dubin M.D.   On: 09/08/2022 10:52    Procedures Procedures (including critical care time)  Medications Ordered in UC Medications - No data to display  Initial Impression / Assessment and Plan / UC Course  I have reviewed  the triage vital signs and the nursing notes.  Pertinent labs & imaging results that were available during my care of the patient were reviewed by me and considered in my medical decision making (see chart for details).  The patient is well-appearing, he is in no acute distress, vital signs are stable.  Chest x-ray reveals a right middle lobe pneumonia.  Will treat with Augmentin 875/125 and doxycycline 100 mg.  Patient was also prescribed an albuterol inhaler for his wheezing.  Supportive care recommendations were provided to the patient and his spouse to include the use of Tylenol as needed for pain or discomfort, increasing fluids, and using a humidifier in the bedroom for any shortness of breath.  Patient and wife were provided strict ER precautions, along with recommendations to follow-up with the patient's provider to ensure resolution of his pneumonia.  Patient was also advised to follow-up with his primary care physician for further evaluation of his elevated blood glucose levels.  Patient's spouse verbalizes understanding.  All questions were answered.  Patient is stable for discharge. Final Clinical Impressions(s) / UC Diagnoses   Final diagnoses:  Pneumonia of right middle lobe due to infectious organism  Blood glucose elevated     Discharge Instructions      The chest x-ray shows a right middle lobe pneumonia.  It also shows an area on the left chest that could not be visualized on x-ray.  Please follow-up with his primary care physician for further evaluation. Take medication as prescribed. Increase fluids and allow for plenty of  rest. Continue use of Tylenol as needed for pain or discomfort. Recommend using a humidifier in the bedroom at nighttime and sleeping elevated while symptoms persist. Recommend following up with his primary care physician for reevaluation.  Suggest a chest x-ray over the next 3 to 4 weeks for reevaluation to its ensure pneumonia has resolved. If he  develops worsening cough, shortness of breath, difficulty breathing, dizziness, or other concerns, please follow-up in the emergency department. Also recommend following up with his primary care physician for reevaluation of his elevated blood glucose levels. Follow-up as needed.     ED Prescriptions     Medication Sig Dispense Auth. Provider   amoxicillin-clavulanate (AUGMENTIN) 875-125 MG tablet Take 1 tablet by mouth every 12 (twelve) hours for 5 days. 10 tablet Alexus Galka-Warren, Alda Lea, NP   doxycycline (VIBRA-TABS) 100 MG tablet Take 1 tablet (100 mg total) by mouth 2 (two) times daily for 5 days. 10 tablet Jackline Castilla-Warren, Alda Lea, NP   albuterol (VENTOLIN HFA) 108 (90 Base) MCG/ACT inhaler Inhale 2 puffs into the lungs every 6 (six) hours as needed for wheezing or shortness of breath. 8 g Mady Oubre-Warren, Alda Lea, NP      PDMP not reviewed this encounter.   Tish Men, NP 09/08/22 1121

## 2022-09-09 ENCOUNTER — Ambulatory Visit (INDEPENDENT_AMBULATORY_CARE_PROVIDER_SITE_OTHER): Payer: Medicare Other | Admitting: *Deleted

## 2022-09-09 DIAGNOSIS — Z0000 Encounter for general adult medical examination without abnormal findings: Secondary | ICD-10-CM | POA: Diagnosis not present

## 2022-09-09 NOTE — Progress Notes (Signed)
Subjective:   Alfred Clay is a 71 y.o. male who presents for Medicare Annual/Subsequent preventive examination.  I connected with  [name] on 09/09/22 by a audio enabled telemedicine application and verified that I am speaking with the correct person using two identifiers.  Patient Location: Home  Provider Location: Office/Clinic  I discussed the limitations of evaluation and management by telemedicine. The patient expressed understanding and agreed to proceed.   Review of Systems    Defer to PCP Cardiac Risk Factors include: advanced age (>46mn, >>30women);male gender;diabetes mellitus;dyslipidemia;hypertension     Objective:    There were no vitals filed for this visit. There is no height or weight on file to calculate BMI.     09/09/2022    2:03 PM 09/07/2021    1:58 PM 07/26/2021    7:13 AM 02/18/2021    7:04 AM 01/18/2021    6:21 AM 12/02/2020   10:31 AM 12/19/2019   11:49 AM  Advanced Directives  Does Patient Have a Medical Advance Directive? _0  Yes Yes  Type of AVisual merchandiserof AVal VerdeLiving will  Does patient want to make changes to medical advance directive?  No - Patient declined     No - Patient declined  Copy of HPorter[place]in Chart?      No - copy requested No - copy requested  Would patient like information on creating a medical advance directive? No - Patient declined  No - Patient declined No - Patient declined No - Patient declined      Current Medications (verified) Outpatient Encounter Medications as of 09/09/2022  Medication Sig   BUDESONIDE PO Take by mouth.   albuterol (VENTOLIN HFA) 108 (90 Base) MCG/ACT inhaler Inhale 2 puffs into the lungs every 6 (six) hours as needed for wheezing or shortness of breath.   amoxicillin-clavulanate (AUGMENTIN) 875-125 MG tablet Take 1 tablet by mouth every 12 (twelve) hours for 5 days.   aspirin EC 81 MG tablet Take  1 tablet (81 mg total) by mouth daily.   atorvastatin (LIPITOR) 80 MG tablet Take 1 tablet (80 mg total) by mouth in the morning.   Cobalamin Combinations (NEURIVA PLUS) CAPS Take 1 capsule by mouth daily.   doxycycline (VIBRA-TABS) 100 MG tablet Take 1 tablet (100 mg total) by mouth 2 (two) times daily for 5 days.   Evolocumab (REPATHA SURECLICK) 1741MG/ML SOAJ INJECT 140 MG INTO THE SKIN EVERY 14 DAYS   fluticasone (FLOVENT HFA) 110 MCG/ACT inhaler Inhale 2 puffs into the lungs 2 (two) times daily.   metFORMIN (GLUCOPHAGE) 500 MG tablet TAKE 1 TABLET (500 MG TOTAL) BY MOUTH 2 (TWO) TIMES DAILY WITH A MEAL.   metoprolol succinate (TOPROL-XL) 25 MG 24 hr tablet TAKE 1/2 TABLET EVERY DAY   nitroGLYCERIN (NITROSTAT) 0.4 MG SL tablet Place 1 tablet (0.4 mg total) under the tongue every 5 (five) minutes as needed.   pantoprazole (PROTONIX) 40 MG tablet TAKE 1 TABLET TWICE DAILY   pioglitazone (ACTOS) 30 MG tablet Take 1 tablet (30 mg total) by mouth daily.   No facility-administered encounter medications on file as of 09/09/2022.    Allergies (verified) Ace inhibitors   History: Past Medical History:  Diagnosis Date   Acquired esophageal ring    Asthma    uses inhaler if needed   [place]    skin- upper back and lower back  Cataract    bilateral-removed   DM type 2 (diabetes mellitus, type 2) ([place])    GERD (gastroesophageal reflux disease)    on meds   History of esophagitis    Hyperlipidemia    on meds   Hypertension    on meds   Past Surgical History:  Procedure Laterality Date   BALLOON DILATION N/A 01/18/2021   Procedure: BALLOON DILATION;  Surgeon: Lavena Bullion, DO;  Location: [facility];  Service: Gastroenterology;  Laterality: N/A;   BALLOON DILATION N/A 02/18/2021   Procedure: BALLOON DILATION;  Surgeon: Lavena Bullion, DO;  [facility]: [facility];  Service: Gastroenterology;  Laterality: N/A;   BALLOON DILATION N/A 07/26/2021   Procedure: BALLOON  DILATION;  Surgeon: Ladene Artist, MD;  Location: [facility];  Service: Endoscopy;  Laterality: N/A;   BIOPSY  08/06/2019   Procedure: BIOPSY;  Surgeon: Lavena Bullion, DO;  [facility];  Service: Gastroenterology;;   BIOPSY  01/18/2021   Procedure: BIOPSY;  Surgeon: Lavena Bullion, DO;  [facility];  Service: Gastroenterology;;   CATARACT EXTRACTION Bilateral 10/2018   COLONOSCOPY  2018   CORONARY STENT INTERVENTION N/A 11/05/2019   Procedure: CORONARY STENT INTERVENTION;  Surgeon: Burnell Blanks, MD;  Location: [place] [facility];  Service: Cardiovascular;  Laterality: N/A;   CORONARY/GRAFT ACUTE MI REVASCULARIZATION N/A 11/05/2019   Procedure: Coronary/Graft Acute MI Revascularization;  Surgeon: Burnell Blanks, MD;  Location: [place] [facility];  Service: Cardiovascular;  Laterality: N/A;   ESOPHAGEAL DILATION  12/02/2020   Procedure: ESOPHAGEAL DILATION;  Surgeon: Lavena Bullion, DO;  [facility];  Service: Gastroenterology;;   ESOPHAGEAL MANOMETRY N/A 10/20/2021   Procedure: ESOPHAGEAL MANOMETRY (EM);  Surgeon: Lavena Bullion, DO;  [facility];  Service: Gastroenterology;  Laterality: N/A;   ESOPHAGOGASTRODUODENOSCOPY  10/16/2018   ESOPHAGOGASTRODUODENOSCOPY (EGD) WITH PROPOFOL N/A 08/06/2019   Procedure: ESOPHAGOGASTRODUODENOSCOPY (EGD) WITH PROPOFOL;  Surgeon: Lavena Bullion, DO;  [facility];  Service: Gastroenterology;  Laterality: N/A;  with dilation using ped scope   ESOPHAGOGASTRODUODENOSCOPY (EGD) WITH PROPOFOL N/A 12/02/2020   Procedure: ESOPHAGOGASTRODUODENOSCOPY (EGD) WITH PROPOFOL;  Surgeon: Lavena Bullion, DO;  [facility];  Service: Gastroenterology;  Laterality: N/A;   ESOPHAGOGASTRODUODENOSCOPY (EGD) WITH PROPOFOL N/A 01/18/2021   Procedure: ESOPHAGOGASTRODUODENOSCOPY (EGD) WITH PROPOFOL;  Surgeon: Lavena Bullion, DO;  [facility];  Service: Gastroenterology;   Laterality: N/A;  with dilation   ESOPHAGOGASTRODUODENOSCOPY (EGD) WITH PROPOFOL N/A 02/18/2021   Procedure: ESOPHAGOGASTRODUODENOSCOPY (EGD) WITH PROPOFOL;  Surgeon: Lavena Bullion, DO;  [facility];  Service: Gastroenterology;  Laterality: N/A;   ESOPHAGOGASTRODUODENOSCOPY (EGD) WITH PROPOFOL N/A 07/26/2021   Procedure: ESOPHAGOGASTRODUODENOSCOPY (EGD) WITH PROPOFOL;  Surgeon: Ladene Artist, MD;  [facility];  Service: Endoscopy;  Laterality: N/A;  with dilation   HEMORRHOID SURGERY     KENALOG INJECTION  07/26/2021   Procedure: KENALOG INJECTION;  Surgeon: Ladene Artist, MD;  [facility];  Service: Endoscopy;;   LEFT HEART CATH AND CORONARY ANGIOGRAPHY N/A 11/05/2019   Procedure: LEFT HEART CATH AND CORONARY ANGIOGRAPHY;  Surgeon: Burnell Blanks, MD;  Location: [place] [facility];  Service: Cardiovascular;  Laterality: N/A;   SAVORY DILATION N/A 08/06/2019   Procedure: SAVORY DILATION;  Surgeon: Lavena Bullion, DO;  [facility];  Service: Gastroenterology;  Laterality: N/A;   SCLEROTHERAPY  01/18/2021   Procedure: SCLEROTHERAPY;  Surgeon: Lavena Bullion, DO;  [facility];  Service: Gastroenterology;;  SKIN CANCER EXCISION     back for skin cancer   SUBMUCOSAL INJECTION  12/02/2020   Procedure: SUBMUCOSAL INJECTION;  Surgeon: Lavena Bullion, DO;  [facility];  Service: Gastroenterology;;   UPPER GASTROINTESTINAL ENDOSCOPY     WISDOM TOOTH EXTRACTION     Family History  Problem Relation Age of Onset   Diabetes Mother    Colon polyps Mother 96   Esophageal cancer Neg Hx    Rectal cancer Neg Hx    Stomach cancer Neg Hx    Colon cancer Neg Hx    Social History   Socioeconomic History   Marital status: Married    Spouse name: Not on file   Number of children: Not on file   Years of education: Not on file   Highest education level: Not on file  Occupational History   Not on file  Tobacco  Use   Smoking status: Never   Smokeless tobacco: Never  Vaping Use   Vaping Use: Never used  Substance and Sexual Activity   Alcohol use: Yes    Alcohol/week: 1.0 standard drink of alcohol    Types: 1 Standard drinks or equivalent per week   Drug use: Not Currently   Sexual activity: Not on file  Other Topics Concern   Not on file  Social History Narrative   Not on file   Social Determinants of Health   Financial Resource Strain: Low Risk  (09/07/2021)   Overall Financial Resource Strain (CARDIA)    Difficulty of Paying Living Expenses: Not hard at all  Food Insecurity: No Food Insecurity (09/09/2022)   Hunger Vital Sign    Worried About Running Out of Food in the Last Year: Never true    Ran Out of Food in the Last Year: Never true  Transportation Needs: No Transportation Needs (09/09/2022)   PRAPARE - Hydrologist (Medical): No    Lack of Transportation (Non-Medical): No  Physical Activity: Inactive (09/07/2021)   Exercise Vital Sign    Days of Exercise per Week: 0 days    Minutes of Exercise per Session: 0 min  Stress: No Stress Concern Present (09/07/2021)   [place]    Feeling of Stress : Not at all  Social Connections: Moderately Isolated (09/07/2021)   Social Connection and Isolation Panel [NHANES]    Frequency of Communication with Friends and Family: More than three times a week    Frequency of Social Gatherings with Friends and Family: More than three times a week    Attends Religious Services: Never    Marine scientist or Organizations: No    Attends Music therapist: Never    Marital Status: Married    Tobacco Counseling Counseling given: Not Answered   Clinical Intake:  Pre-visit preparation completed: Yes  Pain : No/denies pain  How often do you need to have someone help you when you read instructions, pamphlets, or other written  materials from your doctor or pharmacy?: 1 - Never  Diabetic? Nutrition Risk Assessment:  Has the patient had any N/V/D within the last 2 months?  No  Does the patient have any non-healing wounds?  No  Has the patient had any unintentional weight loss or weight gain?  No   Diabetes:  Is the patient diabetic?  Yes  If diabetic, was a CBG obtained today?  No  Did the patient bring in their glucometer from home?  No  How often do you monitor your CBG's? Never.   Financial Strains and Diabetes Management:  Are you having any financial strains with the device, your supplies or your medication? No .  Does the patient want to be seen by Chronic Care Management for management of their diabetes?  No  Would the patient like to be referred to a Nutritionist or for Diabetic Management?  No   Diabetic Exams:  Diabetic Eye Exam: Completed 01/07/22 Diabetic Foot Exam: Completed 01/11/22   Interpreter Needed?: No  Information entered by :: Beatris Ship, [place]   Activities of Daily Living    09/09/2022    2:01 PM  In your present state of health, do you have any difficulty performing the following activities:  Hearing? 0  Vision? 0  Difficulty concentrating or making decisions? 1  Comment concentration and memory  Walking or climbing stairs? 0  Dressing or bathing? 0  Doing errands, shopping? 0  Preparing Food and eating ? N  Using the Toilet? N  In the past six months, have you accidently leaked urine? N  Do you have problems with loss of bowel control? N  Managing your Medications? N  Managing your Finances? N  Housekeeping or managing your Housekeeping? N    Patient Care Team: Shelda Pal, DO as PCP - General (Family Medicine) Jerline Pain, MD as PCP - Cardiology (Cardiology)  Indicate any recent Medical Services you may have received from other than [facility] providers in the past year (date may be approximate).     Assessment:   This is a routine wellness  examination for Alfred Clay.  Hearing/Vision screen No results found.  Dietary issues and exercise activities discussed: Current Exercise Habits: The patient does not participate in regular exercise at present, Exercise limited by: None identified   Goals Addressed   None    Depression Screen    09/09/2022    1:58 PM 04/12/2022    9:11 AM 09/07/2021    1:59 PM 04/05/2021   10:17 AM 12/19/2019   11:48 AM  PHQ 2/9 Scores  PHQ - 2 Score 0 0 0 0 0  PHQ- 9 Score  0   1    Fall Risk    09/09/2022    1:58 PM 04/12/2022    9:10 AM 09/07/2021    1:59 PM 04/05/2021   10:17 AM  [place] in the past year? 0 0 0 0  Number falls in past yr: 0 0 0 0  Injury with Fall? 0 0 0 0  Risk for fall due to : No Fall Risks No Fall Risks  No Fall Risks  Follow up Falls evaluation completed Falls evaluation completed Falls prevention discussed Falls evaluation completed    [place]:  Any stairs in or around the home? Yes  If so, are there any without handrails? No  Home free of loose throw rugs in walkways, pet beds, electrical cords, etc? Yes  Adequate lighting in your home to reduce risk of falls? Yes   ASSISTIVE DEVICES UTILIZED TO PREVENT FALLS:  Life alert? No  Use of a cane, walker or w/c? No  Grab bars in the bathroom? Yes  Shower chair or bench in shower? Yes  Elevated toilet seat or a handicapped toilet? No   TIMED UP AND GO:  Was the test performed?  No, audio visit .    Cognitive Function:    09/09/2022    2:01  PM  MMSE - Mini Mental State Exam  Not completed: Unable to complete        Immunizations Immunization History  Administered Date(s) Administered   Fluad Quad(high Dose 65+) 10/16/2019, 10/06/2020, 07/13/2021   Influenza-Unspecified 08/22/2022   Moderna SARS-COV2 Booster Vaccination 10/01/2020   Moderna Sars-Covid-2 Vaccination 12/13/2019, 01/14/2020   PNEUMOCOCCAL CONJUGATE-20 01/11/2022    TDAP status: Due,  Education has been provided regarding the importance of this vaccine. Advised may receive this vaccine at local pharmacy or Health Dept. Aware to provide a copy of the vaccination record if obtained from local pharmacy or Health Dept. Verbalized acceptance and understanding.  Flu Vaccine status: Up to date  Pneumococcal vaccine status: Up to date  Covid-19 vaccine status: Information provided on how to obtain vaccines.   Qualifies for Shingles Vaccine? Yes   Zostavax completed No   Shingrix Completed?: No.    Education has been provided regarding the importance of this vaccine. Patient has been advised to call insurance company to determine out of pocket expense if they have not yet received this vaccine. Advised may also receive vaccine at local pharmacy or Health Dept. Verbalized acceptance and understanding.  Screening Tests Health Maintenance  Topic Date Due   DTaP/Tdap/Td (1 - Tdap) Never done   Zoster Vaccines- Shingrix (1 of 2) Never done   Medicare Annual Wellness (AWV)  09/07/2022   HEMOGLOBIN A1C  10/13/2022   OPHTHALMOLOGY EXAM  01/08/2023   Diabetic kidney evaluation - eGFR measurement  01/12/2023   Diabetic kidney evaluation - Urine ACR  01/12/2023   FOOT EXAM  01/12/2023   COLONOSCOPY (Pts 45-29yr Insurance coverage will need to be confirmed)  10/15/2026   Pneumonia Vaccine 71 Years old  Completed   INFLUENZA VACCINE  Completed   Hepatitis C Screening  Completed   HPV VACCINES  Aged Out   COVID-19 Vaccine  Discontinued    Health Maintenance  Health Maintenance Due  Topic Date Due   DTaP/Tdap/Td (1 - Tdap) Never done   Zoster Vaccines- Shingrix (1 of 2) Never done   Medicare Annual Wellness (AWV)  09/07/2022    Colorectal cancer screening: Type of screening: Colonoscopy. Completed 10/15/16. Repeat every 10 years  Lung Cancer Screening: (Low Dose CT Chest recommended if Age 71-80years, 30 pack-year currently smoking OR have quit w/in 15years.) does not qualify.    Additional Screening:  Hepatitis C Screening: does qualify; Completed 10/16/19  Vision Screening: Recommended annual ophthalmology exams for early detection of glaucoma and other disorders of the eye. Is the patient up to date with their annual eye exam?  Yes  Who is the provider or what is the name of the office in which the patient attends annual eye exams? [facility] is not established with a provider, would they like to be referred to a provider to establish care? No .   Dental Screening: Recommended annual dental exams for proper oral hygiene  Community Resource Referral / Chronic Care Management: CRR required this visit?  No   CCM required this visit?  No      Plan:     I have personally reviewed and noted the following in the patient's chart:   Medical and social history Use of alcohol, tobacco or illicit drugs  Current medications and supplements including opioid prescriptions. Patient is not currently taking opioid prescriptions. Functional ability and status Nutritional status Physical activity Advanced directives List of other physicians Hospitalizations, surgeries, and ER visits in previous 12 months Vitals  Screenings to include cognitive, depression, and falls Referrals and appointments  In addition, I have reviewed and discussed with patient certain preventive protocols, quality metrics, and best practice recommendations. A written personalized care plan for preventive services as well as general preventive health recommendations were provided to patient.   Due to this being a telephonic visit, the after visit summary with patients personalized plan was offered to patient via mail or my-chart. Patient would like to access on my-chart.   Beatris Ship, [place]   09/09/2022   Nurse Notes: None

## 2022-09-09 NOTE — Patient Instructions (Signed)
Alfred Clay , Thank you for taking time to come for your Medicare Wellness Visit. I appreciate your ongoing commitment to your health goals. Please review the following plan we discussed and let me know if I can assist you in the future.   These are the goals we discussed:  Goals      Patient Stated     Continue eating a low cholesterol diet        This is a list of the screening recommended for you and due dates:  Health Maintenance  Topic Date Due   DTaP/Tdap/Td vaccine (1 - Tdap) Never done   Zoster (Shingles) Vaccine (1 of 2) Never done   Hemoglobin A1C  10/13/2022   Eye exam for diabetics  01/08/2023   Yearly kidney function blood test for diabetes  01/12/2023   Yearly kidney health urinalysis for diabetes  01/12/2023   Complete foot exam   01/12/2023   Medicare Annual Wellness Visit  09/10/2023   Colon Cancer Screening  10/15/2026   Pneumonia Vaccine  Completed   Flu Shot  Completed   Hepatitis C Screening: USPSTF Recommendation to screen - Ages 18-79 yo.  Completed   HPV Vaccine  Aged Out   COVID-19 Vaccine  Discontinued     Next appointment: Follow up in one year for your annual wellness visit.   Preventive Care 13 Years and Older, Male Preventive care refers to lifestyle choices and visits with your health care provider that can promote health and wellness. What does preventive care include? A yearly physical exam. This is also called an annual well check. Dental exams once or twice a year. Routine eye exams. Ask your health care provider how often you should have your eyes checked. Personal lifestyle choices, including: Daily care of your teeth and gums. Regular physical activity. Eating a healthy diet. Avoiding tobacco and drug use. Limiting alcohol use. Practicing safe sex. Taking low doses of aspirin every day. Taking vitamin and mineral supplements as recommended by your health care provider. What happens during an annual well check? The services and  screenings done by your health care provider during your annual well check will depend on your age, overall health, lifestyle risk factors, and family history of disease. Counseling  Your health care provider may ask you questions about your: Alcohol use. Tobacco use. Drug use. Emotional well-being. Home and relationship well-being. Sexual activity. Eating habits. History of falls. Memory and ability to understand (cognition). Work and work Statistician. Screening  You may have the following tests or measurements: Height, weight, and BMI. Blood pressure. Lipid and cholesterol levels. These may be checked every 5 years, or more frequently if you are over 24 years old. Skin check. Lung cancer screening. You may have this screening every year starting at age 70 if you have a 30-pack-year history of smoking and currently smoke or have quit within the past 15 years. Fecal occult blood test (FOBT) of the stool. You may have this test every year starting at age 79. Flexible sigmoidoscopy or colonoscopy. You may have a sigmoidoscopy every 5 years or a colonoscopy every 10 years starting at age 21. Prostate cancer screening. Recommendations will vary depending on your family history and other risks. Hepatitis C blood test. Hepatitis B blood test. Sexually transmitted disease (STD) testing. Diabetes screening. This is done by checking your blood sugar (glucose) after you have not eaten for a while (fasting). You may have this done every 1-3 years. Abdominal aortic aneurysm (AAA) screening. You may  need this if you are a current or former smoker. Osteoporosis. You may be screened starting at age 41 if you are at high risk. Talk with your health care provider about your test results, treatment options, and if necessary, the need for more tests. Vaccines  Your health care provider may recommend certain vaccines, such as: Influenza vaccine. This is recommended every year. Tetanus, diphtheria, and  acellular pertussis (Tdap, Td) vaccine. You may need a Td booster every 10 years. Zoster vaccine. You may need this after age 28. Pneumococcal 13-valent conjugate (PCV13) vaccine. One dose is recommended after age 81. Pneumococcal polysaccharide (PPSV23) vaccine. One dose is recommended after age 67. Talk to your health care provider about which screenings and vaccines you need and how often you need them. This information is not intended to replace advice given to you by your health care provider. Make sure you discuss any questions you have with your health care provider. Document Released: 10/09/2015 Document Revised: 06/01/2016 Document Reviewed: 07/14/2015 Elsevier Interactive Patient Education  2017 [employer] Prevention in the Home Falls can cause injuries. They can happen to people of all ages. There are many things you can do to make your home safe and to help prevent falls. What can I do on the outside of my home? Regularly fix the edges of walkways and driveways and fix any cracks. Remove anything that might make you trip as you walk through a door, such as a raised step or threshold. Trim any bushes or trees on the path to your home. Use bright outdoor lighting. Clear any walking paths of anything that might make someone trip, such as rocks or tools. Regularly check to see if handrails are loose or broken. Make sure that both sides of any steps have handrails. Any raised decks and porches should have guardrails on the edges. Have any leaves, snow, or ice cleared regularly. Use sand or salt on walking paths during winter. Clean up any spills in your garage right away. This includes oil or grease spills. What can I do in the bathroom? Use night lights. Install grab bars by the toilet and in the tub and shower. Do not use towel bars as grab bars. Use non-skid mats or decals in the tub or shower. If you need to sit down in the shower, use a plastic, non-slip stool. Keep  the floor dry. Clean up any water that spills on the floor as soon as it happens. Remove soap buildup in the tub or shower regularly. Attach bath mats securely with double-sided non-slip rug tape. Do not have throw rugs and other things on the floor that can make you trip. What can I do in the bedroom? Use night lights. Make sure that you have a light by your bed that is easy to reach. Do not use any sheets or blankets that are too big for your bed. They should not hang down onto the floor. Have a firm chair that has side arms. You can use this for support while you get dressed. Do not have throw rugs and other things on the floor that can make you trip. What can I do in the kitchen? Clean up any spills right away. Avoid walking on wet floors. Keep items that you use a lot in easy-to-reach places. If you need to reach something above you, use a strong step stool that has a grab bar. Keep electrical cords out of the way. Do not use floor polish or wax that makes  floors slippery. If you must use wax, use non-skid floor wax. Do not have throw rugs and other things on the floor that can make you trip. What can I do with my stairs? Do not leave any items on the stairs. Make sure that there are handrails on both sides of the stairs and use them. Fix handrails that are broken or loose. Make sure that handrails are as long as the stairways. Check any carpeting to make sure that it is firmly attached to the stairs. Fix any carpet that is loose or worn. Avoid having throw rugs at the top or bottom of the stairs. If you do have throw rugs, attach them to the floor with carpet tape. Make sure that you have a light switch at the top of the stairs and the bottom of the stairs. If you do not have them, ask someone to add them for you. What else can I do to help prevent falls? Wear shoes that: Do not have high heels. Have rubber bottoms. Are comfortable and fit you well. Are closed at the toe. Do not  wear sandals. If you use a stepladder: Make sure that it is fully opened. Do not climb a closed stepladder. Make sure that both sides of the stepladder are locked into place. Ask someone to hold it for you, if possible. Clearly mark and make sure that you can see: Any grab bars or handrails. First and last steps. Where the edge of each step is. Use tools that help you move around (mobility aids) if they are needed. These include: Canes. Walkers. Scooters. Crutches. Turn on the lights when you go into a dark area. Replace any light bulbs as soon as they burn out. Set up your furniture so you have a clear path. Avoid moving your furniture around. If any of your floors are uneven, fix them. If there are any pets around you, be aware of where they are. Review your medicines with your doctor. Some medicines can make you feel dizzy. This can increase your chance of falling. Ask your doctor what other things that you can do to help prevent falls. This information is not intended to replace advice given to you by your health care provider. Make sure you discuss any questions you have with your health care provider. Document Released: 07/09/2009 Document Revised: 02/18/2016 Document Reviewed: 10/17/2014 Elsevier Interactive Patient Education  2017 [employer].

## 2022-09-30 ENCOUNTER — Other Ambulatory Visit: Payer: Self-pay | Admitting: Family Medicine

## 2022-09-30 ENCOUNTER — Encounter: Payer: Self-pay | Admitting: Family Medicine

## 2022-09-30 ENCOUNTER — Ambulatory Visit (INDEPENDENT_AMBULATORY_CARE_PROVIDER_SITE_OTHER): Payer: Medicare Other | Admitting: Family Medicine

## 2022-09-30 ENCOUNTER — Telehealth: Payer: Self-pay | Admitting: Family Medicine

## 2022-09-30 ENCOUNTER — Other Ambulatory Visit: Payer: Medicare Other

## 2022-09-30 VITALS — BP 111/81 | HR 62 | Temp 98.1°F | Ht 71.0 in | Wt 170.2 lb

## 2022-09-30 DIAGNOSIS — R9389 Abnormal findings on diagnostic imaging of other specified body structures: Secondary | ICD-10-CM | POA: Diagnosis not present

## 2022-09-30 DIAGNOSIS — E7800 Pure hypercholesterolemia, unspecified: Secondary | ICD-10-CM

## 2022-09-30 DIAGNOSIS — E1165 Type 2 diabetes mellitus with hyperglycemia: Secondary | ICD-10-CM

## 2022-09-30 LAB — COMPREHENSIVE METABOLIC PANEL
ALT: 22 U/L (ref 0–53)
AST: 16 U/L (ref 0–37)
Albumin: 4.1 g/dL (ref 3.5–5.2)
Alkaline Phosphatase: 85 U/L (ref 39–117)
BUN: 23 mg/dL (ref 6–23)
CO2: 28 mEq/L (ref 19–32)
Calcium: 9.8 mg/dL (ref 8.4–10.5)
Chloride: 98 mEq/L (ref 96–112)
Creatinine, Ser: 0.86 mg/dL (ref 0.40–1.50)
GFR: 87.1 mL/min (ref >60.00)
Glucose, Bld: 259 mg/dL — ABNORMAL HIGH (ref 70–99)
Potassium: 4.7 mEq/L (ref 3.5–5.1)
Sodium: 135 mEq/L (ref 135–145)
Total Bilirubin: 1 mg/dL (ref 0.2–1.2)
Total Protein: 7.1 g/dL (ref 6.0–8.3)

## 2022-09-30 LAB — HEMOGLOBIN A1C: Hgb A1c MFr Bld: 9.9 % — ABNORMAL HIGH (ref 4.6–6.5)

## 2022-09-30 LAB — LIPID PANEL
Cholesterol: 154 mg/dL (ref 0–200)
HDL: 59.7 mg/dL (ref >39.00)
LDL Cholesterol: 82 mg/dL (ref 0–99)
NonHDL: 94.06
Total CHOL/HDL Ratio: 3
Triglycerides: 59 mg/dL (ref 0.0–149.0)
VLDL: 11.8 mg/dL (ref 0.0–40.0)

## 2022-09-30 MED ORDER — DAPAGLIFLOZIN PROPANEDIOL 10 MG PO TABS: 10.0000 mg | ORAL_TABLET | Freq: Every day | ORAL | 3 refills | Status: DC

## 2022-09-30 NOTE — Telephone Encounter (Signed)
Patients spouse returning [place] call. Advised they will receive a call back on Monday. She said ok.

## 2022-09-30 NOTE — Progress Notes (Signed)
Subjective:   Chief Complaint  Patient presents with   Follow-up    Chest Xray follow-up for Pneumonia    Alfred Clay is a 72 y.o. male here for follow-up of diabetes.  He is here with his spouse. Fadi does not monitor his sugars routinely.  Patient does not require insulin.   Medications include: metformin 500 mg bid, Actos 30 mg/d Diet is fair.  Exercise: active around house.  Hyperlipidemia Patient presents for dyslipidemia follow up. Currently being treated with Lipitor 80 mg/d and compliance with treatment thus far has been good. He denies myalgias. Diet/exercise as above.  The patient is not known to have coexisting coronary artery disease.  CXR- abn CXR at [facility], rec'd f/u with CT chest.  He is not having any cough, unexplained weight loss, or night sweats.  Past Medical History:  Diagnosis Date   Acquired esophageal ring    Asthma    uses inhaler if needed   Cancer ([place])    skin- upper back and lower back   Cataract    bilateral-removed   DM type 2 (diabetes mellitus, type 2) (HCC)    GERD (gastroesophageal reflux disease)    on meds   History of esophagitis    Hyperlipidemia    on meds   Hypertension    on meds     Related testing: Retinal exam: Done Pneumovax: done  Objective:  BP 111/81 (BP Location: Left Arm, Patient Position: Sitting, Cuff Size: Normal)   Pulse 62   Temp 98.1 F (36.7 C) (Oral)   Ht '5\' 11"'$  (1.803 m)   Wt 170 lb 4 oz (77.2 kg)   SpO2 97%   BMI 23.75 kg/m  General:  Well developed, well nourished, in no apparent distress Lungs:  CTAB, no access msc use Cardio:  RRR, no bruits, no LE edema Psych: Age appropriate judgment and insight  Assessment:   Type 2 diabetes mellitus with hyperglycemia, without long-term current use of insulin (HCC) - Plan: Comprehensive metabolic panel, Lipid panel, Hemoglobin A1c  Pure hypercholesterolemia  Abnormal chest x-ray - Plan: CT Chest Wo Contrast   Plan:   Chronic, stable.   Continue metformin 500 mg twice daily, Actos 30 mg daily.  Counseled on diet and exercise. Chronic, stable.  Continue Lipitor 80 mg daily. We will follow-up on this with a chest CT without contrast as recommended by radiology. F/u in 6 mo. The patient and his spouse voiced understanding and agreement to the plan.  [place], DO 09/30/22 10:51 AM

## 2022-09-30 NOTE — Patient Instructions (Signed)
Give [place] 2-3 business days to get the results of your labs back.   Keep the diet clean and stay active.  We will be in touch regarding your chest scan.   When you get your 2nd shingles shot (1/27 or beyond), inquire with the pharmacy about a tetanus booster.  Let us know if you need anything.

## 2022-10-04 LAB — ANTI-ISLET CELL ANTIBODY: Islet Cell Ab: NEGATIVE

## 2022-10-04 NOTE — Telephone Encounter (Signed)
See result notes. 

## 2022-10-11 ENCOUNTER — Ambulatory Visit (HOSPITAL_BASED_OUTPATIENT_CLINIC_OR_DEPARTMENT_OTHER)
Admission: RE | Admit: 2022-10-11 | Discharge: 2022-10-11 | Disposition: A | Discharge status: Home / Self Care | Payer: Medicare Other | Source: Ambulatory Visit | Attending: Family Medicine | Admitting: Family Medicine

## 2022-10-11 DIAGNOSIS — R9389 Abnormal findings on diagnostic imaging of other specified body structures: Secondary | ICD-10-CM

## 2022-10-14 ENCOUNTER — Other Ambulatory Visit (HOSPITAL_COMMUNITY): Payer: Self-pay

## 2022-10-14 ENCOUNTER — Ambulatory Visit: Payer: Medicare Other | Admitting: Family Medicine

## 2022-11-04 ENCOUNTER — Ambulatory Visit (INDEPENDENT_AMBULATORY_CARE_PROVIDER_SITE_OTHER): Payer: Medicare Other | Admitting: Family Medicine

## 2022-11-04 ENCOUNTER — Encounter: Payer: Self-pay | Admitting: Family Medicine

## 2022-11-04 VITALS — BP 120/80 | HR 63 | Temp 97.4°F | Ht 71.0 in | Wt 157.5 lb

## 2022-11-04 DIAGNOSIS — E1165 Type 2 diabetes mellitus with hyperglycemia: Secondary | ICD-10-CM

## 2022-11-04 MED ORDER — GLYBURIDE 5 MG PO TABS: 5.0000 mg | ORAL_TABLET | Freq: Two times a day (BID) | ORAL | 1 refills | Status: DC

## 2022-11-04 NOTE — Progress Notes (Signed)
Chief Complaint  Patient presents with   Follow-up    1 month DM    Subjective: Patient is a 72 y.o. male here for follow-up diabetes.  He is here with his wife.  Patient was started on budesonide for esophageal stricture by his gastroenterologist.  Since that time, his sugars were in the 200/300 range.  He reports compliance with metformin 500 mg twice daily and Actos 30 mg daily.  Diet fair, he stays active around the house/yard.  Farxiga 10 mg daily was added.  It was very expensive but they did not fill it.  His sugars have come down to the mid to high 100s.  No lows.  Past Medical History:  Diagnosis Date   Acquired esophageal ring    Asthma    uses inhaler if needed   Cancer ([place])    skin- upper back and lower back   Cataract    bilateral-removed   DM type 2 (diabetes mellitus, type 2) (HCC)    GERD (gastroesophageal reflux disease)    on meds   History of esophagitis    Hyperlipidemia    on meds   Hypertension    on meds    Objective: BP 120/80 (BP Location: Right Arm, Patient Position: Sitting, Cuff Size: Normal)   Pulse 63   Temp (!) 97.4 F (36.3 C) (Oral)   Ht 5' 11"$  (1.803 m)   Wt 157 lb 8 oz (71.4 kg)   SpO2 99%   BMI 21.97 kg/m  General: Awake, appears stated age Heart: RRR, no LE edema Lungs: CTAB, no rales, wheezes or rhonchi. No accessory muscle use Psych: Age appropriate judgment and insight, normal affect and mood  Assessment and Plan: Type 2 diabetes mellitus with hyperglycemia, without long-term current use of insulin ([place]) - Plan: glyBURIDE (DIABETA) 5 MG tablet  Chronic, not controlled.  Stop [place] due to cost.  Continue metformin 500 mg twice daily, Actos 30 mg daily, add glyburide 5 mg twice daily.  He will be on budesonide at least through March 15.  He will let me know if he does end up stopping this and we will stop his sulfonylurea.  Counseled on diet and exercise.  Continue to monitor blood sugars at home.  Follow-up in 2 months. The  patient and his spouse voiced understanding and agreement to the plan.  [place], DO 11/04/22  10:42 AM

## 2022-11-04 NOTE — Patient Instructions (Signed)
Keep the diet clean and stay active.  Check your sugars around 3 times per week. Alternate checking in the morning before you eat, in the afternoon and before bed. Write them down and bring it to your next appointment.   If you stop the budesonide, please let me know.   We are done with the [place].   Let us know if you need anything.

## 2022-11-25 ENCOUNTER — Other Ambulatory Visit: Payer: Self-pay | Admitting: Cardiology

## 2022-11-25 ENCOUNTER — Other Ambulatory Visit: Payer: Self-pay | Admitting: Family Medicine

## 2022-12-09 LAB — HM COLONOSCOPY

## 2022-12-21 ENCOUNTER — Telehealth: Payer: Self-pay | Admitting: Family Medicine

## 2022-12-21 DIAGNOSIS — E1165 Type 2 diabetes mellitus with hyperglycemia: Secondary | ICD-10-CM

## 2022-12-21 MED ORDER — GLYBURIDE 5 MG PO TABS: 5.0000 mg | ORAL_TABLET | Freq: Two times a day (BID) | ORAL | 1 refills | Status: DC

## 2022-12-21 NOTE — Telephone Encounter (Signed)
Sent in and patient made aware. 

## 2022-12-21 NOTE — Telephone Encounter (Signed)
Medication: glyBURIDE (DIABETA) 5 MG tablet  Has the patient contacted their pharmacy? Yes.     [facility] [place], [address], [place] [address] Phone: [phone]  Fax: [phone]

## 2023-01-02 ENCOUNTER — Other Ambulatory Visit: Payer: Self-pay | Admitting: Gastroenterology

## 2023-01-13 LAB — HM DIABETES EYE EXAM

## 2023-01-19 ENCOUNTER — Encounter: Payer: Self-pay | Admitting: Student

## 2023-01-19 ENCOUNTER — Ambulatory Visit: Payer: Medicare Other | Attending: Student | Admitting: Student

## 2023-01-19 VITALS — BP 122/56 | HR 80 | Ht 71.0 in | Wt 171.8 lb

## 2023-01-19 DIAGNOSIS — I2510 Atherosclerotic heart disease of native coronary artery without angina pectoris: Secondary | ICD-10-CM | POA: Diagnosis not present

## 2023-01-19 DIAGNOSIS — Z8679 Personal history of other diseases of the circulatory system: Secondary | ICD-10-CM | POA: Insufficient documentation

## 2023-01-19 DIAGNOSIS — E785 Hyperlipidemia, unspecified: Secondary | ICD-10-CM | POA: Insufficient documentation

## 2023-01-19 DIAGNOSIS — I10 Essential (primary) hypertension: Secondary | ICD-10-CM | POA: Insufficient documentation

## 2023-01-19 MED ORDER — METOPROLOL SUCCINATE ER 25 MG PO TB24: 12.5000 mg | ORAL_TABLET | Freq: Every day | ORAL | 3 refills | Status: DC

## 2023-01-19 MED ORDER — NITROGLYCERIN 0.4 MG SL SUBL: 0.4000 mg | SUBLINGUAL_TABLET | SUBLINGUAL | 2 refills | Status: DC | PRN

## 2023-01-19 MED ORDER — REPATHA SURECLICK 140 MG/ML ~~LOC~~ SOAJ: SUBCUTANEOUS | 3 refills | Status: DC

## 2023-01-19 NOTE — Patient Instructions (Signed)
Medication Instructions:  Your physician recommends that you continue on your current medications as directed. Please refer to the Current Medication list given to you today.   Labwork: None today  Testing/Procedures: None today  Follow-Up: 1 year with Dr.Skains  Any Other Special Instructions Will Be Listed Below (If Applicable).  If you need a refill on your cardiac medications before your next appointment, please call your pharmacy.

## 2023-01-19 NOTE — Progress Notes (Signed)
Cardiology Office Note    Date:  01/19/2023  ID:  Thane, Age [birth year], MRN [id number] Cardiologist: Donato Schultz, MD    History of Present Illness:    Alfred Clay is a 72 y.o. 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  He was last examined by Dr. Anne Fu in 07/2021 and denied any recent anginal symptoms at that time. He was continued on ASA 81 mg daily, Atorvastatin 80 mg daily and Toprol-XL 12.5 mg daily with Brilinta being discontinued since he had been on DAPT for over 1 year. He was referred to the Lipid Clinic given his elevated LDL and was started on PCSK9 inhibitor therapy with Repatha later that month.  In talking with the patient and his wife today, he reports overall doing well since his last office visit. 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 No recent palpitations, orthopnea, PND or pitting edema. He continues to use Repatha and says this is affordable with his insurance.  Studies Reviewed:   EKG: EKG is ordered today and demonstrates normal sinus rhythm, heart 74 with no acute ST abnormalities.  LHC: 10/2019 Prox RCA to Mid RCA lesion is 10% stenosed. 1st Mrg lesion is 30% stenosed. Mid Cx to Dist Cx lesion is 20% stenosed. Prox LAD to Mid LAD lesion is 99% stenosed. Mid LAD lesion is 30% stenosed. Dist LAD lesion is 20% stenosed. A drug-eluting stent was successfully placed using a SYNERGY XD 3.0X32. Post intervention, there is a 0% residual stenosis. The left ventricular systolic function is normal. LV end diastolic pressure is normal. The left ventricular ejection  fraction is 35-45% by visual estimate. There is no mitral valve regurgitation.   1. NSTEMI secondary to sub-total occlusion of the mid LAD 2. The LAD is a large caliber vessel that courses to the apex. The mid vessel has a thrombotic sub-total (99%) occlusion with flow into the distal vessel.  3. The Circumflex has mild disease 4. The RCA is a large dominant vessel with mild mid disease 5. Successful PTCA/DES x 1 mid LAD 6. Moderate segmental LV systolic dysfunction with hypokinesis of the anterior wall, apex and inferoapical wall   Recommendation: Will admit to the ICU. Will continue Aggrastat for 2 hours post PCI. Continue ASA and Brilinta for one year. Continue high intensity statin. Will start low dose beta blocker. Echo in am.   Limited Echocardiogram: 11/2019 MPRESSIONS     1. Limited study to assess LV function; normal LV systolic and diastolic  function; mild LVH; trace AI.   2. Left ventricular ejection fraction, by estimation, is 60 to 65%. The  left ventricle has normal function. The left ventricle has no regional  wall motion abnormalities. There is mild left ventricular hypertrophy.  Left ventricular diastolic parameters  were normal.   3. Right ventricular systolic function is normal. The right ventricular  size is normal.   4. The mitral valve is normal in structure. Trivial mitral valve  regurgitation. No evidence of mitral stenosis.   5. The aortic valve was not assessed. Aortic valve regurgitation is  trivial.   6. The inferior vena  cava is normal in size with greater than 50%  respiratory variability, suggesting right atrial pressure of 3 mmHg.     Physical Exam:   VS:  BP (!) 122/56   Pulse 80   Ht  (1.803 m)   Wt 171 lb 12.8 oz (77.9 kg)   SpO2 98%   BMI 23.96 kg/m    Wt Readings from Last 3 Encounters:  01/19/23 171 lb 12.8 oz (77.9 kg)  11/04/22 157 lb 8 oz (71.4 kg)  09/30/22 170 lb 4 oz (77.2 kg)     GEN: Pleasant male appearing in no  acute distress NECK: No JVD; No carotid bruits CARDIAC: RRR, no murmurs, rubs, gallops RESPIRATORY:  Clear to auscultation without rales, wheezing or rhonchi  ABDOMEN: Appears non-distended. No obvious abdominal masses. EXTREMITIES: No clubbing or cyanosis. No pitting edema.  Distal pedal pulses are 2+ bilaterally.   Assessment and Plan:   1. CAD - He is s/p NSTEMI in 10/2019 with DES to LAD. He remains active at baseline and denies any recent anginal symptoms. Continue current medical therapy with ASA 81 mg daily, Atorvastatin 80 mg daily, Toprol-XL 12.5 mg daily and Repatha. Will provide an updated Rx for PRN SL NTG.   2. HFimpEF  - His EF was previously reduced at 30-35% in 10/2019 in the setting of his NSTEMI but had improved to 60-65% in 11/2019. He denies any recent respiratory issues and appears euvolemic on examination today. Remains on Toprol-XL 12.5 mg daily. No indications for diuretics at this time.  3. HTN - His blood pressure is well-controlled at 122/56 during today's visit. Continue current medical therapy with Toprol-XL 12.5 mg daily.  4. HLD - FLP in 09/2022 showed total cholesterol 154, triglycerides 59, HDL 59 LDL 82 (LDL previously 41 in 12/2021). He was taking frequent steroids in 09/2022 and feels like this impacted his Hgb A1c and cholesterol at that time. Will continue to follow. He remains on Atorvastatin 80 mg daily and Repatha.   Signed, Ellsworth Lennox, PA-C

## 2023-03-04 ENCOUNTER — Other Ambulatory Visit: Payer: Self-pay | Admitting: Family Medicine

## 2023-04-03 ENCOUNTER — Encounter: Payer: Self-pay | Admitting: Family Medicine

## 2023-04-03 ENCOUNTER — Ambulatory Visit (INDEPENDENT_AMBULATORY_CARE_PROVIDER_SITE_OTHER): Payer: Medicare Other | Admitting: Family Medicine

## 2023-04-03 VITALS — BP 118/68 | HR 48 | Temp 98.0°F | Ht 71.0 in | Wt 171.4 lb

## 2023-04-03 DIAGNOSIS — Z7984 Long term (current) use of oral hypoglycemic drugs: Secondary | ICD-10-CM

## 2023-04-03 DIAGNOSIS — E1165 Type 2 diabetes mellitus with hyperglycemia: Secondary | ICD-10-CM | POA: Diagnosis not present

## 2023-04-03 DIAGNOSIS — E119 Type 2 diabetes mellitus without complications: Secondary | ICD-10-CM | POA: Diagnosis not present

## 2023-04-03 LAB — MICROALBUMIN / CREATININE URINE RATIO
Creatinine,U: 192 mg/dL
Microalb Creat Ratio: 0.6 mg/g (ref 0.0–30.0)
Microalb, Ur: 1.1 mg/dL (ref 0.0–1.9)

## 2023-04-03 LAB — LIPID PANEL
Cholesterol: 125 mg/dL (ref 0–200)
HDL: 51.5 mg/dL (ref >39.00)
LDL Cholesterol: 63 mg/dL (ref 0–99)
NonHDL: 73.26
Total CHOL/HDL Ratio: 2
Triglycerides: 50 mg/dL (ref 0.0–149.0)
VLDL: 10 mg/dL (ref 0.0–40.0)

## 2023-04-03 LAB — COMPREHENSIVE METABOLIC PANEL
ALT: 24 U/L (ref 0–53)
AST: 28 U/L (ref 0–37)
Albumin: 4.1 g/dL (ref 3.5–5.2)
Alkaline Phosphatase: 88 U/L (ref 39–117)
BUN: 20 mg/dL (ref 6–23)
CO2: 26 mEq/L (ref 19–32)
Calcium: 9.9 mg/dL (ref 8.4–10.5)
Chloride: 101 mEq/L (ref 96–112)
Creatinine, Ser: 0.95 mg/dL (ref 0.40–1.50)
GFR: 80.23 mL/min (ref >60.00)
Glucose, Bld: 93 mg/dL (ref 70–99)
Potassium: 4.5 mEq/L (ref 3.5–5.1)
Sodium: 138 mEq/L (ref 135–145)
Total Bilirubin: 0.8 mg/dL (ref 0.2–1.2)
Total Protein: 7.3 g/dL (ref 6.0–8.3)

## 2023-04-03 LAB — HEMOGLOBIN A1C: Hgb A1c MFr Bld: 6.9 % — ABNORMAL HIGH (ref 4.6–6.5)

## 2023-04-03 NOTE — Progress Notes (Signed)
Subjective:   Chief Complaint  Patient presents with   Follow-up    6 month     Alfred Clay is a 72 y.o. male here for follow-up of diabetes.   Heather does not routinely check his sugars.  Patient does not require insulin.   Medications include: metformin 500 mg bid, Actos 30 mg/d, glyburide 5 mg bid Diet is OK.  Exercise: active in yard No CP or SOB.   Past Medical History:  Diagnosis Date   Acquired esophageal ring    Asthma    uses inhaler if needed   CAD (coronary artery disease)    a. s/p NSTEMI in 10/2019 with DES to LAD   Cancer (HCC)    skin- upper back and lower back   Cataract    bilateral-removed   DM type 2 (diabetes mellitus, type 2) (HCC)    GERD (gastroesophageal reflux disease)    on meds   History of esophagitis    Hyperlipidemia    on meds   Hypertension    on meds     Related testing: Retinal exam: Done Pneumovax: done  Objective:  BP 118/68 (BP Location: Right Arm, Patient Position: Sitting, Cuff Size: Normal)   Pulse (!) 48   Temp 98 F (36.7 C) (Oral)   Ht 5\' 11"  (1.803 m)   Wt 171 lb 6 oz (77.7 kg)   SpO2 99%   BMI 23.90 kg/m  General:  Well developed, well nourished, in no apparent distress Skin:  Yellowed/thickened nails on feet; otherwise, warm, no pallor or diaphoresis Head:  Normocephalic, atraumatic Eyes:  Pupils equal and round, sclera anicteric without injection  Lungs:  CTAB, no access msc use Cardio:  Reg rhythm, bradycardic, no bruits, no LE edema Musculoskeletal:  Symmetrical muscle groups noted without atrophy or deformity Neuro:  Sensation intact to pinprick on feet Psych: Age appropriate judgment and insight  Assessment:   Type 2 diabetes mellitus with hyperglycemia, without long-term current use of insulin (HCC) - Plan: Comprehensive metabolic panel, Lipid panel, Hemoglobin A1c, Microalbumin / creatinine urine ratio  Diabetes mellitus treated with oral medication (HCC)   Plan:   Chronic, unstable. Cont  Actos 30 mg/d (will increase if not controlled), metformin 500 mg bid, glyburide 5 mg bid. Counseled on diet and exercise. F/u in 3-6 mo. The patient voiced understanding and agreement to the plan.  Jilda Roche [place], DO 04/03/23 10:07 AM

## 2023-04-03 NOTE — Patient Instructions (Signed)
Give us 2-3 business days to get the results of your labs back.   Keep the diet clean and stay active.  Let us know if you need anything. 

## 2023-05-18 ENCOUNTER — Other Ambulatory Visit: Payer: Self-pay | Admitting: Family Medicine

## 2023-05-18 DIAGNOSIS — E1165 Type 2 diabetes mellitus with hyperglycemia: Secondary | ICD-10-CM

## 2023-05-31 NOTE — Nursing Note (Signed)
 Patient is alert and oriented with wife  at bedside. Discharge instructions given to patient and reviewed with patient and family. They expressed understanding and agree. Patient ambulated with steady gait for discharge.

## 2023-05-31 NOTE — Nursing Note (Signed)
 Post procedure RN

## 2023-08-09 NOTE — H&P (Signed)
 PRE-PROCEDURE HISTORY AND PHYSICAL EXAM  Alfred Clay presents for his scheduled UGI ENDOSCOPY; WITH BIOPSY, SINGLE OR MULTIPLE.   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    There have been no significant recent changes in the patient's medical status.  Past Medical History:  Diagnosis Date  . CAD (coronary artery disease)   . Chronic systolic HF (heart failure) (CMS-HCC)   . Diabetes (CMS-HCC)   . GERD (gastroesophageal reflux disease)   . HTN (hypertension)   . Hyperlipidemia   . Ischemic cardiomyopathy   . STEMI (ST elevation myocardial infarction) (CMS-HCC) 10/2019   Past Surgical History:  Procedure Laterality Date  . CATARACT EXTRACTION Bilateral 10/2018  . CORONARY STENT PLACEMENT  11/05/2019  . HEMORROIDECTOMY    . PR COLONOSCOPY FLX DX W/COLLJ SPEC WHEN PFRMD N/A 12/09/2022   Procedure: COLONOSCOPY, FLEXIBLE, PROXIMAL TO SPLENIC FLEXURE; DIAGNOSTIC, W/WO COLLECTION SPECIMEN BY BRUSH OR WASH;  Surgeon: Cloria Lorrene Clarks, MD;  [facility];  Service: Gastroenterology  . PR UP GI ENDOSCOPY,BALL DIL,30MM N/A 05/04/2022   Procedure: UGI ENDO; W/BALLOON DILAT ESOPHAGUS (<30MM DIAM);  Surgeon: Lorrene Clarks Cloria, MD;  [facility];  Service: Gastroenterology  . PR UP GI ENDOSCOPY,BALL DIL,30MM N/A 12/09/2022   Procedure: UGI ENDO; W/BALLOON DILAT ESOPHAGUS (<30MM DIAM);  Surgeon: Cloria Lorrene Clarks, MD;  Location: [facility];  Service: Gastroenterology  . PR UP GI ENDOSCOPY,DILATN W GUIDE N/A 05/04/2022   Procedure: UGI ENDOSCOPY; WITH INSERTION OF GUIDE WIRE FOLLOWED BY DILATION OF ESOPHAGUS OVER GUIDE WIRE;  Surgeon: Lorrene Clarks Cloria, MD;  Location: [facility];  Service: Gastroenterology  . PR UP GI ENDOSCOPY,DILATN W GUIDE N/A 06/10/2022   Procedure: UGI ENDOSCOPY; WITH INSERTION OF GUIDE WIRE FOLLOWED BY DILATION OF ESOPHAGUS OVER GUIDE WIRE;  Surgeon: Lorrene Clarks Cloria, MD;   Location: [facility];  Service: Gastroenterology  . PR UP GI ENDOSCOPY,DILATN W GUIDE Left 08/09/2022   Procedure: UGI ENDOSCOPY; WITH INSERTION OF GUIDE WIRE FOLLOWED BY DILATION OF ESOPHAGUS OVER GUIDE WIRE;  Surgeon: Cloria Lorrene Clarks, MD;  Location: [facility];  Service: Gastroenterology  . PR UP GI ENDOSCOPY,DILATN W GUIDE N/A 05/31/2023   Procedure: UGI ENDOSCOPY; WITH INSERTION OF GUIDE WIRE FOLLOWED BY DILATION OF ESOPHAGUS OVER GUIDE WIRE;  Surgeon: Voncile Gang, MD;  Location: [facility];  Service: Gastroenterology  . PR UP GI ENDOSCOPY,DILATN W GUIDE N/A 06/28/2023   Procedure: UGI ENDOSCOPY; WITH INSERTION OF GUIDE WIRE FOLLOWED BY DILATION OF ESOPHAGUS OVER GUIDE WIRE;  Surgeon: Voncile Gang, MD;  Location: [facility]ED[facility];  Service: Gastroenterology  . PR UPPER GI ENDOSCOPY,BIOPSY N/A 05/04/2022   Procedure: UGI ENDOSCOPY; WITH BIOPSY, SINGLE OR MULTIPLE;  Surgeon: Lorrene Clarks Cloria, MD;  [facility]: [facility];  Service: Gastroenterology  . PR UPPER GI ENDOSCOPY,BIOPSY N/A 06/10/2022   Procedure: UGI ENDOSCOPY; WITH BIOPSY, SINGLE OR MULTIPLE;  Surgeon: Lorrene Clarks Cloria, MD;  Location: [facility];  Service: Gastroenterology  . PR UPPER GI ENDOSCOPY,BIOPSY Left 08/09/2022   Procedure: UGI ENDOSCOPY; WITH BIOPSY, SINGLE OR MULTIPLE;  Surgeon: Cloria Lorrene Clarks, MD;  Location: [facility] PROCED[facility];  Service: Gastroenterology  . PR UPPER GI ENDOSCOPY,BIOPSY N/A 12/09/2022   Procedure: UGI ENDOSCOPY; WITH BIOPSY, SINGLE OR MULTIPLE;  Surgeon: Cloria Lorrene Clarks, MD;  Location: [facility];  Service: Gastroenterology  . PR UPPER GI ENDOSCOPY,BIOPSY N/A  05/31/2023   Procedure: UGI ENDOSCOPY; WITH BIOPSY, SINGLE OR MULTIPLE;  Surgeon: Voncile Gang, MD;  Location: [facility]EDURES [facility];  Service: Gastroenterology  . PR UPPER GI ENDOSCOPY,BIOPSY N/A 06/28/2023    Procedure: UGI ENDOSCOPY; WITH BIOPSY, SINGLE OR MULTIPLE;  Surgeon: Voncile Gang, MD;  Location: [facility] PROCEDURES [facility];  Service: Gastroenterology  . PR UPPER GI ENDOSCOPY,W/DIR SUBMUC INJ N/A 05/04/2022   Procedure: UGI ENDO, INCL ESOPH/STOMACH/& EITHER DUODENUM AND/OR JEJUNUM AS APPROP; WITH DIRECTED SUBMUCOSAL INJECTION;  Surgeon: Lorrene Georgi Rase, MD;  [facility]: [facility];  Service: Gastroenterology    Allergies Allergies  Allergen Reactions  . Ace Inhibitors Cough    Medications B6-folic-B12-coffee-phosphatid, aspirin , atorvastatin , budesonide, fluticasone  propionate, glyBURIDE , metFORMIN , metoPROLOL  succinate, metroNIDAZOLE, nitroglycerin , pantoprazole , and pioglitazone   Physical Examination Vitals:   08/09/23 1246  BP: 139/71  Pulse: 51  Resp: 12  Temp: 36.2 C (97.1 F)  SpO2: 100%   Body mass index is 23.29 kg/m.  Mental Status: AAOx3, thoughts organized   Lungs: Clear to auscultation, unlabored breathing   Heart: Regular rate and rhythm, normal S1 and S2, no murmur   Abdomen: Soft, non-tender, non-distended     ASSESSMENT AND PLAN Alfred Clay has been evaluated and deemed appropriate to undergo the planned UGI ENDOSCOPY; WITH BIOPSY, SINGLE OR MULTIPLE.  The patient, or his authorized representative, was provided an explanation of the nature and benefits of the procedure(s), the most frequent risks, and alternatives, if any.  I personally reviewed this information with the patient, or his authorized representative, and answered all questions.

## 2023-08-09 NOTE — OR Nursing (Signed)
 Savory 7,8,9,10

## 2023-08-09 NOTE — OR Nursing (Signed)
 All medications and monitoring per anesthesia.Bite block placed prior to anesthesia inductionPre-induction verification and pre induction time out and debrief performed.  See anesthesia record for detail.

## 2023-08-22 NOTE — Telephone Encounter (Signed)
 See message from pharmacy, syrup not available. Need dosage for pills

## 2023-09-12 ENCOUNTER — Ambulatory Visit (INDEPENDENT_AMBULATORY_CARE_PROVIDER_SITE_OTHER): Payer: Medicare Other

## 2023-09-12 VITALS — Ht 71.0 in | Wt 171.0 lb

## 2023-09-12 DIAGNOSIS — Z0000 Encounter for general adult medical examination without abnormal findings: Secondary | ICD-10-CM

## 2023-09-12 NOTE — Progress Notes (Signed)
Subjective:   Alfred Clay is a 72 y.o. male who presents for Medicare Annual/Subsequent preventive examination.  Visit Complete: Virtual I connected with  Alfred Clay on 09/12/23 by a audio enabled telemedicine application and verified that I am speaking with the correct person using two identifiers.  Patient Location: Home  Provider Location: Home Office  I discussed the limitations of evaluation and management by telemedicine. The patient expressed understanding and agreed to proceed.  Vital Signs: Because this visit was a virtual/telehealth visit, some criteria may be missing or patient reported. Any vitals not documented were not able to be obtained and vitals that have been documented are patient reported.  Patient Medicare AWV questionnaire was completed by the patient on 09/12/23; I have confirmed that all information answered by patient is correct and no changes since this date.  Cardiac Risk Factors include: advanced age (>11men, >89 women);diabetes mellitus;male gender;hypertension     Objective:    Today's Vitals   09/12/23 1354  Weight: 171 lb (77.6 kg)  Height: 5\' 11"  (1.803 m)   Body mass index is 23.85 kg/m.     09/12/2023    2:02 PM 09/09/2022    2:03 PM 09/07/2021    1:58 PM 07/26/2021    7:13 AM 02/18/2021    7:04 AM 01/18/2021    6:21 AM 12/02/2020   10:31 AM  Advanced Directives  Does Patient Have a Medical Advance Directive? Yes No No No No No Yes  Type of Estate agent of [place];Living will      Healthcare Power of Attorney  Does patient want to make changes to medical advance directive?   No - Patient declined      Copy of Healthcare Power of Attorney in Chart? No - copy requested      No - copy requested  Would patient like information on creating a medical advance directive?  No - Patient declined  No - Patient declined No - Patient declined No - Patient declined     Current Medications (verified) Outpatient  Encounter Medications as of 09/12/2023  Medication Sig   albuterol (VENTOLIN HFA) 108 (90 Base) MCG/ACT inhaler Inhale 2 puffs into the lungs every 6 (six) hours as needed for wheezing or shortness of breath.   aspirin EC 81 MG tablet Take 1 tablet (81 mg total) by mouth daily.   atorvastatin (LIPITOR) 80 MG tablet TAKE 1 TABLET EVERY MORNING   BUDESONIDE PO Take by mouth.   Cobalamin Combinations (NEURIVA PLUS) CAPS Take 1 capsule by mouth daily.   Evolocumab (REPATHA SURECLICK) 140 MG/ML SOAJ INJECT 140 MG INTO THE SKIN EVERY 14 DAYS   fluticasone (FLOVENT HFA) 110 MCG/ACT inhaler Inhale 2 puffs into the lungs 2 (two) times daily.   glyBURIDE (DIABETA) 5 MG tablet TAKE 1 TABLET TWICE DAILY WITH MEALS   metFORMIN (GLUCOPHAGE) 500 MG tablet TAKE 1 TABLET TWICE DAILY WITH MEALS   metoprolol succinate (TOPROL-XL) 25 MG 24 hr tablet Take 0.5 tablets (12.5 mg total) by mouth daily.   nitroGLYCERIN (NITROSTAT) 0.4 MG SL tablet Place 1 tablet (0.4 mg total) under the tongue every 5 (five) minutes as needed.   pantoprazole (PROTONIX) 40 MG tablet TAKE 1 TABLET TWICE DAILY   pioglitazone (ACTOS) 30 MG tablet TAKE 1 TABLET (30 MG TOTAL) BY MOUTH DAILY.   No facility-administered encounter medications on file as of 09/12/2023.    Allergies (verified) Ace inhibitors   History: Past Medical History:  Diagnosis Date  Acquired esophageal ring    Asthma    uses inhaler if needed   CAD (coronary artery disease)    a. s/p NSTEMI in 10/2019 with DES to LAD   Cancer (HCC)    skin- upper back and lower back   Cataract    bilateral-removed   DM type 2 (diabetes mellitus, type 2) (HCC)    GERD (gastroesophageal reflux disease)    on meds   History of esophagitis    Hyperlipidemia    on meds   Hypertension    on meds   Past Surgical History:  Procedure Laterality Date   BALLOON DILATION N/A 01/18/2021   Procedure: BALLOON DILATION;  Surgeon: Shellia Cleverly, DO;  [facility];   Service: Gastroenterology;  Laterality: N/A;   BALLOON DILATION N/A 02/18/2021   Procedure: BALLOON DILATION;  Surgeon: Shellia Cleverly, DO;  [facility];  Service: Gastroenterology;  Laterality: N/A;   BALLOON DILATION N/A 07/26/2021   Procedure: BALLOON DILATION;  Surgeon: Meryl Dare, MD;  [facility];  Service: Endoscopy;  Laterality: N/A;   BIOPSY  08/06/2019   Procedure: BIOPSY;  Surgeon: Shellia Cleverly, DO;  [facility];  Service: Gastroenterology;;   BIOPSY  01/18/2021   Procedure: BIOPSY;  Surgeon: Shellia Cleverly, DO;  [facility];  Service: Gastroenterology;;   CATARACT EXTRACTION Bilateral 10/2018   COLONOSCOPY  2018   CORONARY STENT INTERVENTION N/A 11/05/2019   Procedure: CORONARY STENT INTERVENTION;  Surgeon: Kathleene Hazel, MD;  [facility];  Service: Cardiovascular;  Laterality: N/A;   CORONARY/GRAFT ACUTE MI REVASCULARIZATION N/A 11/05/2019   Procedure: Coronary/Graft Acute MI Revascularization;  Surgeon: Kathleene Hazel, MD;  [facility];  Service: Cardiovascular;  Laterality: N/A;   ESOPHAGEAL DILATION  12/02/2020   Procedure: ESOPHAGEAL DILATION;  Surgeon: Shellia Cleverly, DO;  [facility];  Service: Gastroenterology;;   ESOPHAGEAL MANOMETRY N/A 10/20/2021   Procedure: ESOPHAGEAL MANOMETRY (EM);  Surgeon: Shellia Cleverly, DO;  [facility];  Service: Gastroenterology;  Laterality: N/A;   ESOPHAGOGASTRODUODENOSCOPY  10/16/2018   ESOPHAGOGASTRODUODENOSCOPY (EGD) WITH PROPOFOL N/A 08/06/2019   Procedure: ESOPHAGOGASTRODUODENOSCOPY (EGD) WITH PROPOFOL;  Surgeon: Shellia Cleverly, DO;  [facility];  Service: Gastroenterology;  Laterality: N/A;  with dilation using ped scope   ESOPHAGOGASTRODUODENOSCOPY (EGD) WITH PROPOFOL N/A 12/02/2020   Procedure: ESOPHAGOGASTRODUODENOSCOPY (EGD) WITH PROPOFOL;  Surgeon: Shellia Cleverly, DO;  [facility];  Service: Gastroenterology;  Laterality: N/A;   ESOPHAGOGASTRODUODENOSCOPY (EGD) WITH PROPOFOL N/A 01/18/2021   Procedure: ESOPHAGOGASTRODUODENOSCOPY (EGD) WITH PROPOFOL;  Surgeon: Shellia Cleverly, DO;  [facility];  Service: Gastroenterology;  Laterality: N/A;  with dilation   ESOPHAGOGASTRODUODENOSCOPY (EGD) WITH PROPOFOL N/A 02/18/2021   Procedure: ESOPHAGOGASTRODUODENOSCOPY (EGD) WITH PROPOFOL;  Surgeon: Shellia Cleverly, DO;  [facility];  Service: Gastroenterology;  Laterality: N/A;   ESOPHAGOGASTRODUODENOSCOPY (EGD) WITH PROPOFOL N/A 07/26/2021   Procedure: ESOPHAGOGASTRODUODENOSCOPY (EGD) WITH PROPOFOL;  Surgeon: Meryl Dare, MD;  [facility];  Service: Endoscopy;  Laterality: N/A;  with dilation   HEMORRHOID SURGERY     KENALOG INJECTION  07/26/2021   Procedure: KENALOG INJECTION;  Surgeon: Meryl Dare, MD;  [facility];  Service: Endoscopy;;   LEFT HEART CATH AND CORONARY ANGIOGRAPHY N/A 11/05/2019   Procedure: LEFT HEART CATH AND CORONARY ANGIOGRAPHY;  Surgeon: Kathleene Hazel, MD;  [facility];  Service: Cardiovascular;  Laterality: N/A;  SAVORY DILATION N/A 08/06/2019   Procedure: SAVORY DILATION;  Surgeon: Shellia Cleverly, DO;  [facility];  Service: Gastroenterology;  Laterality: N/A;   SCLEROTHERAPY  01/18/2021   Procedure: SCLEROTHERAPY;  Surgeon: Shellia Cleverly, DO;  [facility];  Service: Gastroenterology;;   SKIN CANCER EXCISION     back for skin cancer   SUBMUCOSAL INJECTION  12/02/2020   Procedure: SUBMUCOSAL INJECTION;  Surgeon: Shellia Cleverly, DO;  [facility];  Service: Gastroenterology;;   UPPER GASTROINTESTINAL ENDOSCOPY     WISDOM TOOTH EXTRACTION     Family History  Problem Relation Age of Onset   Diabetes Mother    Colon polyps Mother 90   Esophageal cancer Neg Hx    Rectal cancer Neg Hx    Stomach cancer Neg Hx    Colon  cancer Neg Hx    Social History   Socioeconomic History   Marital status: Married    Spouse name: Not on file   Number of children: Not on file   Years of education: Not on file   Highest education level: Some college, no degree  Occupational History   Not on file  Tobacco Use   Smoking status: Never   Smokeless tobacco: Never  Vaping Use   Vaping status: Never Used  Substance and Sexual Activity   Alcohol use: Yes    Alcohol/week: 1.0 standard drink of alcohol    Types: 1 Standard drinks or equivalent per week   Drug use: Not Currently   Sexual activity: Not on file  Other Topics Concern   Not on file  Social History Narrative   Not on file   Social Drivers of Health   Financial Resource Strain: Low Risk  (09/12/2023)   Overall Financial Resource Strain (CARDIA)    Difficulty of Paying Living Expenses: Not hard at all  Food Insecurity: No Food Insecurity (09/12/2023)   Hunger Vital Sign    Worried About Running Out of Food in the Last Year: Never true    Ran Out of Food in the Last Year: Never true  Transportation Needs: No Transportation Needs (09/12/2023)   PRAPARE - Administrator, Civil Service (Medical): No    Lack of Transportation (Non-Medical): No  Physical Activity: Inactive (09/12/2023)   Exercise Vital Sign    Days of Exercise per Week: 0 days    Minutes of Exercise per Session: 0 min  Stress: No Stress Concern Present (09/12/2023)   [employer] of Occupational Health - Occupational Stress Questionnaire    Feeling of Stress : Not at all  Social Connections: Socially Isolated (09/12/2023)   Social Connection and Isolation Panel [NHANES]    Frequency of Communication with Friends and Family: Never    Frequency of Social Gatherings with Friends and Family: Once a week    Attends Religious Services: Never    Database administrator or Organizations: No    Attends Engineer, structural: Never    Marital Status: Married     Tobacco Counseling Counseling given: Not Answered   Clinical Intake:  Pre-visit preparation completed: Yes  Pain : No/denies pain     BMI - recorded: 23.85 Nutritional Status: BMI of 19-24  Normal Nutritional Risks: None Diabetes: Yes CBG done?: No Did pt. bring in CBG monitor from home?: No  How often do you need to have someone help you when you read instructions, pamphlets, or other written materials from your doctor or pharmacy?: 1 - Never  Interpreter Needed?: No  Information entered by :: Theresa Mulligan LPN   Activities of Daily Living    09/12/2023    7:56 AM  In your present state of health, do you have any difficulty performing the following activities:  Hearing? 0  Vision? 0  Difficulty concentrating or making decisions? 1  Walking or climbing stairs? 0  Dressing or bathing? 0  Doing errands, shopping? 1  Preparing Food and eating ? N  Using the Toilet? N  In the past six months, have you accidently leaked urine? N  Do you have problems with loss of bowel control? N  Managing your Medications? N  Managing your Finances? N  Housekeeping or managing your Housekeeping? N    Patient Care Team: Sharlene Dory, DO as PCP - General (Family Medicine) Jake Bathe, MD as PCP - Cardiology (Cardiology)  Indicate any recent Medical Services you may have received from other than [facility] providers in the past year (date may be approximate).     Assessment:   This is a routine wellness examination for Alfred Clay.  Hearing/Vision screen Hearing Screening - Comments:: Denies hearing difficulties   Vision Screening - Comments:: Wears rx glasses - up to date with routine eye exams with  [facility]   Goals Addressed               This Visit's Progress     Patient Stated (pt-stated)        Continue eating a low cholesterol diet!       Depression Screen    09/12/2023    2:08 PM 04/03/2023    9:44 AM 09/09/2022    1:58 PM 04/12/2022     9:11 AM 09/07/2021    1:59 PM 04/05/2021   10:17 AM 12/19/2019   11:48 AM  PHQ 2/9 Scores  PHQ - 2 Score 0 0 0 0 0 0 0  PHQ- 9 Score  0  0   1    Fall Risk    09/12/2023    2:00 PM 09/12/2023    7:56 AM 04/03/2023    9:44 AM 11/04/2022    9:54 AM 09/09/2022    1:58 PM  Fall Risk   Falls in the past year? 1 1 0 0 0  Number falls in past yr: 0 0 0 0 0  Injury with Fall? 0 0 0 0 0  Risk for fall due to : No Fall Risks  No Fall Risks  No Fall Risks  Follow up Falls prevention discussed  Falls evaluation completed  Falls evaluation completed    MEDICARE RISK AT HOME: Medicare Risk at Home Any stairs in or around the home?: Yes If so, are there any without handrails?: Yes Home free of loose throw rugs in walkways, pet beds, electrical cords, etc?: No Adequate lighting in your home to reduce risk of falls?: Yes Life alert?: No Use of a cane, walker or w/c?: No Grab bars in the bathroom?: No Shower chair or bench in shower?: Yes Elevated toilet seat or a handicapped toilet?: No  TIMED UP AND GO:  Was the test performed?  No    Cognitive Function:    09/09/2022    2:01 PM  MMSE - Mini Mental State Exam  Not completed: Unable to complete        09/12/2023    2:03 PM  6CIT Screen  What Year? 0 points  What month? 0 points  What time? 0 points  Count back  from 20 0 points  Months in reverse 0 points  Repeat phrase 0 points  Total Score 0 points    Immunizations Immunization History  Administered Date(s) Administered   Fluad Quad(high Dose 65+) 10/16/2019, 10/06/2020, 07/13/2021   Influenza-Unspecified 08/22/2022   Moderna SARS-COV2 Booster Vaccination 10/01/2020   Moderna Sars-Covid-2 Vaccination 12/13/2019, 01/14/2020   PNEUMOCOCCAL CONJUGATE-20 01/11/2022   Tdap 04/03/2015   Zoster Recombinant(Shingrix) 08/22/2022    TDAP status: Up to date  Flu Vaccine status: Due, Education has been provided regarding the importance of this vaccine. Advised may receive  this vaccine at local pharmacy or Health Dept. Aware to provide a copy of the vaccination record if obtained from local pharmacy or Health Dept. Verbalized acceptance and understanding.  Pneumococcal vaccine status: Up to date    Qualifies for Shingles Vaccine? Yes   Zostavax completed No   Shingrix Completed?: No.    Education has been provided regarding the importance of this vaccine. Patient has been advised to call insurance company to determine out of pocket expense if they have not yet received this vaccine. Advised may also receive vaccine at local pharmacy or Health Dept. Verbalized acceptance and understanding.  Screening Tests Health Maintenance  Topic Date Due   INFLUENZA VACCINE  04/27/2023   Zoster Vaccines- Shingrix (2 of 2) 10/04/2023 (Originally 10/17/2022)   HEMOGLOBIN A1C  10/04/2023   OPHTHALMOLOGY EXAM  01/18/2024   Diabetic kidney evaluation - eGFR measurement  04/02/2024   Diabetic kidney evaluation - Urine ACR  04/02/2024   FOOT EXAM  04/02/2024   Medicare Annual Wellness (AWV)  09/11/2024   DTaP/Tdap/Td (2 - Td or Tdap) 04/02/2025   Colonoscopy  12/08/2032   Pneumonia Vaccine 61+ Years old  Completed   Hepatitis C Screening  Completed   HPV VACCINES  Aged Out   COVID-19 Vaccine  Discontinued    Health Maintenance  Health Maintenance Due  Topic Date Due   INFLUENZA VACCINE  04/27/2023    Colorectal cancer screening: Type of screening: Colonoscopy. Completed 12/09/22. Repeat every 10 years    Additional Screening:  Hepatitis C Screening: does qualify; Completed 10/16/19  Vision Screening: Recommended annual ophthalmology exams for early detection of glaucoma and other disorders of the eye. Is the patient up to date with their annual eye exam?  Yes  Who is the provider or what is the name of the office in which the patient attends annual eye exams? [facility] If pt is not established with a provider, would they like to be referred to a provider to  establish care? No .   Dental Screening: Recommended annual dental exams for proper oral hygiene  Diabetic Foot Exam: Diabetic Foot Exam: Completed 04/03/23  Community Resource Referral / Chronic Care Management:  CRR required this visit?  No   CCM required this visit?  No     Plan:     I have personally reviewed and noted the following in the patient's chart:   Medical and social history Use of alcohol, tobacco or illicit drugs  Current medications and supplements including opioid prescriptions. Patient is not currently taking opioid prescriptions. Functional ability and status Nutritional status Physical activity Advanced directives List of other physicians Hospitalizations, surgeries, and ER visits in previous 12 months Vitals Screenings to include cognitive, depression, and falls Referrals and appointments  In addition, I have reviewed and discussed with patient certain preventive protocols, quality metrics, and best practice recommendations. A written personalized care plan for preventive services as well as  general preventive health recommendations were provided to patient.     Tillie Rung, LPN   [id number]   After Visit Summary: (MyChart) Due to this being a telephonic visit, the after visit summary with patients personalized plan was offered to patient via MyChart   Nurse Notes: None

## 2023-09-12 NOTE — Patient Instructions (Addendum)
Mr. Alfred Clay , Thank you for taking time to come for your Medicare Wellness Visit. I appreciate your ongoing commitment to your health goals. Please review the following plan we discussed and let me know if I can assist you in the future.   Referrals/Orders/Follow-Ups/Clinician Recommendations:   This is a list of the screening recommended for you and due dates:  Health Maintenance  Topic Date Due   Flu Shot  04/27/2023   Zoster (Shingles) Vaccine (2 of 2) 10/04/2023*   Hemoglobin A1C  10/04/2023   Eye exam for diabetics  01/18/2024   Yearly kidney function blood test for diabetes  04/02/2024   Yearly kidney health urinalysis for diabetes  04/02/2024   Complete foot exam   04/02/2024   Medicare Annual Wellness Visit  09/11/2024   DTaP/Tdap/Td vaccine (2 - Td or Tdap) 04/02/2025   Colon Cancer Screening  12/08/2032   Pneumonia Vaccine  Completed   Hepatitis C Screening  Completed   HPV Vaccine  Aged Out   COVID-19 Vaccine  Discontinued  *Topic was postponed. The date shown is not the original due date.    Advanced directives: (Copy Requested) Please bring a copy of your health care power of attorney and living will to the office to be added to your chart at your convenience.  Next Medicare Annual Wellness Visit scheduled for next year: Yes

## 2023-09-16 ENCOUNTER — Other Ambulatory Visit: Payer: Self-pay | Admitting: Family Medicine

## 2023-10-02 NOTE — Progress Notes (Signed)
-------------------------------------------------------------------------------   Attestation signed by Demetria Arland Elbe, MD at 10/02/23 1410 I saw and evaluated the patient, participating in the key elements of the service.  I discussed the findings, assessment and plan with the Resident and agree with the Resident's findings and plan as documented in the Resident's note.  I was present for the entirety of procedures taking less than 5 minutes and was present for the key and critical portions and immediately available for the entirety of procedure(s) taking 5 or more minutes.  Arland DELENA Demetria, MD  -------------------------------------------------------------------------------  PROCEDURE NOTE: Excision  INDICATION: Treatment/removal of Basal cell carcinoma  Location: central back   Preoperative Pathology Report: Diagnosis  Date Value Ref Range Status  08/17/2023   Final   Central back, shave:  Basal cell carcinoma, nodular pattern, present at deep tissue edge.       Biopsy Photo:    Time out taken: Patient Verification with name, medical record number, and date of birth performed.  Site and procedure verified with patient/physician agreement and clinical photograph.  2 Health Care Workers involved in verification.  Consent form:. Implantable device: no            Allergies to Anesthetics: no Intolerance to Epinephrine: no      Anticoagulants: no  Major Medical Problems: reviewed  Time: 1pm    Personnel present during procedure/verification: Lauraine JAYSON Kanaris, MD Lauraine JAYSON Kanaris, MD   Operating surgeon (s): Lauraine JAYSON Kanaris, MD Lauraine JAYSON Kanaris, MD    The size and nature of the excision as well as typical scarring were discussed along with, among others, the risks of bleeding, infection, cutaneous dysesthesia, positive margins, and lesion recurrence. Written consent obtained/form signed.  EXCISION Anesthesia: lidocaine  1% with epinephrine was infiltrated at the site with a total of 9  ml injected.  The surgical site was marked, cleansed with chlorhexidine  x 2, and draped in a sterile fashion.    Measurements: Tumor diameter: 5  mm    Margins: 4 mm    Total excision (tumor + margins) size:  1.3 cm   Site was excised to the subcutaneous tissue  Specimen tagged: n/a  tip.  Undermining was performed, and hemostasis was accomplished with electrocautery and pressure.   CLOSURE Linear layered closure performed to close potential space with: Buried vertical mattress sutures:  3-0 PDSII  Superficial suture:  Simple running 4-0 Prolene  Length of closure:  3.5 cm   ANTIBIOTICS: None    DRESSING A dry pressure dressing was applied to the wound site over petrolatum.  Wound healing expectations and the specifics of wound cleansing and bandaging were discussed in detail. The patient was given a handout reiterating instructions.   Suture removal plan: 2 weeks at home, suture removal kit provided  RTC: No follow-ups on file.

## 2023-10-03 NOTE — Telephone Encounter (Signed)
 Call from wife of patient.  States needs wound instructions for [facility] surgery yesterday.  Can send via mychart.  Patient did not receive his instructions.

## 2023-10-04 ENCOUNTER — Ambulatory Visit: Payer: Medicare Other | Admitting: Family Medicine

## 2023-10-09 ENCOUNTER — Ambulatory Visit (INDEPENDENT_AMBULATORY_CARE_PROVIDER_SITE_OTHER): Payer: Medicare Other | Admitting: Family Medicine

## 2023-10-09 ENCOUNTER — Encounter: Payer: Self-pay | Admitting: Family Medicine

## 2023-10-09 VITALS — BP 122/70 | HR 83 | Temp 98.0°F | Resp 16 | Ht 71.0 in | Wt 165.6 lb

## 2023-10-09 DIAGNOSIS — Z7984 Long term (current) use of oral hypoglycemic drugs: Secondary | ICD-10-CM | POA: Diagnosis not present

## 2023-10-09 DIAGNOSIS — E7800 Pure hypercholesterolemia, unspecified: Secondary | ICD-10-CM

## 2023-10-09 DIAGNOSIS — Z23 Encounter for immunization: Secondary | ICD-10-CM | POA: Diagnosis not present

## 2023-10-09 DIAGNOSIS — E1165 Type 2 diabetes mellitus with hyperglycemia: Secondary | ICD-10-CM

## 2023-10-09 LAB — COMPREHENSIVE METABOLIC PANEL
ALT: 12 U/L (ref 0–53)
AST: 19 U/L (ref 0–37)
Albumin: 3.9 g/dL (ref 3.5–5.2)
Alkaline Phosphatase: 70 U/L (ref 39–117)
BUN: 20 mg/dL (ref 6–23)
CO2: 25 meq/L (ref 19–32)
Calcium: 9.3 mg/dL (ref 8.4–10.5)
Chloride: 104 meq/L (ref 96–112)
Creatinine, Ser: 0.81 mg/dL (ref 0.40–1.50)
GFR: 88.05 mL/min (ref >60.00)
Glucose, Bld: 145 mg/dL — ABNORMAL HIGH (ref 70–99)
Potassium: 4.2 meq/L (ref 3.5–5.1)
Sodium: 138 meq/L (ref 135–145)
Total Bilirubin: 0.6 mg/dL (ref 0.2–1.2)
Total Protein: 6.7 g/dL (ref 6.0–8.3)

## 2023-10-09 LAB — LIPID PANEL
Cholesterol: 149 mg/dL (ref 0–200)
HDL: 60.2 mg/dL (ref >39.00)
LDL Cholesterol: 79 mg/dL (ref 0–99)
NonHDL: 89.1
Total CHOL/HDL Ratio: 2
Triglycerides: 53 mg/dL (ref 0.0–149.0)
VLDL: 10.6 mg/dL (ref 0.0–40.0)

## 2023-10-09 LAB — HEMOGLOBIN A1C: Hgb A1c MFr Bld: 7.1 % — ABNORMAL HIGH (ref 4.6–6.5)

## 2023-10-09 NOTE — Addendum Note (Signed)
 Addended by: Kathi Ludwig on: 10/09/2023 12:57 PM   Modules accepted: Orders

## 2023-10-09 NOTE — Progress Notes (Signed)
 Subjective:   Chief Complaint  Patient presents with   Follow-up    Follow up DM    Alfred Clay is a 73 y.o. male here for follow-up of diabetes.   Alfred Clay does not routinely check his sugars.  Patient does not require insulin .   Medications include: Actos  30 mg/d, metformin  500 mg bid, glyburide  5 mg twice daily Diet is OK.  Exercise: some walking  Hyperlipidemia Patient presents for hyperlipidemia follow up. Currently being treated with Lipitor  80 mg/d and compliance with treatment thus far has been good. He denies myalgias. Diet/exercise as above. No CP or SOB.  The patient is known to have coexisting coronary artery disease.  Past Medical History:  Diagnosis Date   Acquired esophageal ring    Asthma    uses inhaler if needed   CAD (coronary artery disease)    a. s/p NSTEMI in 10/2019 with DES to LAD   Cancer (HCC)    skin- upper back and lower back   Cataract    bilateral-removed   DM type 2 (diabetes mellitus, type 2) (HCC)    GERD (gastroesophageal reflux disease)    on meds   History of esophagitis    Hyperlipidemia    on meds   Hypertension    on meds     Related testing: Retinal exam: Done Pneumovax: done  Objective:  BP 122/70   Pulse 83   Temp 98 F (36.7 C) (Oral)   Resp 16   Ht 5' 11 (1.803 m)   Wt 165 lb 9.6 oz (75.1 kg)   SpO2 96%   BMI 23.10 kg/m  General:  Well developed, well nourished, in no apparent distress Lungs:  CTAB, no access msc use Cardio:  RRR, no bruits, no LE edema Psych: Age appropriate judgment and insight  Assessment:   Type 2 diabetes mellitus with hyperglycemia, without long-term current use of insulin  (HCC) - Plan: Comprehensive metabolic panel, Hemoglobin A1c, Lipid panel  Pure hypercholesterolemia   Plan:   Chronic, stable.  Continue metformin  500 mg once daily, Actos  30 mg daily, glyburide  5 mg twice daily.  Counseled on diet and exercise. Chronic, stable.  Continue Lipitor  80 mg daily. Flu shot  today. F/u in 6 mo. The patient voiced understanding and agreement to the plan.  Mabel [place], DO 10/09/23 12:53 PM

## 2023-10-09 NOTE — Patient Instructions (Addendum)
 Give us  2-3 business days to get the results of your labs back.   Keep the diet clean and stay active.  Consider getting your 2nd Shingrix/shingles vaccine at your convenience. You should go to your pharmacy to get this.   If you go days where you don't eat that much, drop your glyburide  for that time.   Let us  know if you need anything.

## 2023-11-01 ENCOUNTER — Other Ambulatory Visit: Payer: Self-pay | Admitting: Student

## 2023-11-24 NOTE — OR Nursing (Signed)
 Dilatation:  Site esophagus  Balloon size(s):10-79mm  Quantity used:x1

## 2023-11-28 ENCOUNTER — Ambulatory Visit (INDEPENDENT_AMBULATORY_CARE_PROVIDER_SITE_OTHER): Payer: Medicare Other | Admitting: Family Medicine

## 2023-11-28 ENCOUNTER — Encounter: Payer: Self-pay | Admitting: Family Medicine

## 2023-11-28 VITALS — BP 124/62 | HR 50 | Resp 20 | Ht 71.0 in | Wt 161.8 lb

## 2023-11-28 DIAGNOSIS — R413 Other amnesia: Secondary | ICD-10-CM | POA: Diagnosis not present

## 2023-11-28 MED ORDER — DONEPEZIL HCL 5 MG PO TABS: 5.0000 mg | ORAL_TABLET | Freq: Every day | ORAL | 1 refills | Status: DC

## 2023-11-28 NOTE — Patient Instructions (Addendum)
 Give [place] 2-3 business days to get the results of your labs back.   Keep the diet clean and stay active.  Someone will reach out regarding your MRI scheduling.   If you do not hear anything about your referral in the next 1-2 weeks, call our office and ask for an update.  Let us know if you need anything.

## 2023-11-28 NOTE — Progress Notes (Signed)
 Chief Complaint  Patient presents with   Memory Loss    Patient presents today for increased memory loss     Subjective: Patient is a 73 y.o. male here for memory issues.  He is here with his wife.  For the past 5 years, his wife has noticed worsening memory with respect to appointments, names, and overall happenings.  He is able to remain active at home and get things done in the yard.  His father had issues with dementia.  No recent medication changes, depression/anxiety, or head trauma.  Past Medical History:  Diagnosis Date   Acquired esophageal ring    Asthma    uses inhaler if needed   CAD (coronary artery disease)    a. s/p NSTEMI in 10/2019 with DES to LAD   Cancer (HCC)    skin- upper back and lower back   Cataract    bilateral-removed   DM type 2 (diabetes mellitus, type 2) (HCC)    GERD (gastroesophageal reflux disease)    on meds   History of esophagitis    Hyperlipidemia    on meds   Hypertension    on meds    Objective: BP 124/62   Pulse (!) 50   Resp 20   Ht 5\' 11"  (1.803 m)   Wt 161 lb 12.8 oz (73.4 kg)   SpO2 94%   BMI 22.57 kg/m  General: Awake, appears stated age Heart: Regular rhythm, bradycardic, no LE edema Lungs: CTAB, no rales, wheezes or rhonchi. No accessory muscle use Neuro: DTRs equal and symmetric throughout, no clonus, no cerebellar signs, grip strength adequate; alert to place and person only Psych: Normal affect  Assessment and Plan: Memory change - Plan: MR Brain Wo Contrast, Ambulatory referral to Psychology, CBC, Comprehensive metabolic panel, TSH, B12, donepezil (ARICEPT) 5 MG tablet  Chronic, not currently controlled.  Check above labs, MRI brain, formal evaluation for memory issues.  Start Aricept 5 mg daily.  Follow-up in 1 month to recheck. The patient and his spouse voiced understanding and agreement to the plan.  Jilda Roche [place], DO 11/28/23  2:06 PM

## 2023-11-29 ENCOUNTER — Encounter: Payer: Self-pay | Admitting: Family Medicine

## 2023-11-29 LAB — COMPREHENSIVE METABOLIC PANEL
ALT: 10 U/L (ref 0–53)
AST: 17 U/L (ref 0–37)
Albumin: 4 g/dL (ref 3.5–5.2)
Alkaline Phosphatase: 71 U/L (ref 39–117)
BUN: 18 mg/dL (ref 6–23)
CO2: 26 meq/L (ref 19–32)
Calcium: 9.3 mg/dL (ref 8.4–10.5)
Chloride: 100 meq/L (ref 96–112)
Creatinine, Ser: 0.96 mg/dL (ref 0.40–1.50)
GFR: 78.86 mL/min (ref >60.00)
Glucose, Bld: 325 mg/dL — ABNORMAL HIGH (ref 70–99)
Potassium: 4.5 meq/L (ref 3.5–5.1)
Sodium: 137 meq/L (ref 135–145)
Total Bilirubin: 0.8 mg/dL (ref 0.2–1.2)
Total Protein: 6.9 g/dL (ref 6.0–8.3)

## 2023-11-29 LAB — TSH: TSH: 2.94 u[IU]/mL (ref 0.35–5.50)

## 2023-11-29 LAB — CBC
HCT: 40.1 % (ref 39.0–52.0)
Hemoglobin: 13.3 g/dL (ref 13.0–17.0)
MCHC: 33.2 g/dL (ref 30.0–36.0)
MCV: 95.1 fl (ref 78.0–100.0)
Platelets: 244 10*3/uL (ref 150.0–400.0)
RBC: 4.22 Mil/uL (ref 4.22–5.81)
RDW: 14.3 % (ref 11.5–15.5)
WBC: 6.3 10*3/uL (ref 4.0–10.5)

## 2023-11-29 LAB — VITAMIN B12: Vitamin B-12: 496 pg/mL (ref 211–911)

## 2023-11-30 ENCOUNTER — Other Ambulatory Visit: Payer: Self-pay | Admitting: Family Medicine

## 2023-11-30 DIAGNOSIS — R413 Other amnesia: Secondary | ICD-10-CM

## 2023-12-10 ENCOUNTER — Ambulatory Visit (HOSPITAL_COMMUNITY)
Admission: RE | Admit: 2023-12-10 | Discharge: 2023-12-10 | Disposition: A | Discharge status: Home / Self Care | Source: Ambulatory Visit | Attending: Family Medicine

## 2023-12-10 DIAGNOSIS — R413 Other amnesia: Secondary | ICD-10-CM | POA: Diagnosis present

## 2023-12-23 ENCOUNTER — Other Ambulatory Visit: Payer: Self-pay | Admitting: Family Medicine

## 2023-12-29 ENCOUNTER — Ambulatory Visit: Admitting: Family Medicine

## 2023-12-29 ENCOUNTER — Encounter: Payer: Self-pay | Admitting: Family Medicine

## 2023-12-29 VITALS — BP 124/86 | HR 67 | Temp 97.6°F | Resp 16 | Ht 71.0 in | Wt 164.2 lb

## 2023-12-29 DIAGNOSIS — R413 Other amnesia: Secondary | ICD-10-CM | POA: Diagnosis not present

## 2023-12-29 MED ORDER — DONEPEZIL HCL 10 MG PO TABS: 10.0000 mg | ORAL_TABLET | Freq: Every day | ORAL | 1 refills | Status: DC

## 2023-12-29 NOTE — Progress Notes (Signed)
 Chief Complaint  Patient presents with   Medical Management of Chronic Issues    Patient presents today for 1 month follow-up    Subjective: Patient is a 73 y.o. male here for f/u. Here w spouse.   Started on Aricept 5 mg/d. Reports compliance, no AE's. No change in memory. No obvious anxiety/depression w visit to psychology. Not much exercise, diet is OK.   Past Medical History:  Diagnosis Date   Acquired esophageal ring    Asthma    uses inhaler if needed   CAD (coronary artery disease)    a. s/p NSTEMI in 10/2019 with DES to LAD   Cancer (HCC)    skin- upper back and lower back   Cataract    bilateral-removed   DM type 2 (diabetes mellitus, type 2) (HCC)    GERD (gastroesophageal reflux disease)    on meds   History of esophagitis    Hyperlipidemia    on meds   Hypertension    on meds    Objective: BP 124/86   Pulse 67   Temp 97.6 F (36.4 C)   Resp 16   Ht 5\' 11"  (1.803 m)   Wt 164 lb 3.2 oz (74.5 kg)   SpO2 98%   BMI 22.90 kg/m  General: Awake, appears stated age Heart: RRR Neuro: Patellar DTR's 2/4 b/l, no clonus, alert to person, place (knows it is a medical building); unsure of date/president Lungs: CTAB, no rales, wheezes or rhonchi. No accessory muscle use Psych: Limited judgment and insight, flat affect  Assessment and Plan: Memory change - Plan: donepezil (ARICEPT) 10 MG tablet  Chronic, not stable. Increase Aricept to 10 mg qhs. F/u in 1 mo. MOCA, if moderate severity, will start Namenda. Counseled on diet/exercise.  The patient and his spouse voiced understanding and agreement to the plan.  Jilda Roche [place], DO 12/29/23  11:24 AM

## 2023-12-29 NOTE — Patient Instructions (Addendum)
 Stay active.  Continue doing things that stimulate your brain.   Let us know if you need anything.

## 2024-01-18 LAB — HM DIABETES EYE EXAM

## 2024-02-28 NOTE — OR Nursing (Addendum)
 Balloon esophagus dilation to 8mm, 9mm, 10mm by MD.  Focal dilations then performed at 10mm, 11mm, 12mm by MD.  Pt tolerated well. Suctioning performed during procedure and scope removal.

## 2024-02-28 NOTE — Anesthesia Postprocedure Evaluation (Signed)
 Patient: Alfred Clay  Scheduled: Procedure(s) with comments: UGI ENDOSCOPY; WITH BIOPSY, SINGLE OR MULTIPLE - Urgent Reed/Cotton/Arora EGD Propofol  MC inst sent per pt's wife G3: Complete Referral Notes:  known esophageal LP wtih diffuse strictures. dilated to 12 mm at last visit. seeing dermatology  Scheduled by pt's wife Adrien UGI ENDO; W/BALLOON DILAT ESOPHAGUS (<30MM DIAM)  Anesthesia Start Date/Time: 02/28/24 0955   Procedures:      UGI ENDOSCOPY; WITH BIOPSY, SINGLE OR MULTIPLE - Urgent Reed/Cotton/Arora EGD Propofol  MC inst sent per pt's wife G3: Complete Referral Notes:  known esophageal LP wtih diffuse strictures. dilated to 12 mm at last  visit. 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    Level of consciousness: alert and awake Post vitals: stable PONV Present? no Pain score: 0 Pain management: adequate Airway patency: patent Cardiovascular status: acceptable and stable Respiratory status: acceptable, room air, nonlabored ventilation and spontaneous ventilation Hydration status: acceptable  PACU PONV Meds: None  PACU Pain Meds: None  Anesthetic complications: There were no known notable events for this encounter.   _______________ Morene LITTIE Westley, DO 02/28/24

## 2024-03-04 ENCOUNTER — Encounter: Payer: Self-pay | Admitting: Family Medicine

## 2024-03-04 ENCOUNTER — Ambulatory Visit (INDEPENDENT_AMBULATORY_CARE_PROVIDER_SITE_OTHER): Admitting: Family Medicine

## 2024-03-04 VITALS — BP 124/70 | HR 81 | Temp 98.0°F | Resp 16 | Ht 71.0 in | Wt 160.0 lb

## 2024-03-04 DIAGNOSIS — F03B0 Unspecified dementia, moderate, without behavioral disturbance, psychotic disturbance, mood disturbance, and anxiety: Secondary | ICD-10-CM | POA: Insufficient documentation

## 2024-03-04 MED ORDER — MEMANTINE HCL 5 MG PO TABS: 5.0000 mg | ORAL_TABLET | Freq: Two times a day (BID) | ORAL | 2 refills | Status: AC

## 2024-03-04 NOTE — Patient Instructions (Signed)
 If you do not hear anything about your referral in the next 1-2 weeks, call our office and ask for an update.  Let us know if you need anything.

## 2024-03-04 NOTE — Progress Notes (Signed)
 CC: F/u  Subjective: Patient is a 73 y.o. male here for f/u. Here w his wife.   Currently on Aricept  10 mg/d. Compliant, no AE's. Here for f/u MOCA. Feels Aricept  helped his mood and mentation. Mood is stable currently. No irritability or hallucinations.   Past Medical History:  Diagnosis Date   Acquired esophageal ring    Asthma    uses inhaler if needed   CAD (coronary artery disease)    a. s/p NSTEMI in 10/2019 with DES to LAD   Cancer (HCC)    skin- upper back and lower back   Cataract    bilateral-removed   DM type 2 (diabetes mellitus, type 2) (HCC)    GERD (gastroesophageal reflux disease)    on meds   History of esophagitis    Hyperlipidemia    on meds   Hypertension    on meds    Objective: BP 124/70 (BP Location: Left Arm, Patient Position: Sitting)   Pulse 81   Temp 98 F (36.7 C) (Oral)   Resp 16   Ht 5\' 11"  (1.803 m)   Wt 160 lb (72.6 kg)   SpO2 96%   BMI 22.32 kg/m  General: Awake, appears stated age Lungs: No accessory muscle use Psych: normal affect and mood  Assessment and Plan: Moderate dementia without behavioral disturbance, psychotic disturbance, mood disturbance, or anxiety, unspecified dementia type (HCC) - Plan: Ambulatory referral to Neurology  Chronic, not ideally controlled. 16/30 on MOCA. Start Namenda 5 mg bid and if doing well, will increase to 10 mg bid.  Refer Neuro for their opinion and to ensure I am not missing anything. Counseled on exercise. F/u as originally scheduled.  The patient and his wife voiced understanding and agreement to the plan.  Shellie Dials [place], DO 03/04/24  12:15 PM

## 2024-03-07 ENCOUNTER — Other Ambulatory Visit: Payer: Self-pay | Admitting: Family Medicine

## 2024-03-07 DIAGNOSIS — E1165 Type 2 diabetes mellitus with hyperglycemia: Secondary | ICD-10-CM

## 2024-03-27 NOTE — Progress Notes (Unsigned)
 Cardiology Office Note    Date:  03/28/2024  ID:  Alfred Clay 1951-08-28, MRN [id number] Cardiologist: Oneil Parchment, MD    History of Present Illness:    Alfred Clay is a 73 y.o. 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  He was last examined by myself in 12/2022 and remained active at baseline and denied any recent anginal symptoms. No changes were made to his cardiac medications and he was continued on ASA 81 mg daily, Atorvastatin  80 mg daily, Toprol -XL 12.5 mg daily and Repatha .  In talking with the patient and his wife today, he reports overall doing well from a cardiac perspective since his last office visit. He denies any recent chest pain or dyspnea on exertion. No specific palpitations, orthopnea, PND or pitting edema. Alfred Clay is the most active thing he does routinely and he denies any symptoms with this. Does report dizziness with positional changes and he reports consuming 6-8 cups of coffee a day but no water or other fluids. He is being followed closely by GI at [facility] given lichen planus and has already undergone multiple endoscopies this year.  Studies Reviewed:   EKG: EKG is ordered today and demonstrates:   EKG Interpretation Date/Time:  Thursday March 28 2024 10:56:24 EDT Ventricular Rate:  60 PR Interval:  154 QRS Duration:  74 QT Interval:  370 QTC Calculation: 370 R Axis:   73  Text Interpretation: Normal sinus rhythm Normal ECG Confirmed by Johnson Grate ([id number]) on 03/28/2024 11:04:46 AM       Cardiac Catheterization: 10/2019 Prox RCA to Mid RCA lesion is 10% stenosed. 1st Mrg lesion is 30% stenosed. Mid Cx to Dist Cx lesion is 20% stenosed. Prox LAD to Mid LAD lesion is 99% stenosed. Mid LAD lesion is 30%  stenosed. Dist LAD lesion is 20% stenosed. A drug-eluting stent was successfully placed using a SYNERGY XD 3.0X32. Post intervention, there is a 0% residual stenosis. The left ventricular systolic function is normal. LV end diastolic pressure is normal. The left ventricular ejection fraction is 35-45% by visual estimate. There is no mitral valve regurgitation.   1. NSTEMI secondary to sub-total occlusion of the mid LAD 2. The LAD is a large caliber vessel that courses to the apex. The mid vessel has a thrombotic sub-total (99%) occlusion with flow into the distal vessel.  3. The Circumflex has mild disease 4. The RCA is a large dominant vessel with mild mid disease 5. Successful PTCA/DES x 1 mid LAD 6. Moderate segmental LV systolic dysfunction with hypokinesis of the anterior wall, apex and inferoapical wall   Recommendation: Will admit to the ICU. Will continue Aggrastat  for 2 hours post PCI. Continue ASA and Brilinta  for one year. Continue high intensity statin. Will start low dose beta blocker. Echo in am.   Limited Echo: 11/2019 IMPRESSIONS     1. Limited study to assess LV function; normal LV systolic and diastolic  function; mild LVH; trace AI.   2. Left ventricular ejection fraction, by estimation, is 60 to 65%. The  left ventricle has normal function. The left ventricle has no regional  wall motion abnormalities. There is mild left ventricular hypertrophy.  Left ventricular diastolic parameters  were normal.   3. Right ventricular systolic function  is normal. The right ventricular  size is normal.   4. The mitral valve is normal in structure. Trivial mitral valve  regurgitation. No evidence of mitral stenosis.   5. The aortic valve was not assessed. Aortic valve regurgitation is  trivial.   6. The inferior vena cava is normal in size with greater than 50%  respiratory variability, suggesting right atrial pressure of 3 mmHg.    Physical Exam:   VS:  BP 108/68 (BP  Location: Right Arm, Cuff Size: Normal)   Pulse 70   Ht 5' 11 (1.803 m)   Wt 171 lb (77.6 kg)   BMI 23.85 kg/m    Wt Readings from Last 3 Encounters:  03/28/24 171 lb (77.6 kg)  03/04/24 160 lb (72.6 kg)  12/29/23 164 lb 3.2 oz (74.5 kg)     GEN: Well nourished, well developed male appearing in no acute distress NECK: No JVD; No carotid bruits CARDIAC: RRR, no murmurs, rubs, gallops RESPIRATORY:  Clear to auscultation without rales, wheezing or rhonchi  ABDOMEN: Appears non-distended. No obvious abdominal masses. EXTREMITIES: No clubbing or cyanosis. No pitting edema.  Distal pedal pulses are 2+ bilaterally.   Assessment and Plan:   1. Coronary artery disease involving native coronary artery of native heart without angina pectoris - He previously had an NSTEMI in 10/2019 with DES to LAD. He remains active at baseline denies any recent chest pain or dyspnea on exertion. Continue current medical therapy with ASA 81 mg daily, Atorvastatin  80 mg daily, Toprol -XL 12.5 mg daily and Repatha .  2. History of cardiomyopathy - His EF was previously 30 to 35% in 10/2019 and had normalized to 60 to 65% by repeat imaging in 11/2019.  He appears euvolemic by examination today and denies any recent respiratory issues. Continue Toprol -XL 12.5 mg daily. No longer on diuretic therapy.  3. Essential hypertension - BP is well-controlled at 108/68 during today's visit. He does report episodic dizziness at times but does not consume a significant amount of fluids. Was encouraged to increase water consumption.  - We reviewed possibly stopping Toprol -XL but he wishes to continue current medical therapy for now. I encouraged him to make us  aware if his dizziness progresses as we could stop Toprol -XL 12.5 mg daily.  4. Hyperlipidemia LDL goal <70 - LDL was at 79 when checked in 09/2023. They report Repatha  is not currently being covered by his insurance. Will reach out to pharmacy today to see if any  financial assistance options are available. Continue Atorvastatin  80 mg daily. If PCSK9 inhibitor therapy unaffordable, could add Zetia 10 mg daily since LDL was close to goal when checked earlier this year and he was not on Repatha  at that time.  Signed, Laymon CHRISTELLA Qua, PA-C

## 2024-03-28 ENCOUNTER — Ambulatory Visit: Attending: Student | Admitting: Student

## 2024-03-28 ENCOUNTER — Telehealth: Payer: Self-pay

## 2024-03-28 ENCOUNTER — Encounter: Payer: Self-pay | Admitting: Student

## 2024-03-28 ENCOUNTER — Other Ambulatory Visit (HOSPITAL_COMMUNITY): Payer: Self-pay

## 2024-03-28 VITALS — BP 108/68 | HR 70 | Ht 71.0 in | Wt 171.0 lb

## 2024-03-28 DIAGNOSIS — I2510 Atherosclerotic heart disease of native coronary artery without angina pectoris: Secondary | ICD-10-CM | POA: Diagnosis present

## 2024-03-28 DIAGNOSIS — Z8679 Personal history of other diseases of the circulatory system: Secondary | ICD-10-CM | POA: Insufficient documentation

## 2024-03-28 DIAGNOSIS — E785 Hyperlipidemia, unspecified: Secondary | ICD-10-CM | POA: Insufficient documentation

## 2024-03-28 DIAGNOSIS — I10 Essential (primary) hypertension: Secondary | ICD-10-CM | POA: Insufficient documentation

## 2024-03-28 MED ORDER — NITROGLYCERIN 0.4 MG SL SUBL
0.4000 mg | SUBLINGUAL_TABLET | SUBLINGUAL | 2 refills | Status: AC | PRN
Start: 2024-03-28 — End: ?

## 2024-03-28 NOTE — Telephone Encounter (Signed)
 Patient Advocate Encounter   The patient was approved for a [employer] grant that will help cover the cost of REPATHA  Total amount awarded, $2,500.  Effective: 02/27/24 - 02/25/25   APW:[id number] ERW:[id number] Hmnle:[id number] PI:[id number]   Pharmacy provided with approval and processing information.   Ileana Lehmann, CPhT  Pharmacy Patient Advocate Specialist  Direct Number: [phone] Fax: [phone]

## 2024-03-28 NOTE — Patient Instructions (Addendum)
 Medication Instructions:  Your physician recommends that you continue on your current medications as directed. Please refer to the Current Medication list given to you today.  *If you need a refill on your cardiac medications before your next appointment, please call your pharmacy*  Lab Work: NONE   If you have labs (blood work) drawn today and your tests are completely normal, you will receive your results only by: MyChart Message (if you have MyChart) OR A paper copy in the mail If you have any lab test that is abnormal or we need to change your treatment, we will call you to review the results.  Testing/Procedures: NONE   Follow-Up: At [facility], you and your health needs are our priority.  As part of our continuing mission to provide you with exceptional heart care, our providers are all part of one team.  This team includes your primary Cardiologist (physician) and Advanced Practice Providers or APPs (Physician Assistants and Nurse Practitioners) who all work together to provide you with the care you need, when you need it.  Your next appointment:   1 year(s)  Provider:   You may see Oneil Parchment, MD or one of the following Advanced Practice Providers on your designated Care Team:        Patient given grant from [facility] foundation:  The pharmacy has the billing info with instructions and should reach out once they adjust the claim. The [employer] foundation will mail a copy of the billing info on a physical card to his address.    [place], PA-C  [place], PA-C Olivia Pavy, PA-C     We recommend signing up for the patient portal called MyChart.  Sign up information is provided on this After Visit Summary.  MyChart is used to connect with patients for Virtual Visits (Telemedicine).  Patients are able to view lab/test results, encounter notes, upcoming appointments, etc.  Non-urgent messages can be sent to your provider as well.   To learn more  about what you can do with MyChart, go to [email or web address].   Other Instructions Thank you for choosing [facility]!

## 2024-04-08 ENCOUNTER — Ambulatory Visit (INDEPENDENT_AMBULATORY_CARE_PROVIDER_SITE_OTHER): Payer: Medicare Other | Admitting: Family Medicine

## 2024-04-08 VITALS — BP 126/72 | HR 80 | Temp 98.0°F | Resp 16 | Ht 71.0 in | Wt 161.6 lb

## 2024-04-08 DIAGNOSIS — E7800 Pure hypercholesterolemia, unspecified: Secondary | ICD-10-CM

## 2024-04-08 DIAGNOSIS — E1165 Type 2 diabetes mellitus with hyperglycemia: Secondary | ICD-10-CM | POA: Diagnosis not present

## 2024-04-08 DIAGNOSIS — Z7984 Long term (current) use of oral hypoglycemic drugs: Secondary | ICD-10-CM | POA: Diagnosis not present

## 2024-04-08 LAB — LIPID PANEL
Cholesterol: 204 mg/dL — ABNORMAL HIGH (ref 0–200)
HDL: 59.9 mg/dL (ref >39.00)
LDL Cholesterol: 131 mg/dL — ABNORMAL HIGH (ref 0–99)
NonHDL: 144.12
Total CHOL/HDL Ratio: 3
Triglycerides: 66 mg/dL (ref 0.0–149.0)
VLDL: 13.2 mg/dL (ref 0.0–40.0)

## 2024-04-08 LAB — COMPREHENSIVE METABOLIC PANEL WITH GFR
ALT: 12 U/L (ref 0–53)
AST: 18 U/L (ref 0–37)
Albumin: 4.3 g/dL (ref 3.5–5.2)
Alkaline Phosphatase: 84 U/L (ref 39–117)
BUN: 21 mg/dL (ref 6–23)
CO2: 26 meq/L (ref 19–32)
Calcium: 9.6 mg/dL (ref 8.4–10.5)
Chloride: 103 meq/L (ref 96–112)
Creatinine, Ser: 0.89 mg/dL (ref 0.40–1.50)
GFR: 85.28 mL/min (ref >60.00)
Glucose, Bld: 67 mg/dL — ABNORMAL LOW (ref 70–99)
Potassium: 4.5 meq/L (ref 3.5–5.1)
Sodium: 138 meq/L (ref 135–145)
Total Bilirubin: 1 mg/dL (ref 0.2–1.2)
Total Protein: 7 g/dL (ref 6.0–8.3)

## 2024-04-08 LAB — MICROALBUMIN / CREATININE URINE RATIO
Creatinine,U: 166.8 mg/dL
Microalb Creat Ratio: 5.7 mg/g (ref 0.0–30.0)
Microalb, Ur: 1 mg/dL (ref 0.0–1.9)

## 2024-04-08 LAB — HEMOGLOBIN A1C: Hgb A1c MFr Bld: 7.2 % — ABNORMAL HIGH (ref 4.6–6.5)

## 2024-04-08 NOTE — Progress Notes (Signed)
 Subjective:   Chief Complaint  Patient presents with   Follow-up    Follow Up    Alfred Clay is a 73 y.o. male here for follow-up of diabetes.   Alfred Clay does not routinely check his sugars.  Patient does not require insulin .   Medications include: Glyburide  5 mg bid, metformin  500 mg bid, Actos  30 mg/d.  Diet is usually healthy.  Exercise: active in yard.  Hyperlipidemia Patient presents for hyperlipidemia follow up. Currently being treated with Lipitor  80 mg/d and compliance with treatment thus far has been good. He denies myalgias. Diet/exercise as above.  No CP or SOB.  The patient is known to have coexisting coronary artery disease.  Past Medical History:  Diagnosis Date   Acquired esophageal ring    Asthma    uses inhaler if needed   CAD (coronary artery disease)    a. s/p NSTEMI in 10/2019 with DES to LAD   Cancer (HCC)    skin- upper back and lower back   Cataract    bilateral-removed   DM type 2 (diabetes mellitus, type 2) (HCC)    GERD (gastroesophageal reflux disease)    on meds   History of esophagitis    Hyperlipidemia    on meds   Hypertension    on meds     Related testing: Retinal exam: Done Pneumovax: done  Objective:  BP 126/72 (BP Location: Left Arm, Patient Position: Sitting)   Pulse 80   Temp 98 F (36.7 C) (Oral)   Resp 16   Ht 5' 11 (1.803 m)   Wt 161 lb 9.6 oz (73.3 kg)   SpO2 95%   BMI 22.54 kg/m  General:  Well developed, well nourished, in no apparent distress Skin:  Warm, no pallor or diaphoresis Head:  Normocephalic, atraumatic Eyes:  Pupils equal and round, sclera anicteric without injection  Lungs:  CTAB, no access msc use Cardio:  RRR, no bruits, no LE edema Musculoskeletal:  Symmetrical muscle groups noted without atrophy or deformity Neuro:  Sensation intact to pinprick on feet Psych: Nml affect  Assessment:   Type 2 diabetes mellitus with hyperglycemia, without long-term current use of insulin  (HCC) - Plan:  Comprehensive metabolic panel with GFR, Hemoglobin A1c, Lipid panel, Microalbumin / creatinine urine ratio  Pure hypercholesterolemia   Plan:   Chronic, stable. Cont glyburide  5 mg bid, Actos  30 mg/d, metformin  500 mg bid. Counseled on diet and exercise. Chronic, stable. Cont Lipitor  80 mg/d. F/u in 6 mo. The patient and his spouse voiced understanding and agreement to the plan.  Mabel Mt [place], DO 04/08/24 12:36 PM

## 2024-04-08 NOTE — Patient Instructions (Signed)
 Give [place] 2-3 business days to get the results of your labs back.   Keep the diet clean and stay active.  Let us know if you need anything.

## 2024-04-09 ENCOUNTER — Ambulatory Visit: Payer: Self-pay | Admitting: Family Medicine

## 2024-04-25 ENCOUNTER — Other Ambulatory Visit: Payer: Self-pay | Admitting: Family Medicine

## 2024-05-01 ENCOUNTER — Other Ambulatory Visit: Payer: Self-pay | Admitting: Student

## 2024-05-11 ENCOUNTER — Encounter (HOSPITAL_COMMUNITY): Payer: Self-pay

## 2024-05-11 ENCOUNTER — Other Ambulatory Visit: Payer: Self-pay

## 2024-05-11 ENCOUNTER — Emergency Department (HOSPITAL_COMMUNITY)
Admission: EM | Admit: 2024-05-11 | Discharge: 2024-05-11 | Disposition: A | Discharge status: Home / Self Care | Attending: Emergency Medicine | Admitting: Emergency Medicine

## 2024-05-11 DIAGNOSIS — T18128A Food in esophagus causing other injury, initial encounter: Secondary | ICD-10-CM | POA: Diagnosis present

## 2024-05-11 DIAGNOSIS — Z7982 Long term (current) use of aspirin: Secondary | ICD-10-CM | POA: Diagnosis not present

## 2024-05-11 DIAGNOSIS — X58XXXA Exposure to other specified factors, initial encounter: Secondary | ICD-10-CM | POA: Insufficient documentation

## 2024-05-11 DIAGNOSIS — R1310 Dysphagia, unspecified: Secondary | ICD-10-CM

## 2024-05-11 NOTE — Discharge Instructions (Signed)
 Please follow-up closely with your gastroenterologist on an outpatient basis.  Return to emergency department immediately for any new or worsening symptoms.

## 2024-05-11 NOTE — ED Provider Notes (Signed)
 [place] EMERGENCY DEPARTMENT AT [facility] Provider Note   CSN: [id number] Arrival date & time: 05/11/24  1516     Patient presents with: Food Bolus   Alfred Clay is a 73 y.o. male.   Patient is a 73 year old male who presents to the emergency department with a chief complaint of possible food bolus.  He notes he does have a history of this in the past.  He notes that he is currently followed by gastroenterology and his last upper endoscopy was approximately 3 months ago with dilation.  On presentation to the emergency department patient did note that the obstruction feels as though it has passed while in triage.  On my initial evaluation he is tolerating secretions without difficulty and denies any continued sensation of food bolus.        Prior to Admission medications   Medication Sig Start Date End Date Taking? Authorizing Provider  albuterol  (VENTOLIN  HFA) 108 (90 Base) MCG/ACT inhaler Inhale 2 puffs into the lungs every 6 (six) hours as needed for wheezing or shortness of breath. 09/08/22   Leath-Warren, Etta PARAS, NP  aspirin  EC 81 MG tablet Take 1 tablet (81 mg total) by mouth daily. 11/08/19   Jerrie Anger, PA  atorvastatin  (LIPITOR ) 80 MG tablet TAKE 1 TABLET EVERY MORNING 04/25/24   Wendling, Mabel Mt, DO  BUDESONIDE PO Take by mouth.    [provider]  donepezil  (ARICEPT ) 10 MG tablet Take 1 tablet (10 mg total) by mouth at bedtime. 12/29/23   Frann Mabel Mt, DO  Evolocumab  (REPATHA  SURECLICK) 140 MG/ML SOAJ INJECT 140MG  UNDER THE SKIN EVERY 2 WEEKS 05/02/24   Jeffrie Oneil BROCKS, MD  fluticasone  (FLOVENT  HFA) 110 MCG/ACT inhaler Inhale 2 puffs into the lungs 2 (two) times daily.    [provider]  glyBURIDE  (DIABETA ) 5 MG tablet TAKE 1 TABLET TWICE DAILY WITH MEALS 03/07/24   Wendling, Mabel Mt, DO  memantine  (NAMENDA ) 5 MG tablet Take 1 tablet (5 mg total) by mouth 2 (two) times daily. 03/04/24   Frann Mabel Mt, DO  metFORMIN  (GLUCOPHAGE ) 500 MG tablet TAKE 1 TABLET TWICE DAILY WITH MEALS 12/25/23   Wendling, Mabel Mt, DO  metoprolol  succinate (TOPROL -XL) 25 MG 24 hr tablet TAKE 1/2 TABLET EVERY DAY 11/01/23   Strader, Laymon CHRISTELLA, PA-C  nitroGLYCERIN  (NITROSTAT ) 0.4 MG SL tablet Place 1 tablet (0.4 mg total) under the tongue every 5 (five) minutes as needed. 03/28/24   Strader, Brittany M, PA-C  pantoprazole  (PROTONIX ) 40 MG tablet TAKE 1 TABLET TWICE DAILY 05/02/22   Cirigliano, Vito V, DO  pioglitazone  (ACTOS ) 30 MG tablet TAKE 1 TABLET EVERY DAY 04/25/24   Wendling, Mabel Mt, DO    Allergies: Ace inhibitors    Review of Systems  Gastrointestinal:        Dysphagia  All other systems reviewed and are negative.   Updated Vital Signs BP 104/66   Pulse 77   Temp 98.4 F (36.9 C) (Oral)   Ht 6' (1.829 m)   Wt 73.9 kg   SpO2 95%   BMI 22.11 kg/m   Physical Exam Vitals and nursing note reviewed.  Constitutional:      Appearance: Normal appearance.  HENT:     Head: Normocephalic and atraumatic.     Nose: Nose normal.     Mouth/Throat:     Mouth: Mucous membranes are moist.  Eyes:     Extraocular Movements: Extraocular movements intact.     Conjunctiva/sclera: Conjunctivae  normal.     Pupils: Pupils are equal, round, and reactive to light.  Cardiovascular:     Rate and Rhythm: Normal rate and regular rhythm.     Pulses: Normal pulses.     Heart sounds: Normal heart sounds. No murmur heard.    No gallop.  Pulmonary:     Effort: Pulmonary effort is normal. No respiratory distress.     Breath sounds: Normal breath sounds. No wheezing or rales.  Abdominal:     General: Abdomen is flat. Bowel sounds are normal. There is no distension.     Palpations: Abdomen is soft.     Tenderness: There is no abdominal tenderness. There is no guarding.  Musculoskeletal:        General: Normal range of motion.     Cervical back: Normal range of motion and neck supple.  Skin:    General:  Skin is warm and dry.  Neurological:     General: No focal deficit present.     Mental Status: He is alert and oriented to person, place, and time. Mental status is at baseline.  Psychiatric:        Mood and Affect: Mood normal.        Behavior: Behavior normal.        Thought Content: Thought content normal.        Judgment: Judgment normal.     (all labs ordered are listed, but only abnormal results are displayed) Labs Reviewed - No data to display  EKG: None  Radiology: No results found.   Procedures   Medications Ordered in the ED - No data to display                                  Medical Decision Making Patient is doing well at this time and is stable for discharge home.  Appears the fluid bolus has passed at this point.  He has been given liquids to drink and has tolerated this without difficulty.  He is tolerant secretion without difficulty at this time.  There is been no vomiting while in the emergency department.  Patient notes he will contact his gastroenterologist on Monday for close follow-up.  He was directed to adhere to a liquid diet until then.  Do not specked any further workup is warranted at this time.  Strict turn precautions were provided for any new or worsening symptoms. patient voiced understanding and had no additional questions.        Final diagnoses:  Dysphagia, unspecified type  Esophageal obstruction due to food impaction    ED Discharge Orders     None          Daralene Lonni JONETTA DEVONNA 05/11/24 1642    Suzette Pac, MD 05/12/24 1052

## 2024-05-11 NOTE — ED Triage Notes (Signed)
 Pt stated that he had a piece of a hot dog stuck in his throat, but when he was called for triage and stood up, he stated that the food finally passed. Pt stated he wants to be seen just for safety measures

## 2024-05-14 ENCOUNTER — Ambulatory Visit: Payer: Self-pay

## 2024-05-14 NOTE — Telephone Encounter (Signed)
 FYI Only or Action Required?: FYI only for provider.  Patient was last seen in primary care on 04/08/2024 by Frann Mabel Mt, DO.  Called Nurse Triage reporting Fall and Hypotension.  Symptoms began yesterday.  Interventions attempted: Prescription medications: Tylenol  and Rest, hydration, or home remedies.  Symptoms are: stable.  Triage Disposition: See HCP Within 4 Hours (Or PCP Triage)  Patient/caregiver understands and will follow disposition?: Yes                             Copied from CRM [id number]. Topic: Clinical - Red Word Triage >> May 14, 2024 10:22 AM Alfred Clay wrote: Red Word that prompted transfer to Nurse Triage: Patient's wife is calling in stating the patient fell yesterday, after the fall the patients BP was low. She stated today it is 106/59 but he is feeling weak, and his back hurts. Reason for Disposition  [1] MODERATE weakness (e.g., interferes with work, school, normal activities) AND [2] new-onset or getting worse  Answer Assessment - Initial Assessment Questions 1. MECHANISM: How did the fall happen?     States he felt weak and fell, denies losing consciousness  3. ONSET: When did the fall happen? (e.g., minutes, hours, or days ago)     Yesterday  4. LOCATION: What part of the body hit the ground? (e.g., back, buttocks, head, hips, knees, hands, head, stomach)     States he fell on his side 5. INJURY: Did you hurt (injure) yourself when you fell? If Yes, ask: What did you injure? Tell me more about this? (e.g., body area; type of injury; pain severity)     States he is experiencing pain in his lower back and side at this time, denies hitting head 6. PAIN: Is there any pain? If Yes, ask: How bad is the pain? (e.g., Scale 0-10; or none, mild,      States pain is not bad at all 7. SIZE: For cuts, bruises, or swelling, ask: How large is it? (e.g., inches or centimeters)      Denies bruising, denies swelling,  denies broken skin  9. OTHER SYMPTOMS: Do you have any other symptoms? (e.g., dizziness, fever, weakness; new-onset or worsening).      Denies numbness, denies headache, denies vision changes, denies speech changes, denies dizziness, denies fever, BP 98/60 yesterday, BP 106/59 today  10. CAUSE: What do you think caused the fall (or falling)? (e.g., dizzy spell, tripped)     Weakness yesterday at time of fall, denies weakness at this time    Advised patient to be evaluated by a provider today. No availability in office. Advised in-person evaluation at [facility]. Patient and wife verbalized understanding and agreed to go.  Protocols used: Falls and [facility]

## 2024-05-14 NOTE — Telephone Encounter (Signed)
 Pt going to [place]

## 2024-05-16 ENCOUNTER — Emergency Department (HOSPITAL_COMMUNITY)

## 2024-05-16 ENCOUNTER — Emergency Department (HOSPITAL_COMMUNITY)
Admission: EM | Admit: 2024-05-16 | Discharge: 2024-05-16 | Disposition: A | Discharge status: Home / Self Care | Attending: Emergency Medicine | Admitting: Emergency Medicine

## 2024-05-16 ENCOUNTER — Other Ambulatory Visit: Payer: Self-pay

## 2024-05-16 ENCOUNTER — Encounter (HOSPITAL_COMMUNITY): Payer: Self-pay

## 2024-05-16 DIAGNOSIS — S7002XA Contusion of left hip, initial encounter: Secondary | ICD-10-CM

## 2024-05-16 DIAGNOSIS — S32048A Other fracture of fourth lumbar vertebra, initial encounter for closed fracture: Secondary | ICD-10-CM | POA: Diagnosis not present

## 2024-05-16 DIAGNOSIS — S32040A Wedge compression fracture of fourth lumbar vertebra, initial encounter for closed fracture: Secondary | ICD-10-CM

## 2024-05-16 DIAGNOSIS — Z7982 Long term (current) use of aspirin: Secondary | ICD-10-CM | POA: Insufficient documentation

## 2024-05-16 DIAGNOSIS — M25552 Pain in left hip: Secondary | ICD-10-CM | POA: Diagnosis not present

## 2024-05-16 DIAGNOSIS — W19XXXA Unspecified fall, initial encounter: Secondary | ICD-10-CM | POA: Insufficient documentation

## 2024-05-16 DIAGNOSIS — S3992XA Unspecified injury of lower back, initial encounter: Secondary | ICD-10-CM | POA: Diagnosis present

## 2024-05-16 LAB — BASIC METABOLIC PANEL WITH GFR
Anion gap: 10 (ref 5–15)
BUN: 25 mg/dL — ABNORMAL HIGH (ref 8–23)
CO2: 22 mmol/L (ref 22–32)
Calcium: 9 mg/dL (ref 8.9–10.3)
Chloride: 106 mmol/L (ref 98–111)
Creatinine, Ser: 0.82 mg/dL (ref 0.61–1.24)
GFR, Estimated: >60 mL/min (ref >60)
Glucose, Bld: 187 mg/dL — ABNORMAL HIGH (ref 70–99)
Potassium: 4 mmol/L (ref 3.5–5.1)
Sodium: 138 mmol/L (ref 135–145)

## 2024-05-16 LAB — CBC WITH DIFFERENTIAL/PLATELET
Abs Immature Granulocytes: 0.02 K/uL (ref 0.00–0.07)
Basophils Absolute: 0 K/uL (ref 0.0–0.1)
Basophils Relative: 0 %
Eosinophils Absolute: 0.1 K/uL (ref 0.0–0.5)
Eosinophils Relative: 1 %
HCT: 37.2 % — ABNORMAL LOW (ref 39.0–52.0)
Hemoglobin: 12.3 g/dL — ABNORMAL LOW (ref 13.0–17.0)
Immature Granulocytes: 0 %
Lymphocytes Relative: 19 %
Lymphs Abs: 1.2 K/uL (ref 0.7–4.0)
MCH: 31.8 pg (ref 26.0–34.0)
MCHC: 33.1 g/dL (ref 30.0–36.0)
MCV: 96.1 fL (ref 80.0–100.0)
Monocytes Absolute: 0.5 K/uL (ref 0.1–1.0)
Monocytes Relative: 8 %
Neutro Abs: 4.5 K/uL (ref 1.7–7.7)
Neutrophils Relative %: 72 %
Platelets: 222 K/uL (ref 150–400)
RBC: 3.87 MIL/uL — ABNORMAL LOW (ref 4.22–5.81)
RDW: 13.6 % (ref 11.5–15.5)
WBC: 6.2 K/uL (ref 4.0–10.5)
nRBC: 0 % (ref 0.0–0.2)

## 2024-05-16 MED ORDER — HYDROCODONE-ACETAMINOPHEN 5-325 MG PO TABS
1.0000 | ORAL_TABLET | Freq: Once | ORAL | Status: AC
  Administered 2024-05-16: 1 via ORAL
  Filled 2024-05-16: qty 1

## 2024-05-16 MED ORDER — HYDROCODONE-ACETAMINOPHEN 5-325 MG PO TABS: 2.0000 | ORAL_TABLET | ORAL | 0 refills | Status: DC | PRN

## 2024-05-16 MED ORDER — OXYCODONE HCL 5 MG PO TABS: 5.0000 mg | ORAL_TABLET | Freq: Four times a day (QID) | ORAL | 0 refills | Status: DC | PRN

## 2024-05-16 NOTE — ED Notes (Signed)
 Pt/family received d/c paperwork at this time. After going over the paperwork any questions, comments, or concerns were answered to the best of this nurse's knowledge. The pt/family verbally acknowledged the teachings/instructions.   Pt was given the take home percocet's

## 2024-05-16 NOTE — ED Notes (Signed)
 [facility] called for STAT delivery of TLSO Brace

## 2024-05-16 NOTE — ED Provider Notes (Signed)
  Physical Exam  BP 106/65   Pulse 68   Temp 98.6 F (37 C) (Oral)   Resp 17   Ht 6' (1.829 m)   Wt 72.6 kg   SpO2 100%   BMI 21.70 kg/m   Physical Exam Neurological:     Mental Status: He is alert.     Procedures  Procedures  ED Course / MDM    Medical Decision Making Amount and/or Complexity of Data Reviewed Labs: ordered. Radiology: ordered.  Risk Prescription drug management.  Patient had mechanical fall several days ago and having a lot of hip pain, pending CT in light of the amount of pain despite negative x-ray.  Sent taking from [place], [place].  CT pelvis shows no bony abnormalities, CT lumbar spine shows acute or subacute compression fracture L4 with 3 mm of retropulsion of the superior endplate and 60% vertebral body height loss centrally.  Neurosurgery consulted at this time. Spoke with Dr. Cabbell who recommended a brace and outpatient follow-up after discussion of his CT findings.  Patient informed of need for follow-up and he is agreeable.  Pain well-controlled in the ER with Norco.  Discussed opiate precautions at home.  He has a walker at home and has been able to ambulate using this.  He is agreeable to discharge       Alfred Clay 05/16/24 2123    Suzette Pac, MD 05/17/24 1210

## 2024-05-16 NOTE — ED Notes (Addendum)
 External [employer] company has come to apply LSO brace at this time

## 2024-05-16 NOTE — ED Provider Notes (Signed)
 [place] EMERGENCY DEPARTMENT AT [facility] Provider Note   CSN: [id number] Arrival date & time: 05/16/24  1457     Patient presents with: Alfred Clay FED CECI is a 73 y.o. male.   Patient is a 73 year old male who presents to the emergency department with a chief complaint of lower back and left hip pain following a fall which occurred approximately 4 days ago.  Patient notes that he was walking through his kitchen when his left hip gave away and he fell.  He notes he did not strike his head or injure his neck or back during the fall.  He notes that he has had no numbness, paresthesias or unilateral weakness.  He denies any pain to chest or abdomen.  He notes that there was no preceding symptoms to include dizziness, lightheadedness, syncope, chest pain, shortness of breath or palpitations.  He denies any other long bone or joint pain at this time.  He notes that the pain is worse with any attempted ambulation.   Fall       Prior to Admission medications   Medication Sig Start Date End Date Taking? Authorizing Provider  albuterol  (VENTOLIN  HFA) 108 (90 Base) MCG/ACT inhaler Inhale 2 puffs into the lungs every 6 (six) hours as needed for wheezing or shortness of breath. 09/08/22   Leath-Warren, Etta PARAS, NP  aspirin  EC 81 MG tablet Take 1 tablet (81 mg total) by mouth daily. 11/08/19   Jerrie Anger, PA  atorvastatin  (LIPITOR ) 80 MG tablet TAKE 1 TABLET EVERY MORNING 04/25/24   Wendling, Mabel Mt, DO  BUDESONIDE PO Take by mouth.    [provider]  donepezil  (ARICEPT ) 10 MG tablet Take 1 tablet (10 mg total) by mouth at bedtime. 12/29/23   Frann Mabel Mt, DO  Evolocumab  (REPATHA  SURECLICK) 140 MG/ML SOAJ INJECT 140MG  UNDER THE SKIN EVERY 2 WEEKS 05/02/24   Jeffrie Oneil BROCKS, MD  fluticasone  (FLOVENT  HFA) 110 MCG/ACT inhaler Inhale 2 puffs into the lungs 2 (two) times daily.    [provider]  glyBURIDE  (DIABETA ) 5 MG tablet TAKE 1  TABLET TWICE DAILY WITH MEALS 03/07/24   Wendling, Mabel Mt, DO  memantine  (NAMENDA ) 5 MG tablet Take 1 tablet (5 mg total) by mouth 2 (two) times daily. 03/04/24   Frann Mabel Mt, DO  metFORMIN  (GLUCOPHAGE ) 500 MG tablet TAKE 1 TABLET TWICE DAILY WITH MEALS 12/25/23   Frann, Mabel Mt, DO  metoprolol  succinate (TOPROL -XL) 25 MG 24 hr tablet TAKE 1/2 TABLET EVERY DAY 11/01/23   Strader, Laymon CHRISTELLA, PA-C  nitroGLYCERIN  (NITROSTAT ) 0.4 MG SL tablet Place 1 tablet (0.4 mg total) under the tongue every 5 (five) minutes as needed. 03/28/24   Strader, Brittany M, PA-C  pantoprazole  (PROTONIX ) 40 MG tablet TAKE 1 TABLET TWICE DAILY 05/02/22   Cirigliano, Vito V, DO  pioglitazone  (ACTOS ) 30 MG tablet TAKE 1 TABLET EVERY DAY 04/25/24   Wendling, Mabel Mt, DO    Allergies: Ace inhibitors    Review of Systems  Musculoskeletal:        Pain to lower back and left hip  All other systems reviewed and are negative.   Updated Vital Signs BP 106/65   Pulse 68   Temp 98.6 F (37 C) (Oral)   Resp 17   Ht 6' (1.829 m)   Wt 72.6 kg   SpO2 100%   BMI 21.70 kg/m   Physical Exam Vitals and nursing note reviewed.  Constitutional:  Appearance: Normal appearance.  HENT:     Head: Normocephalic and atraumatic.     Nose: Nose normal.     Mouth/Throat:     Mouth: Mucous membranes are moist.  Eyes:     Extraocular Movements: Extraocular movements intact.     Conjunctiva/sclera: Conjunctivae normal.     Pupils: Pupils are equal, round, and reactive to light.  Cardiovascular:     Rate and Rhythm: Normal rate and regular rhythm.     Pulses: Normal pulses.     Heart sounds: Normal heart sounds. No murmur heard.    No gallop.  Pulmonary:     Effort: Pulmonary effort is normal. No respiratory distress.     Breath sounds: Normal breath sounds. No stridor. No wheezing, rhonchi or rales.  Chest:     Chest wall: No tenderness.  Abdominal:     General: Abdomen is flat. Bowel sounds are  normal. There is no distension.     Palpations: Abdomen is soft.     Tenderness: There is no abdominal tenderness. There is no guarding.  Musculoskeletal:        General: Normal range of motion.     Cervical back: Normal range of motion and neck supple. No rigidity or tenderness.     Comments: Tenderness palpation noted over the left hip diffusely, nontender palpation remainder bilateral lower extremities, DP and PT pulses 2+ distally, pelvis stable to AP and lateral compression, nontender to palpation of her bilateral upper extremities, full range of motion noted throughout with painful range of motion noted at the left hip, sensation intact distally, nontender to palpation over thoracic spine, tender to palpation noted over lower lumbar spine  Skin:    General: Skin is warm and dry.     Findings: No bruising or rash.  Neurological:     General: No focal deficit present.     Mental Status: He is alert and oriented to person, place, and time. Mental status is at baseline.     Cranial Nerves: No cranial nerve deficit.     Sensory: No sensory deficit.     Motor: No weakness.     Coordination: Coordination normal.     Gait: Gait normal.  Psychiatric:        Mood and Affect: Mood normal.        Behavior: Behavior normal.        Thought Content: Thought content normal.        Judgment: Judgment normal.     (all labs ordered are listed, but only abnormal results are displayed) Labs Reviewed - No data to display  EKG: None  Radiology: DG Hip Unilat With Pelvis 2-3 Views Left Result Date: 05/16/2024 CLINICAL DATA:  Persistent low back and left hip pain since falling 3 days ago. EXAM: DG HIP (WITH OR WITHOUT PELVIS) 2-3V LEFT COMPARISON:  None Available. FINDINGS: The bones appear adequately mineralized. No evidence of acute fracture, dislocation or femoral head osteonecrosis. The hip joint spaces are preserved. There are scattered vascular calcifications. The soft tissues otherwise appear  unremarkable. IMPRESSION: No evidence of acute fracture or dislocation. Vascular calcifications. Electronically Signed   By: Elsie Perone M.D.   On: 05/16/2024 16:56   DG Lumbar Spine Complete Result Date: 05/16/2024 CLINICAL DATA:  Continued low back and left hip pain since falling in the kitchen 3 days ago. EXAM: LUMBAR SPINE - COMPLETE 4+ VIEW COMPARISON:  No relevant prior imaging available. Prior chest radiographs and chest CT were reviewed, not including  the lower lumbar spine. FINDINGS: There are 5 lumbar type vertebral bodies. There is a superior endplate compression deformity at L4 resulting in approximately 45% loss of vertebral body height. This is radiographically indeterminate in age, although associated sclerosis suggests a nonacute fracture. The additional vertebral bodies are intact. The disc spaces are preserved. There is mild intervertebral spurring and facet hypertrophy. The sacroiliac joints appear normal. IMPRESSION: Superior endplate compression deformity at L4, age indeterminate, although favored to be nonacute. Correlate with point tenderness. This could be further evaluated with CT if clinically warranted. Electronically Signed   By: Elsie Perone M.D.   On: 05/16/2024 16:55     Procedures   Medications Ordered in the ED  HYDROcodone -acetaminophen  (NORCO/VICODIN) 5-325 MG per tablet 1 tablet (has no administration in time range)                                    Medical Decision Making Patient is doing well at this time and does remain stable.  Discussed with patient we will plan for obtaining CT scan imaging of the lumbar spine as well as the left hip given his ongoing pain.  X-ray of the left hip was unremarked for any signs of acute osseous injury or lesions.  Questionable fracture was noted on the x-ray of the lumbar spine and will obtain CT for further evaluation of this.  Blood work was ordered given the patient's history of dysphagia and wife notes that he has  not been tolerating much p.o. intake over the past few days.  Patient did not strike his head during the fall and denies any active headache at this time.  Will sign patient out to [facility], PA-C for final dispo pending CT scan.  Amount and/or Complexity of Data Reviewed Labs: ordered. Radiology: ordered.  Risk Prescription drug management.        Final diagnoses:  None    ED Discharge Orders     None          Alfred Clay 05/16/24 1854    Suzette Pac, MD 05/17/24 1209

## 2024-05-16 NOTE — ED Triage Notes (Addendum)
 Pt reports he fell in the kitchen on Monday and continues to have left hip and lower back pain ever since.  Pt denies any LOC as he remembers falling and denies hitting his head.

## 2024-05-16 NOTE — Discharge Instructions (Addendum)
 It was a pleasure taking care of you today.  You are seen in the ER for low back pain and hip pain after a fall couple of days ago.  Your blood work was reassuring and your CT did not show fracture of your hip but there is a compression fracture of your L4 vertebrae.  We are giving you a brace and we will have you follow-up with the neurosurgery clinic.  Come back to the ER if you have worsening symptoms.  You were prescribed opiate pain medication. This can have many sided effect such as drowsiness, slowed breathing, and conspitation. Do not take it with other medications that cause drowsiness, or drink alcohol while taking it. Take a stool softener over the counter while taking it.   You can also take over-the-counter Tylenol  as directed on the packaging  For the oxycodone  you prescribed you can take half a tablet of your pain is less severe and you do not need the full tablet or full tablet makes you too sleepy.

## 2024-05-17 MED FILL — Oxycodone w/ Acetaminophen Tab 5-325 MG: ORAL | Qty: 6 | Status: AC

## 2024-06-07 ENCOUNTER — Other Ambulatory Visit: Payer: Self-pay | Admitting: Family Medicine

## 2024-06-07 DIAGNOSIS — R413 Other amnesia: Secondary | ICD-10-CM

## 2024-06-24 ENCOUNTER — Encounter: Payer: Self-pay | Admitting: Neurology

## 2024-06-24 ENCOUNTER — Ambulatory Visit: Admitting: Neurology

## 2024-06-24 VITALS — BP 95/54 | HR 67 | Ht 71.0 in | Wt 153.6 lb

## 2024-06-24 DIAGNOSIS — I25119 Atherosclerotic heart disease of native coronary artery with unspecified angina pectoris: Secondary | ICD-10-CM | POA: Diagnosis not present

## 2024-06-24 DIAGNOSIS — Z87898 Personal history of other specified conditions: Secondary | ICD-10-CM

## 2024-06-24 DIAGNOSIS — R634 Abnormal weight loss: Secondary | ICD-10-CM

## 2024-06-24 DIAGNOSIS — Z8719 Personal history of other diseases of the digestive system: Secondary | ICD-10-CM | POA: Diagnosis not present

## 2024-06-24 DIAGNOSIS — Z9189 Other specified personal risk factors, not elsewhere classified: Secondary | ICD-10-CM | POA: Diagnosis not present

## 2024-06-24 DIAGNOSIS — F01B0 Vascular dementia, moderate, without behavioral disturbance, psychotic disturbance, mood disturbance, and anxiety: Secondary | ICD-10-CM

## 2024-06-24 DIAGNOSIS — K222 Esophageal obstruction: Secondary | ICD-10-CM

## 2024-06-24 NOTE — Patient Instructions (Signed)
 It was nice to meet you today.  You have complaints of memory loss: memory loss or changes in cognitive function can have many reasons and does not always mean you have dementia.  There are several conditions and situations that can contribute to subjective or objective memory loss.  These factors include: depression, stress, sleep deprivation or poor sleep from insomnia or sleep apnea, dehydration, fluctuation in blood sugar values, thyroid  or electrolyte dysfunction, medication effects from sedating medications or narcotic pain medication for example and certain vitamin deficiencies such as vitamin B12 deficiency, and anemia. Dementia can be caused by stroke, brain atherosclerosis or brain vascular disease due to vascular risk factors (smoking, high blood pressure, high cholesterol, obesity and uncontrolled diabetes), certain degenerative brain disorders (including Parkinson's disease and Multiple sclerosis) and by Alzheimer's disease or other, more rare and sometimes hereditary causes.   Here is what I would recommend:   Continue with your current medications for your memory.  I am reluctant to increase your memantine  to 10 mg twice daily at this time but this can be considered so long as your blood pressure stays a little bit better and not this low.  Follow-up with your primary care on a regular basis and your other providers as planned/scheduled.   Unfortunately, because of the metal piece in your chest a brain MRI cannot be done. I recommend we proceed with a sleep study to rule out obstructive sleep apnea.  If you have obstructive sleep apnea I will likely recommend treatment with a CPAP or AutoPap machine. Increase your water intake to about 8 cups of water per day, 8 ounce size each.  Limit your caffeine to about 2 cups of coffee per day and avoid alcohol altogether and avoid soda.

## 2024-06-24 NOTE — Progress Notes (Signed)
 Subjective:    Patient ID: Alfred Clay is a 73 y.o. male.  HPI    True Mar, MD, PhD [facility] [address] [address] [place], [place] [address]   Dear Dr. Frann,  I saw your patient, Alfred Clay, upon your kind request in my neurologic clinic today for initial consultation of his memory loss.  The patient is accompanied by his wife today.  As you know, Alfred Clay is a 73 year old male with an underlying medical history of lichen planus, lumbar compression fracture, asthma, coronary artery disease, cardiomyopathy, status post coronary stent placement, skin cancer, cataracts with bilateral surgery, diabetes, reflux disease, history of esophagitis, hyperlipidemia, hypertension, and memory loss, who reports being forgetful and having short-term memory issues.  He does not provide a whole lot of details and history is primarily provided by his wife.  She reports that he has had an approximately 6-year history of memory decline, started when he was having problems with his diabetes and side effects with diabetes medication about 6 years ago. He reports that his dad had memory issues.  Patient has fallen a couple of times.  He currently feels improved and his back pain has been less to the point where he no longer takes Tylenol .  He is not taking any oxycodone  or hydrocodone .  He sleeps fairly well, has a history of snoring in the past but lost a lot of weight within the course of 1 year, started about 6 years ago.  He was diagnosed eventually about a year ago with lichen planus. I reviewed your office note from 03/04/2024.  He has been on donepezil  10 mg daily and was started on Namenda  generic 5 mg twice daily at the time. His MoCA score was 16/30.  I reviewed your office note from 04/08/2024. He has had laboratory workup in the recent past, I reviewed blood test results from the past 6 months in his electronic chart.  Vitamin B12 level on 11/28/2023  was 496, TSH was normal at 2.94.  His blood sugar was elevated at the time at 325.  His hemoglobin A1c from 04/08/2024 was 7.2.  His lipid panel showed an elevated LDL at at 131 at the time, total cholesterol 204, triglycerides 66.  Of note, he does not drink a whole lot of water, maybe up to 2 glasses/day, usually just for his meds.  He likes to drink coffee, typically around 4 cups/day.  He drinks occasional soda and a cayenne.  He has not had any alcohol in the past several months, no history of heavy alcohol use in the past.  He is a non-smoker.  He retired from [employer], he retired in 2021 after his heart attack.  He lives with his wife, wife reports that they have a daughter in [place]  and a son in [place].  He still drives and wife reports that there have been no recent issues with his driving.  She usually always sits with him.  She has not had any concerns but neither one of them drives after dark.  Of note, he recently presented to the emergency room on 05/16/2024 after a fall several days prior.  He was found to have a compression fracture of L4.  He has seen neurosurgery since then.  He had a head CT without contrast through [facility] imaging center on 12/10/2023 with indication of memory loss beginning about a year ago.  I reviewed the results:  IMPRESSION: 1. Age related volume  loss without subjective lobar predominance. No acute or reversible finding. 2. Mucosal inflammatory changes of the sphenoid sinus. Mild frontal sinus mucosal thickening.   In addition, I personally and independently reviewed images through the PACS system.  A brain MRI was ordered in March 2025 but he did not have it done.  His Past Medical History Is Significant For: Past Medical History:  Diagnosis Date   Acquired esophageal ring    Asthma    uses inhaler if needed   CAD (coronary artery disease)    a. s/p NSTEMI in 10/2019 with DES to LAD   Cancer (HCC)    skin- upper back and lower  back   Cataract    bilateral-removed   DM type 2 (diabetes mellitus, type 2) (HCC)    GERD (gastroesophageal reflux disease)    on meds   History of esophagitis    Hyperlipidemia    on meds   Hypertension    on meds    His Past Surgical History Is Significant For: Past Surgical History:  Procedure Laterality Date   BALLOON DILATION N/A 01/18/2021   Procedure: BALLOON DILATION;  Surgeon: San Sandor GAILS, DO;  Location: [facility];  Service: Gastroenterology;  Laterality: N/A;   BALLOON DILATION N/A 02/18/2021   Procedure: BALLOON DILATION;  Surgeon: San Sandor GAILS, DO;  [facility]: [facility];  Service: Gastroenterology;  Laterality: N/A;   BALLOON DILATION N/A 07/26/2021   Procedure: BALLOON DILATION;  Surgeon: Aneita Gwendlyn DASEN, MD;  [facility]: [facility];  Service: Endoscopy;  Laterality: N/A;   BIOPSY  08/06/2019   Procedure: BIOPSY;  Surgeon: San Sandor GAILS, DO;  [facility];  Service: Gastroenterology;;   BIOPSY  01/18/2021   Procedure: BIOPSY;  Surgeon: San Sandor GAILS, DO;  [facility];  Service: Gastroenterology;;   CATARACT EXTRACTION Bilateral 10/2018   COLONOSCOPY  2018   CORONARY STENT INTERVENTION N/A 11/05/2019   Procedure: CORONARY STENT INTERVENTION;  Surgeon: Verlin Lonni BIRCH, MD;  [facility];  Service: Cardiovascular;  Laterality: N/A;   CORONARY/GRAFT ACUTE MI REVASCULARIZATION N/A 11/05/2019   Procedure: Coronary/Graft Acute MI Revascularization;  Surgeon: Verlin Lonni BIRCH, MD;  [facility]: [facility];  Service: Cardiovascular;  Laterality: N/A;   ESOPHAGEAL DILATION  12/02/2020   Procedure: ESOPHAGEAL DILATION;  Surgeon: San Sandor GAILS, DO;  [facility];  Service: Gastroenterology;;   ESOPHAGEAL MANOMETRY N/A 10/20/2021   Procedure: ESOPHAGEAL MANOMETRY (EM);  Surgeon: San Sandor GAILS, DO;  [facility];  Service: Gastroenterology;  Laterality: N/A;    ESOPHAGOGASTRODUODENOSCOPY  10/16/2018   ESOPHAGOGASTRODUODENOSCOPY (EGD) WITH PROPOFOL  N/A 08/06/2019   Procedure: ESOPHAGOGASTRODUODENOSCOPY (EGD) WITH PROPOFOL ;  Surgeon: San Sandor GAILS, DO;  [facility];  Service: Gastroenterology;  Laterality: N/A;  with dilation using ped scope   ESOPHAGOGASTRODUODENOSCOPY (EGD) WITH PROPOFOL  N/A 12/02/2020   Procedure: ESOPHAGOGASTRODUODENOSCOPY (EGD) WITH PROPOFOL ;  Surgeon: San Sandor GAILS, DO;  [facility];  Service: Gastroenterology;  Laterality: N/A;   ESOPHAGOGASTRODUODENOSCOPY (EGD) WITH PROPOFOL  N/A 01/18/2021   Procedure: ESOPHAGOGASTRODUODENOSCOPY (EGD) WITH PROPOFOL ;  Surgeon: San Sandor GAILS, DO;  [facility];  Service: Gastroenterology;  Laterality: N/A;  with dilation   ESOPHAGOGASTRODUODENOSCOPY (EGD) WITH PROPOFOL  N/A 02/18/2021   Procedure: ESOPHAGOGASTRODUODENOSCOPY (EGD) WITH PROPOFOL ;  Surgeon: San Sandor GAILS, DO;  [facility];  Service: Gastroenterology;  Laterality: N/A;   ESOPHAGOGASTRODUODENOSCOPY (EGD) WITH PROPOFOL  N/A 07/26/2021   Procedure: ESOPHAGOGASTRODUODENOSCOPY (EGD) WITH PROPOFOL ;  Surgeon: Aneita Gwendlyn DASEN, MD;  [facility];  Service: Endoscopy;  Laterality: N/A;  with dilation   HEMORRHOID SURGERY     KENALOG  INJECTION  07/26/2021   Procedure: KENALOG  INJECTION;  Surgeon: Aneita Gwendlyn DASEN, MD;  [facility]: [facility];  Service: Endoscopy;;   LEFT HEART CATH AND CORONARY ANGIOGRAPHY N/A 11/05/2019   Procedure: LEFT HEART CATH AND CORONARY ANGIOGRAPHY;  Surgeon: Verlin Lonni BIRCH, MD;  [facility];  Service: Cardiovascular;  Laterality: N/A;   SAVORY DILATION N/A 08/06/2019   Procedure: SAVORY DILATION;  Surgeon: San Sandor GAILS, DO;  [facility];  Service: Gastroenterology;  Laterality: N/A;   SCLEROTHERAPY  01/18/2021   Procedure: SCLEROTHERAPY;  Surgeon: San Sandor GAILS, DO;  [facility]: [facility];  Service:  Gastroenterology;;   SKIN CANCER EXCISION     back for skin cancer   SUBMUCOSAL INJECTION  12/02/2020   Procedure: SUBMUCOSAL INJECTION;  Surgeon: San Sandor GAILS, DO;  [facility]: [facility];  Service: Gastroenterology;;   UPPER GASTROINTESTINAL ENDOSCOPY     WISDOM TOOTH EXTRACTION      His Family History Is Significant For: Family History  Problem Relation Age of Onset   Diabetes Mother    Colon polyps Mother 58   Esophageal cancer Neg Hx    Rectal cancer Neg Hx    Stomach cancer Neg Hx    Colon cancer Neg Hx    Alzheimer's disease Neg Hx     His Social History Is Significant For: Social History   Socioeconomic History   Marital status: Married    Spouse name: Not on file   Number of children: Not on file   Years of education: Not on file   Highest education level: Some college, no degree  Occupational History   Not on file  Tobacco Use   Smoking status: Never   Smokeless tobacco: Never  Vaping Use   Vaping status: Never Used  Substance and Sexual Activity   Alcohol use: Not Currently    Alcohol/week: 1.0 standard drink of alcohol    Types: 1 Standard drinks or equivalent per week   Drug use: Not Currently   Sexual activity: Not on file  Other Topics Concern   Not on file  Social History Narrative   Pt lives with wife    Retired    Social Drivers of Corporate investment banker Strain: Low Risk  (04/08/2024)   Overall Financial Resource Strain (CARDIA)    Difficulty of Paying Living Expenses: Not hard at all  Food Insecurity: No Food Insecurity (04/08/2024)   Hunger Vital Sign    Worried About Running Out of Food in the Last Year: Never true    Ran Out of Food in the Last Year: Never true  Transportation Needs: No Transportation Needs (04/08/2024)   PRAPARE - Administrator, Civil Service (Medical): No    Lack of Transportation (Non-Medical): No  Physical Activity: Inactive (04/08/2024)   Exercise Vital Sign    Days of Exercise per Week: 0  days    Minutes of Exercise per Session: Not on file  Stress: No Stress Concern Present (04/08/2024)   [employer] of Occupational Health - Occupational Stress Questionnaire    Feeling of Stress: Not at all  Social Connections: Socially Isolated (04/08/2024)   Social Connection and Isolation Panel    Frequency of Communication with Friends and Family: Once a week    Frequency of Social Gatherings with Friends and Family: Once a week    Attends Religious Services: Never  Active Member of Clubs or Organizations: No    Attends Banker Meetings: Not on file    Marital Status: Married    His Allergies Are:  Allergies  Allergen Reactions   Ace Inhibitors Cough  :   His Current Medications Are:  Outpatient Encounter Medications as of 06/24/2024  Medication Sig   albuterol  (VENTOLIN  HFA) 108 (90 Base) MCG/ACT inhaler Inhale 2 puffs into the lungs every 6 (six) hours as needed for wheezing or shortness of breath.   aspirin  EC 81 MG tablet Take 1 tablet (81 mg total) by mouth daily.   atorvastatin  (LIPITOR ) 80 MG tablet TAKE 1 TABLET EVERY MORNING   BUDESONIDE PO Take by mouth.   donepezil  (ARICEPT ) 10 MG tablet TAKE 1 TABLET AT BEDTIME   Evolocumab  (REPATHA  SURECLICK) 140 MG/ML SOAJ INJECT 140MG  UNDER THE SKIN EVERY 2 WEEKS   fluticasone  (FLOVENT  HFA) 110 MCG/ACT inhaler Inhale 2 puffs into the lungs 2 (two) times daily.   glyBURIDE  (DIABETA ) 5 MG tablet TAKE 1 TABLET TWICE DAILY WITH MEALS   memantine  (NAMENDA ) 5 MG tablet Take 1 tablet (5 mg total) by mouth 2 (two) times daily.   metFORMIN  (GLUCOPHAGE ) 500 MG tablet TAKE 1 TABLET TWICE DAILY WITH MEALS   metoprolol  succinate (TOPROL -XL) 25 MG 24 hr tablet TAKE 1/2 TABLET EVERY DAY   nitroGLYCERIN  (NITROSTAT ) 0.4 MG SL tablet Place 1 tablet (0.4 mg total) under the tongue every 5 (five) minutes as needed.   pantoprazole  (PROTONIX ) 40 MG tablet TAKE 1 TABLET TWICE DAILY   pioglitazone  (ACTOS ) 30 MG tablet TAKE 1 TABLET  EVERY DAY   HYDROcodone -acetaminophen  (NORCO/VICODIN) 5-325 MG tablet Take 2 tablets by mouth every 4 (four) hours as needed.   oxyCODONE  (ROXICODONE ) 5 MG immediate release tablet Take 1 tablet (5 mg total) by mouth every 6 (six) hours as needed for severe pain (pain score 7-10).   No facility-administered encounter medications on file as of 06/24/2024.  :   Review of Systems:  Out of a complete 14 point review of systems, all are reviewed and negative with the exception of these symptoms as listed below:  Review of Systems  Neurological:        Pt here for memory decline Pt states short term  memory is worse     Objective:  Neurological Exam  Physical Exam Physical Examination:   Vitals:   06/24/24 0808  BP: (!) 95/54  Pulse: 67    General Examination: The patient is a very pleasant 73 y.o. male in no acute distress. He appears well-developed and well-nourished and well groomed.  He denies any orthostatic dizziness.  HEENT: Normocephalic, atraumatic, pupils are equal, round and reactive to light, corrective eyeglasses in place.  Tracking is fairly well-preserved.  No obvious nystagmus. Hearing is grossly intact. Face is symmetric with normal facial animation. Speech is hoarse.  No dysarthria noted. There is mild hypophonia. There is no lip, neck/head, jaw or voice tremor. Neck is supple with full range of passive and active motion. There are no carotid bruits on auscultation. Oropharynx exam reveals: moderate mouth dryness, adequate to marginal dental hygiene and moderate airway crowding, due to redundant soft palate, larger uvula.  Tonsils about 1+, Mallampati class III. Tongue protrudes centrally and palate elevates symmetrically.   Chest: Clear to auscultation without wheezing, rhonchi or crackles noted.  Heart: S1+S2+0, regular and normal without murmurs, rubs or gallops noted.   Abdomen: Soft, non-tender and non-distended.  Extremities: There is no pitting edema in  the  distal lower extremities bilaterally.   Skin: Warm and dry without trophic changes noted.   Musculoskeletal: exam reveals no obvious joint deformities.   Neurologically:  Mental status: The patient is awake, alert and oriented in all 4 spheres. His immediate and remote memory, attention, language skills and fund of knowledge are fair.  He provides limited history, most of his history is provided by his wife.  Appropriate. There is no evidence of aphasia, agnosia, apraxia or anomia. Speech is clear with normal prosody and enunciation. Thought process is linear. Mood is constricted and affect is blunted.   Cranial nerves II - XII are as described above under HEENT exam.   Motor exam: Normal bulk, strength and tone is noted. There is no obvious action or resting tremor.  Fine motor skills and coordination: grossly intact.  Cerebellar testing: No dysmetria or intention tremor. There is no truncal or gait ataxia.  Sensory exam: intact to light touch in the upper and lower extremities.  Gait, station and balance: He stands slowly but without difficulty, stands slightly wider based.  Romberg is not tested due to safety concerns.  Reflexes are 1+ in the upper extremities and absent in the lower extremities.  Perhaps trace in the knees.  Toes are downgoing bilaterally. Posture is age-appropriate and he walks slowly without a walking aid, no shuffling, has preserved arm swing.  He turns slowly.  Assessment and Plan:   In summary, Alfred Clay is a 73 year old male with an underlying medical history of lichen planus, lumbar compression fracture, asthma, coronary artery disease, cardiomyopathy, status post coronary stent placement, skin cancer, cataracts with bilateral surgery, diabetes, reflux disease, history of esophagitis, hyperlipidemia, hypertension, and memory loss, who presents for evaluation of his memory loss of several years duration.  Recent MoCA score in or around June 2025 was 16/30.  He  has been on donepezil  with good tolerance and was recently started on memantine  5 mg twice daily.  We talked about his family history and risk factors for dementia, he has multiple vascular risk factors. I had a long chat with the patient and his wife about my findings and the diagnosis of dementia.   I would like to proceed with a sleep study to rule out sleep apnea but since neither one of them drives after dark, we will proceed with a home sleep test which is a good screening tool for sleep apnea concern.  If he has indeed OSA, I would likely recommend treatment with an AutoPap machine.  We will plan a follow-up accordingly.  For now, we will plan a routine follow-up in this clinic in about 6 months for him to see one of our nurse practitioners.  I am reluctant to increase his memantine  at this time because of low blood pressure.  If his blood pressure remains stable but on the lower end of normal, memantine  could be increased to 10 mg twice daily.  He is reminded to stay well-hydrated and utilize compression stockings as needed.  Below is a summary of my recommendations and our discussion points from today's visit, based on chart review, history and examination. They were given these instructions verbally during the visit in detail and also in writing in the MyChart after visit summary (AVS), which they can access electronically. <<   Continue with your current medications for your memory.  I am reluctant to increase your memantine  to 10 mg twice daily at this time but this can be considered so long as your  blood pressure stays a little bit better and not this low.  Follow-up with your primary care on a regular basis and your other providers as planned/scheduled.   Unfortunately, because of the metal piece in your chest a brain MRI cannot be done. I recommend we proceed with a sleep study to rule out obstructive sleep apnea.  If you have obstructive sleep apnea I will likely recommend treatment with a  CPAP or AutoPap machine. Increase your water intake to about 8 cups of water per day, 8 ounce size each.  Limit your caffeine to about 2 cups of coffee per day and avoid alcohol altogether and avoid soda. >>    This was an extended visit of over 60 minutes with copious record review involved in considerable counseling and coordination of care.    Thank you very much for allowing me to participate in the care of this nice patient. If I can be of any further assistance to you please do not hesitate to call me at [phone].  Sincerely,   True Mar, MD, PhD

## 2024-07-02 ENCOUNTER — Ambulatory Visit (INDEPENDENT_AMBULATORY_CARE_PROVIDER_SITE_OTHER): Admitting: Neurology

## 2024-07-02 DIAGNOSIS — R634 Abnormal weight loss: Secondary | ICD-10-CM

## 2024-07-02 DIAGNOSIS — Z87898 Personal history of other specified conditions: Secondary | ICD-10-CM

## 2024-07-02 DIAGNOSIS — K222 Esophageal obstruction: Secondary | ICD-10-CM

## 2024-07-02 DIAGNOSIS — G4733 Obstructive sleep apnea (adult) (pediatric): Secondary | ICD-10-CM

## 2024-07-02 DIAGNOSIS — F01B0 Vascular dementia, moderate, without behavioral disturbance, psychotic disturbance, mood disturbance, and anxiety: Secondary | ICD-10-CM

## 2024-07-02 DIAGNOSIS — I25119 Atherosclerotic heart disease of native coronary artery with unspecified angina pectoris: Secondary | ICD-10-CM

## 2024-07-02 DIAGNOSIS — Z8719 Personal history of other diseases of the digestive system: Secondary | ICD-10-CM

## 2024-07-02 DIAGNOSIS — Z9189 Other specified personal risk factors, not elsewhere classified: Secondary | ICD-10-CM

## 2024-07-02 DIAGNOSIS — R0683 Snoring: Secondary | ICD-10-CM

## 2024-07-03 NOTE — Progress Notes (Unsigned)
 SABRA

## 2024-07-04 ENCOUNTER — Ambulatory Visit: Payer: Self-pay | Admitting: Neurology

## 2024-07-04 NOTE — Procedures (Signed)
     [facility]  HOME SLEEP TEST (Watch PAT) REPORT  STUDY DATE: 07/02/2024  DOB: 1950/12/04  MRN: [id number]  ORDERING CLINICIAN: True Mar, MD, PhD   REFERRING CLINICIAN: Frann Mabel Mt, DO   CLINICAL INFORMATION/HISTORY (obtained from visit note dated 06/24/2024): ompanied by Alfred Clay today.  As you know, Alfred Clay is a 73 year old male with an underlying medical history of lichen planus, lumbar compression fracture, asthma, coronary artery disease, cardiomyopathy, status post coronary stent placement, skin cancer, cataracts with bilateral surgery, diabetes, reflux disease, history of esophagitis, hyperlipidemia, hypertension, and memory loss, who reports being forgetful and having short-term memory issues.  He has a history of snoring.   BMI: 21.4 kg/m  FINDINGS:   Sleep Summary:   Total Recording Time (hours, min): 5 hours, 14 min  Total Sleep Time (hours, min):  4 hours, 48 min  Percent REM (%):    29.1%   Respiratory Indices:   Calculated pAHI (per hour):  0.4/hour         REM pAHI:    1.5/hour       NREM pAHI: 0/hour  Central pAHI: 0/hour  Oxygen Saturation Statistics:    Oxygen Saturation (%) Mean: 94%   Minimum oxygen saturation (%):                 91%   O2 Saturation Range (%): 91-97%    O2 Saturation (minutes) <=88%: 0 min  Pulse Rate Statistics:   Pulse Mean (bpm):    58/min    Pulse Range (46-83/min)   IMPRESSION:  Primary snoring    RECOMMENDATION:  This home sleep test does not demonstrate any significant obstructive or central sleep disordered breathing with a total AHI of less than 5/hour. Alfred total AHI was 0.4/hour, O2 nadir of 91%.  Snoring was detected, in the mild to moderate range. Treatment with a positive airway pressure device such as AutoPap or CPAP is not indicated based on this test. Snoring may improve with avoidance of the supine sleep position.   For disturbing snoring, an oral appliance  through dentistry or orthodontics can be considered.  Other causes of the patient's symptoms, including circadian rhythm disturbances, an underlying mood disorder, medication effect and/or an underlying medical problem cannot be ruled out based on this test. Clinical correlation is recommended.  The patient should be cautioned not to drive, work at heights, or operate dangerous or heavy equipment when tired or sleepy. Review and reiteration of good sleep hygiene measures should be pursued with any patient. The patient will be advised to follow up with Alfred referring provider, who will be notified of the test results.   I certify that I have reviewed the raw data recording prior to the issuance of this report in accordance with the standards of the American Academy of Sleep Medicine (AASM).    INTERPRETING PHYSICIAN:   True Mar, MD, PhD Medical Director, [facility]) Diplomat, ABPN (Neurology and Sleep)   [facility] [address] [place], [place] [address] [phone]

## 2024-08-19 ENCOUNTER — Other Ambulatory Visit: Payer: Self-pay | Admitting: Student

## 2024-10-09 ENCOUNTER — Encounter: Payer: Self-pay | Admitting: Family Medicine

## 2024-10-09 ENCOUNTER — Ambulatory Visit: Admitting: Family Medicine

## 2024-10-09 ENCOUNTER — Ambulatory Visit: Payer: Self-pay | Admitting: Family Medicine

## 2024-10-09 ENCOUNTER — Other Ambulatory Visit: Payer: Self-pay | Admitting: Family Medicine

## 2024-10-09 VITALS — BP 110/56 | HR 73 | Temp 98.0°F | Resp 16 | Ht 71.0 in | Wt 167.2 lb

## 2024-10-09 DIAGNOSIS — F03B0 Unspecified dementia, moderate, without behavioral disturbance, psychotic disturbance, mood disturbance, and anxiety: Secondary | ICD-10-CM

## 2024-10-09 DIAGNOSIS — E1165 Type 2 diabetes mellitus with hyperglycemia: Secondary | ICD-10-CM | POA: Diagnosis not present

## 2024-10-09 DIAGNOSIS — Z7984 Long term (current) use of oral hypoglycemic drugs: Secondary | ICD-10-CM | POA: Diagnosis not present

## 2024-10-09 LAB — LIPID PANEL
Cholesterol: 200 mg/dL (ref 28–200)
HDL: 56.2 mg/dL
LDL Cholesterol: 133 mg/dL — ABNORMAL HIGH (ref 10–99)
NonHDL: 143.42
Total CHOL/HDL Ratio: 4
Triglycerides: 54 mg/dL (ref 10.0–149.0)
VLDL: 10.8 mg/dL (ref 0.0–40.0)

## 2024-10-09 LAB — HEMOGLOBIN A1C: Hgb A1c MFr Bld: 7.3 % — ABNORMAL HIGH (ref 4.6–6.5)

## 2024-10-09 MED ORDER — ALBUTEROL SULFATE HFA 108 (90 BASE) MCG/ACT IN AERS: 1.0000 | INHALATION_SPRAY | Freq: Four times a day (QID) | RESPIRATORY_TRACT | 1 refills | Status: AC | PRN

## 2024-10-09 MED ORDER — AIRSUPRA 90-80 MCG/ACT IN AERO: 2.0000 | INHALATION_SPRAY | Freq: Four times a day (QID) | RESPIRATORY_TRACT | 2 refills | Status: DC | PRN

## 2024-10-09 NOTE — Patient Instructions (Addendum)
 Give us  2-3 business days to get the results of your labs back.   Keep the diet clean and stay active.  Use the rescue inhaler every 4-6 hours while awake. If tightening up in the next few days, send me a message and I will send in something else.   Let us  know if you need anything.

## 2024-10-09 NOTE — Progress Notes (Signed)
 Subjective:   Chief Complaint  Patient presents with   Follow-up    Follow Up    Alfred Clay is a 74 y.o. male here for follow-up of diabetes.  Here with his wife who helps with the history. Alfred Clay does not routinely ck his sugars.  Patient does not require insulin .   Medications include: Actos  30 mg/d, Metformin  500 mg bid, glyburide  5 mg twice daily Diet is fair.  Exercise: active around house No CP or SOB.   Taking Aricept  10 mg daily and Namenda  5 mg twice daily. No issues, compliant. Memory is stable per his wife.   Past Medical History:  Diagnosis Date   Acquired esophageal ring    Asthma    uses inhaler if needed   CAD (coronary artery disease)    a. s/p NSTEMI in 10/2019 with DES to LAD   Cancer (HCC)    skin- upper back and lower back   Cataract    bilateral-removed   DM type 2 (diabetes mellitus, type 2) (HCC)    GERD (gastroesophageal reflux disease)    on meds   History of esophagitis    Hyperlipidemia    on meds   Hypertension    on meds    Related testing: Retinal exam: Done Pneumovax: done  Objective:  BP (!) 110/56 (BP Location: Left Arm, Patient Position: Sitting)   Pulse 73   Temp 98 F (36.7 C) (Oral)   Resp 16   Ht 5' 11 (1.803 m)   Wt 167 lb 3.2 oz (75.8 kg)   SpO2 97%   BMI 23.32 kg/m  General:  Well developed, well nourished, in no apparent distress Skin:  Warm, no pallor or diaphoresis Lungs: Expiratory wheezes noted at the bases, no access msc use Cardio:  RRR, no bruits, no LE edema Psych: Flat affect  Assessment:   Type 2 diabetes mellitus with hyperglycemia, without long-term current use of insulin  (HCC) - Plan: Lipid panel, Hemoglobin A1c  Moderate dementia without behavioral disturbance, psychotic disturbance, mood disturbance, or anxiety, unspecified dementia type (HCC)   Plan:   Chronic, stable.  Continue Actos  30 mg daily, glyburide  5 mg twice daily, metformin  500 mg twice daily.  Counseled on diet and  exercise. Chronic, stable.  Continue Aricept  10 mg daily, Namenda  5 mg twice daily. Had some wheezing on exam.  Wife reports this happens sometimes when the weather changes.  Airsupra  replacing albuterol .  Use every 4-6 hours while awake and let me know if you are not improving steadily. F/u in 6 mo. The patient and his wife voiced understanding and agreement to the plan.  Mabel Mt Richards, DO 10/09/2024 12:34 PM

## 2024-10-11 ENCOUNTER — Ambulatory Visit

## 2025-01-07 ENCOUNTER — Ambulatory Visit: Admitting: Family Medicine

## 2025-01-15 ENCOUNTER — Ambulatory Visit: Admitting: Adult Health
